# Patient Record
Sex: Female | Born: 1975 | Race: White | Hispanic: No | Marital: Married | Smoking: Former smoker
Health system: Southern US, Community
[De-identification: ages and names within clinical notes are randomized; demographics above are authoritative.]

## PROBLEM LIST (undated history)

## (undated) HISTORY — PX: TUBAL LIGATION: SHX77

## (undated) HISTORY — PX: CARPAL TUNNEL RELEASE: SHX101

## (undated) SURGERY — Surgical Case
Anesthesia: *Unknown

---

## 2019-02-04 ENCOUNTER — Emergency Department
Admission: EM | Admit: 2019-02-04 | Discharge: 2019-02-04 | Disposition: A | Discharge status: Home / Self Care | Payer: Self-pay | Attending: Emergency Medicine | Admitting: Emergency Medicine

## 2019-02-04 ENCOUNTER — Emergency Department: Payer: Self-pay

## 2019-02-04 ENCOUNTER — Other Ambulatory Visit: Payer: Self-pay

## 2019-02-04 DIAGNOSIS — R945 Abnormal results of liver function studies: Secondary | ICD-10-CM | POA: Insufficient documentation

## 2019-02-04 DIAGNOSIS — F17210 Nicotine dependence, cigarettes, uncomplicated: Secondary | ICD-10-CM | POA: Insufficient documentation

## 2019-02-04 DIAGNOSIS — K5900 Constipation, unspecified: Secondary | ICD-10-CM

## 2019-02-04 DIAGNOSIS — R7989 Other specified abnormal findings of blood chemistry: Secondary | ICD-10-CM

## 2019-02-04 LAB — URINE DRUG SCREEN, QUALITATIVE (ARMC ONLY)
Amphetamines, Ur Screen: NOT DETECTED
Barbiturates, Ur Screen: NOT DETECTED
Benzodiazepine, Ur Scrn: NOT DETECTED
Cannabinoid 50 Ng, Ur ~~LOC~~: NOT DETECTED
Cocaine Metabolite,Ur ~~LOC~~: NOT DETECTED
MDMA (Ecstasy)Ur Screen: NOT DETECTED
Methadone Scn, Ur: NOT DETECTED
Opiate, Ur Screen: POSITIVE — AB
Phencyclidine (PCP) Ur S: NOT DETECTED
Tricyclic, Ur Screen: NOT DETECTED

## 2019-02-04 LAB — URINALYSIS, COMPLETE (UACMP) WITH MICROSCOPIC
Bilirubin Urine: NEGATIVE
Glucose, UA: NEGATIVE mg/dL
Hgb urine dipstick: NEGATIVE
Ketones, ur: 5 mg/dL — AB
Nitrite: POSITIVE — AB
Protein, ur: 100 mg/dL — AB
Specific Gravity, Urine: 1.021 (ref 1.005–1.030)
pH: 5 (ref 5.0–8.0)

## 2019-02-04 LAB — CBC WITH DIFFERENTIAL/PLATELET
Abs Immature Granulocytes: 0.01 10*3/uL (ref 0.00–0.07)
Basophils Absolute: 0 10*3/uL (ref 0.0–0.1)
Basophils Relative: 1 %
Eosinophils Absolute: 0 10*3/uL (ref 0.0–0.5)
Eosinophils Relative: 1 %
HCT: 38.2 % (ref 36.0–46.0)
Hemoglobin: 13.6 g/dL (ref 12.0–15.0)
Immature Granulocytes: 0 %
Lymphocytes Relative: 30 %
Lymphs Abs: 1.4 10*3/uL (ref 0.7–4.0)
MCH: 37.9 pg — ABNORMAL HIGH (ref 26.0–34.0)
MCHC: 35.6 g/dL (ref 30.0–36.0)
MCV: 106.4 fL — ABNORMAL HIGH (ref 80.0–100.0)
Monocytes Absolute: 0.2 10*3/uL (ref 0.1–1.0)
Monocytes Relative: 4 %
Neutro Abs: 3.1 10*3/uL (ref 1.7–7.7)
Neutrophils Relative %: 64 %
Platelets: 118 10*3/uL — ABNORMAL LOW (ref 150–400)
RBC: 3.59 MIL/uL — ABNORMAL LOW (ref 3.87–5.11)
RDW: 16.3 % — ABNORMAL HIGH (ref 11.5–15.5)
WBC: 4.8 10*3/uL (ref 4.0–10.5)
nRBC: 0 % (ref 0.0–0.2)

## 2019-02-04 LAB — COMPREHENSIVE METABOLIC PANEL
ALT: 120 U/L — ABNORMAL HIGH (ref 0–44)
AST: 379 U/L — ABNORMAL HIGH (ref 15–41)
Albumin: 4.1 g/dL (ref 3.5–5.0)
Alkaline Phosphatase: 116 U/L (ref 38–126)
Anion gap: 17 — ABNORMAL HIGH (ref 5–15)
BUN: <5 mg/dL — ABNORMAL LOW (ref 6–20)
CO2: 22 mmol/L (ref 22–32)
Calcium: 9.1 mg/dL (ref 8.9–10.3)
Chloride: 96 mmol/L — ABNORMAL LOW (ref 98–111)
Creatinine, Ser: 0.54 mg/dL (ref 0.44–1.00)
GFR calc Af Amer: >60 mL/min (ref >60)
GFR calc non Af Amer: >60 mL/min (ref >60)
Glucose, Bld: 96 mg/dL (ref 70–99)
Potassium: 3.3 mmol/L — ABNORMAL LOW (ref 3.5–5.1)
Sodium: 135 mmol/L (ref 135–145)
Total Bilirubin: 1.5 mg/dL — ABNORMAL HIGH (ref 0.3–1.2)
Total Protein: 7.1 g/dL (ref 6.5–8.1)

## 2019-02-04 LAB — PREGNANCY, URINE: Preg Test, Ur: NEGATIVE

## 2019-02-04 LAB — ETHANOL: Alcohol, Ethyl (B): <10 mg/dL (ref <10)

## 2019-02-04 LAB — LIPASE, BLOOD: Lipase: 29 U/L (ref 11–51)

## 2019-02-04 MED ORDER — POLYETHYLENE GLYCOL 3350 17 G PO PACK: 17.0000 g | PACK | Freq: Every day | ORAL | 0 refills | Status: DC

## 2019-02-04 MED ORDER — SODIUM CHLORIDE 0.9 % IV BOLUS
1000.0000 mL | Freq: Once | INTRAVENOUS | Status: AC
  Administered 2019-02-04: 1000 mL via INTRAVENOUS

## 2019-02-04 MED ORDER — PROMETHAZINE HCL 25 MG/ML IJ SOLN
6.2500 mg | Freq: Once | INTRAMUSCULAR | Status: AC
  Administered 2019-02-04: 6.25 mg via INTRAVENOUS
  Filled 2019-02-04: qty 1

## 2019-02-04 MED ORDER — DOCUSATE SODIUM 100 MG PO CAPS: 100.0000 mg | ORAL_CAPSULE | Freq: Every day | ORAL | 2 refills | Status: DC | PRN

## 2019-02-04 MED ORDER — IOHEXOL 300 MG/ML  SOLN
100.0000 mL | Freq: Once | INTRAMUSCULAR | Status: AC | PRN
  Administered 2019-02-04: 100 mL via INTRAVENOUS

## 2019-02-04 MED ORDER — IOHEXOL 240 MG/ML SOLN
50.0000 mL | Freq: Once | INTRAMUSCULAR | Status: AC
  Administered 2019-02-04: 50 mL via ORAL

## 2019-02-04 MED ORDER — ONDANSETRON HCL 4 MG/2ML IJ SOLN
4.0000 mg | Freq: Once | INTRAMUSCULAR | Status: AC
  Administered 2019-02-04: 4 mg via INTRAVENOUS
  Filled 2019-02-04: qty 2

## 2019-02-04 NOTE — ED Provider Notes (Addendum)
[facility] Emergency Department Provider Note  ____________________________________________   I have reviewed the triage vital signs and the nursing notes. Where available I have reviewed prior notes and, if possible and indicated, outside hospital notes.    HISTORY  Chief Complaint Constipation    HPI Jill Reyes is a 43 y.o. female with a past medical history of C-section x1 and BTL, no other abdominal surgeries, does smoke tobacco, drinks a few times a week, presents today complaining of abdominal pain since Monday.  She states she is been constipated for some time, her last bowel movement on Monday was "hard" however, she has not had a bowel movement since then and she began to have abdominal pain 3 days ago and vomiting last night.  Nonbloody nonbilious vomiting.  She feels this is likely secondary to constipation.  She has not had a fever or chills.  She denies any dysuria or urinary symptoms.  She states she does not have a history of constipation allergies.  She does not take narcotics does not endorse any history of recreational drug use.  No alleviating or aggravating factors.  The abdominal pain is periumbilical infraumbilical, it is a cramping discomfort which is fairly constant for the last couple days.  Nothing makes it better, nothing makes it worse comes and goes on its own, no other alleviating or aggravating factors or prior intervention no history of SBO or colonic obstruction.  No relevant family history patient reports.   History reviewed. No pertinent past medical history.  There are no active problems to display for this patient.   Past Surgical History:  Procedure Laterality Date  . CARPAL TUNNEL RELEASE    . CESAREAN SECTION    . TUBAL LIGATION      Prior to Admission medications   Not on File    Allergies Patient has no known allergies.  No family history on file.  Social History Social History   Tobacco Use  . Smoking  status: Current Every Day Smoker    Types: Cigarettes  . Smokeless tobacco: Never Used  Substance Use Topics  . Alcohol use: Yes  . Drug use: Not Currently    Review of Systems Constitutional: No fever/chills Eyes: No visual changes. ENT: No sore throat. No stiff neck no neck pain Cardiovascular: Denies chest pain. Respiratory: Denies shortness of breath. Gastrointestinal: See HPI genitourinary: Negative for dysuria. Musculoskeletal: Negative lower extremity swelling Skin: Negative for rash. Neurological: Negative for severe headaches, focal weakness or numbness.   ____________________________________________   PHYSICAL EXAM:  VITAL SIGNS: ED Triage Vitals [02/04/19 0709]  Enc Vitals Group     BP (!) 171/116     Pulse Rate (!) 129     Resp 18     Temp 98.4 F (36.9 C)     Temp Source Oral     SpO2 98 %     Weight 153 lb (69.4 kg)     Height 5\' 8"  (1.727 m)     Head Circumference      Peak Flow      Pain Score 7     Pain Loc      Pain Edu?      Excl. in GC?     Constitutional: Alert and oriented. Well appearing and in no acute distress.  Patient resting comfortably in the bed does not appear to be in any distress Eyes: Conjunctivae are normal Head: Atraumatic HEENT: No congestion/rhinnorhea. Mucous membranes are moist.  Oropharynx non-erythematous Neck:  Nontender with no meningismus, no masses, no stridor Cardiovascular: Normal rate, regular rhythm. Grossly normal heart sounds.  Good peripheral circulation. Respiratory: Normal respiratory effort.  No retractions. Lungs CTAB. Abdominal: Soft and umbilical tenderness, mild distention possibly noted not tympanic, soft. No distention. No guarding no rebound Back:  There is no focal tenderness or step off.  there is no midline tenderness there are no lesions noted. there is no CVA tenderness { Musculoskeletal: No lower extremity tenderness, no upper extremity tenderness. No joint effusions, no DVT signs strong  distal pulses no edema Neurologic:  Normal speech and language. No gross focal neurologic deficits are appreciated.  Skin:  Skin is warm, dry and intact. No rash noted. Psychiatric: Mood and affect are normal. Speech and behavior are normal.  ____________________________________________   LABS (all labs ordered are listed, but only abnormal results are displayed)  Labs Reviewed  CBC WITH DIFFERENTIAL/PLATELET  COMPREHENSIVE METABOLIC PANEL  ETHANOL  URINALYSIS, COMPLETE (UACMP) WITH MICROSCOPIC  URINE DRUG SCREEN, QUALITATIVE ([facility] ONLY)  LIPASE, BLOOD  POC URINE PREG, ED    Pertinent labs  results that were available during my care of the patient were reviewed by me and considered in my medical decision making (see chart for details). ____________________________________________  EKG  I personally interpreted any EKGs ordered by me or triage  ____________________________________________  RADIOLOGY  Pertinent labs & imaging results that were available during my care of the patient were reviewed by me and considered in my medical decision making (see chart for details). If possible, patient and/or family made aware of any abnormal findings.  No results found. ____________________________________________    PROCEDURES  Procedure(s) performed: None  Procedures  Critical Care performed: None  ____________________________________________   INITIAL IMPRESSION / ASSESSMENT AND PLAN / ED COURSE  Pertinent labs & imaging results that were available during my care of the patient were reviewed by me and considered in my medical decision making (see chart for details).  Patient here with of abdominal pain, inability to have a bowel movement for 5 days, and now vomiting.  She does have some abdominal tenderness.  Vomiting is certainly not something we generally see in constipation but it certainly can happen.  She is having decreased p.o. as a result of this process.  I am  concerned about the possibility of an SBO, we will obtain imaging and blood work.  ----------------------------------------- 10:25 AM on 02/04/2019 -----------------------------------------  CT scan personally looked at and reviewed, also read radiology review, they do see that she has some questionable inflammation, there is no evidence however of abscess or appendicitis.  She has significant constipation.  We will give her an enema to help her with that.  This is her chief complaint and is the only acute finding really.  She does have low platelets and elevated liver function tests, which I think is more consistent with her chronic alcohol abuse which she states is involves hard liquor and beer on a very regular basis.  I have advised her about seeking help for this.  Her right upper quadrant completely nontender and her real complaint is constipation I do not think she requires further imaging at this time.  We will however refer her to GI for what I suspect is early cirrhotic pathology in her blood work.  Her urine was positive for opiates, which certainly can contribute to constipation.  Initially she told me she did not take any opiates however after being told of her results, she did state that she  had one antiemetic, "Phenergan with codeine" it is not clear the degree to which she uses opiates but it certainly could cause constipation of this Friday and I have advised her to be careful about opiate ingestion.  We will see if she feels better after enema.  There was some inflammation noted on CT scan but I do think antibiotics are indicated at this time.  ----------------------------------------- 11:06 AM on 02/04/2019 -----------------------------------------  Patient had a large and satisfying bowel movement with the assistance of an enema she states she feels "great," and her abdomen is benign.  We will discharge her with close outpatient follow-up and return precautions given and understood.    ____________________________________________   FINAL CLINICAL IMPRESSION(S) / ED DIAGNOSES  Final diagnoses:  None      This chart was dictated using voice recognition software.  Despite best efforts to proofread,  errors can occur which can change meaning.      Schuyler Amor, MD 02/04/19 8242    Schuyler Amor, MD 02/04/19 1027    Schuyler Amor, MD 02/04/19 1027    Schuyler Amor, MD 02/04/19 [phone]

## 2019-02-04 NOTE — ED Notes (Signed)
Pt able to have a medium sized BM and reports feeling much better.

## 2019-02-04 NOTE — ED Triage Notes (Signed)
Pt c/o constipation with feeling full with rectal pain/pressure with nausea and vomiting,. Last BM was Monday. States she has taken a stool softener with no relief.

## 2019-02-04 NOTE — Discharge Instructions (Signed)
We do notice your liver function tests are somewhat elevated here, and your platelets are somewhat low.  We are worried this might be because of your drinking.  We do not suggest to quit drinking cold [place] but we do advise you gradually bring down your alcohol consumption and that you seek help as an outpatient for this as we discussed.  I am referring you to [place] which should help with that.  We are also referring you to GI medicine.  At this time it, is also important to have a primary care doctor please follow-up closely with them as well.  If you have increased pain, fever, vomiting or any other new or worrisome symptoms return to the emergency department.

## 2019-02-07 LAB — URINE CULTURE: Culture: >=100000 — AB

## 2019-12-22 ENCOUNTER — Other Ambulatory Visit: Payer: Self-pay

## 2019-12-22 ENCOUNTER — Inpatient Hospital Stay
Admission: EM | Admit: 2019-12-22 | Discharge: 2019-12-25 | DRG: 854 | Disposition: A | Discharge status: Home / Self Care | Payer: Self-pay | Attending: Internal Medicine | Admitting: Internal Medicine

## 2019-12-22 ENCOUNTER — Emergency Department: Payer: Self-pay

## 2019-12-22 DIAGNOSIS — R000 Tachycardia, unspecified: Secondary | ICD-10-CM | POA: Diagnosis present

## 2019-12-22 DIAGNOSIS — L03317 Cellulitis of buttock: Secondary | ICD-10-CM

## 2019-12-22 DIAGNOSIS — F1010 Alcohol abuse, uncomplicated: Secondary | ICD-10-CM | POA: Diagnosis present

## 2019-12-22 DIAGNOSIS — R630 Anorexia: Secondary | ICD-10-CM | POA: Diagnosis present

## 2019-12-22 DIAGNOSIS — E871 Hypo-osmolality and hyponatremia: Secondary | ICD-10-CM | POA: Diagnosis present

## 2019-12-22 DIAGNOSIS — R6510 Systemic inflammatory response syndrome (SIRS) of non-infectious origin without acute organ dysfunction: Secondary | ICD-10-CM

## 2019-12-22 DIAGNOSIS — D7589 Other specified diseases of blood and blood-forming organs: Secondary | ICD-10-CM | POA: Diagnosis present

## 2019-12-22 DIAGNOSIS — K610 Anal abscess: Secondary | ICD-10-CM | POA: Diagnosis present

## 2019-12-22 DIAGNOSIS — K639 Disease of intestine, unspecified: Secondary | ICD-10-CM

## 2019-12-22 DIAGNOSIS — M479 Spondylosis, unspecified: Secondary | ICD-10-CM | POA: Diagnosis present

## 2019-12-22 DIAGNOSIS — R8271 Bacteriuria: Secondary | ICD-10-CM | POA: Diagnosis present

## 2019-12-22 DIAGNOSIS — A419 Sepsis, unspecified organism: Principal | ICD-10-CM | POA: Diagnosis present

## 2019-12-22 DIAGNOSIS — R7401 Elevation of levels of liver transaminase levels: Secondary | ICD-10-CM | POA: Diagnosis present

## 2019-12-22 DIAGNOSIS — F17210 Nicotine dependence, cigarettes, uncomplicated: Secondary | ICD-10-CM | POA: Diagnosis present

## 2019-12-22 DIAGNOSIS — M4317 Spondylolisthesis, lumbosacral region: Secondary | ICD-10-CM | POA: Diagnosis present

## 2019-12-22 DIAGNOSIS — E538 Deficiency of other specified B group vitamins: Secondary | ICD-10-CM | POA: Diagnosis present

## 2019-12-22 DIAGNOSIS — K643 Fourth degree hemorrhoids: Secondary | ICD-10-CM | POA: Diagnosis present

## 2019-12-22 DIAGNOSIS — Z9851 Tubal ligation status: Secondary | ICD-10-CM

## 2019-12-22 DIAGNOSIS — Z20822 Contact with and (suspected) exposure to covid-19: Secondary | ICD-10-CM | POA: Diagnosis present

## 2019-12-22 DIAGNOSIS — R112 Nausea with vomiting, unspecified: Secondary | ICD-10-CM | POA: Diagnosis present

## 2019-12-22 DIAGNOSIS — E876 Hypokalemia: Secondary | ICD-10-CM | POA: Diagnosis present

## 2019-12-22 DIAGNOSIS — L02215 Cutaneous abscess of perineum: Secondary | ICD-10-CM | POA: Diagnosis present

## 2019-12-22 LAB — CBC WITH DIFFERENTIAL/PLATELET
Abs Immature Granulocytes: 0.03 10*3/uL (ref 0.00–0.07)
Basophils Absolute: 0.1 10*3/uL (ref 0.0–0.1)
Basophils Relative: 0 %
Eosinophils Absolute: 0 10*3/uL (ref 0.0–0.5)
Eosinophils Relative: 0 %
HCT: 37.9 % (ref 36.0–46.0)
Hemoglobin: 13.8 g/dL (ref 12.0–15.0)
Immature Granulocytes: 0 %
Lymphocytes Relative: 23 %
Lymphs Abs: 2.7 10*3/uL (ref 0.7–4.0)
MCH: 36.7 pg — ABNORMAL HIGH (ref 26.0–34.0)
MCHC: 36.4 g/dL — ABNORMAL HIGH (ref 30.0–36.0)
MCV: 100.8 fL — ABNORMAL HIGH (ref 80.0–100.0)
Monocytes Absolute: 0.4 10*3/uL (ref 0.1–1.0)
Monocytes Relative: 4 %
Neutro Abs: 8.5 10*3/uL — ABNORMAL HIGH (ref 1.7–7.7)
Neutrophils Relative %: 73 %
Platelets: 194 10*3/uL (ref 150–400)
RBC: 3.76 MIL/uL — ABNORMAL LOW (ref 3.87–5.11)
RDW: 14.9 % (ref 11.5–15.5)
WBC: 11.7 10*3/uL — ABNORMAL HIGH (ref 4.0–10.5)
nRBC: 0 % (ref 0.0–0.2)

## 2019-12-22 LAB — COMPREHENSIVE METABOLIC PANEL
ALT: 41 U/L (ref 0–44)
AST: 82 U/L — ABNORMAL HIGH (ref 15–41)
Albumin: 3.5 g/dL (ref 3.5–5.0)
Alkaline Phosphatase: 125 U/L (ref 38–126)
Anion gap: 17 — ABNORMAL HIGH (ref 5–15)
BUN: 8 mg/dL (ref 6–20)
CO2: 31 mmol/L (ref 22–32)
Calcium: 9 mg/dL (ref 8.9–10.3)
Chloride: 80 mmol/L — ABNORMAL LOW (ref 98–111)
Creatinine, Ser: 0.44 mg/dL (ref 0.44–1.00)
GFR calc Af Amer: >60 mL/min (ref >60)
GFR calc non Af Amer: >60 mL/min (ref >60)
Glucose, Bld: 100 mg/dL — ABNORMAL HIGH (ref 70–99)
Potassium: 2.4 mmol/L — CL (ref 3.5–5.1)
Sodium: 128 mmol/L — ABNORMAL LOW (ref 135–145)
Total Bilirubin: 1.9 mg/dL — ABNORMAL HIGH (ref 0.3–1.2)
Total Protein: 6.8 g/dL (ref 6.5–8.1)

## 2019-12-22 LAB — LACTIC ACID, PLASMA: Lactic Acid, Venous: 1.7 mmol/L (ref 0.5–1.9)

## 2019-12-22 LAB — MAGNESIUM: Magnesium: 2.1 mg/dL (ref 1.7–2.4)

## 2019-12-22 LAB — URINALYSIS, COMPLETE (UACMP) WITH MICROSCOPIC
Bilirubin Urine: NEGATIVE
Glucose, UA: NEGATIVE mg/dL
Ketones, ur: NEGATIVE mg/dL
Leukocytes,Ua: NEGATIVE
Nitrite: POSITIVE — AB
Protein, ur: NEGATIVE mg/dL
Specific Gravity, Urine: >1.046 — ABNORMAL HIGH (ref 1.005–1.030)
pH: 6 (ref 5.0–8.0)

## 2019-12-22 LAB — HIV ANTIBODY (ROUTINE TESTING W REFLEX): HIV Screen 4th Generation wRfx: NONREACTIVE

## 2019-12-22 MED ORDER — AMOXICILLIN-POT CLAVULANATE 875-125 MG PO TABS
1.0000 | ORAL_TABLET | Freq: Two times a day (BID) | ORAL | Status: DC
  Filled 2019-12-22: qty 1

## 2019-12-22 MED ORDER — VANCOMYCIN HCL IN DEXTROSE 1-5 GM/200ML-% IV SOLN
1000.0000 mg | Freq: Once | INTRAVENOUS | Status: AC
  Administered 2019-12-22: 1000 mg via INTRAVENOUS
  Filled 2019-12-22: qty 200

## 2019-12-22 MED ORDER — POTASSIUM CHLORIDE IN NACL 40-0.9 MEQ/L-% IV SOLN
INTRAVENOUS | Status: DC
  Administered 2019-12-22 – 2019-12-23 (×4): 125 mL/h via INTRAVENOUS
  Filled 2019-12-22 (×7): qty 1000

## 2019-12-22 MED ORDER — LACTATED RINGERS IV BOLUS
1000.0000 mL | Freq: Once | INTRAVENOUS | Status: AC
  Administered 2019-12-22: 1000 mL via INTRAVENOUS

## 2019-12-22 MED ORDER — PIPERACILLIN-TAZOBACTAM 3.375 G IVPB: 3.3750 g | Freq: Three times a day (TID) | INTRAVENOUS | Status: DC

## 2019-12-22 MED ORDER — POTASSIUM CHLORIDE 10 MEQ/100ML IV SOLN
10.0000 meq | INTRAVENOUS | Status: DC
  Administered 2019-12-22 (×2): 10 meq via INTRAVENOUS
  Filled 2019-12-22: qty 100

## 2019-12-22 MED ORDER — ONDANSETRON HCL 4 MG/2ML IJ SOLN: 4.0000 mg | Freq: Four times a day (QID) | INTRAMUSCULAR | Status: DC | PRN

## 2019-12-22 MED ORDER — ONDANSETRON HCL 4 MG/2ML IJ SOLN
4.0000 mg | Freq: Once | INTRAMUSCULAR | Status: AC
  Administered 2019-12-22: 4 mg via INTRAVENOUS
  Filled 2019-12-22 (×2): qty 2

## 2019-12-22 MED ORDER — PIPERACILLIN-TAZOBACTAM 3.375 G IVPB 30 MIN
3.3750 g | Freq: Once | INTRAVENOUS | Status: AC
  Administered 2019-12-22: 3.375 g via INTRAVENOUS
  Filled 2019-12-22: qty 50

## 2019-12-22 MED ORDER — MORPHINE SULFATE (PF) 4 MG/ML IV SOLN
4.0000 mg | INTRAVENOUS | Status: DC | PRN
  Administered 2019-12-22 – 2019-12-23 (×5): 4 mg via INTRAVENOUS
  Filled 2019-12-22 (×5): qty 1

## 2019-12-22 MED ORDER — SODIUM CHLORIDE 0.9 % IV BOLUS
1000.0000 mL | Freq: Once | INTRAVENOUS | Status: AC
  Administered 2019-12-22: 1000 mL via INTRAVENOUS

## 2019-12-22 MED ORDER — SODIUM CHLORIDE 0.9 % IV SOLN: 2.0000 g | Freq: Once | INTRAVENOUS | Status: DC

## 2019-12-22 MED ORDER — OXYCODONE-ACETAMINOPHEN 5-325 MG PO TABS
1.0000 | ORAL_TABLET | ORAL | Status: DC | PRN
  Administered 2019-12-22: 1 via ORAL
  Filled 2019-12-22: qty 1

## 2019-12-22 MED ORDER — IOHEXOL 300 MG/ML  SOLN
100.0000 mL | Freq: Once | INTRAMUSCULAR | Status: AC | PRN
  Administered 2019-12-22: 100 mL via INTRAVENOUS
  Filled 2019-12-22: qty 100

## 2019-12-22 MED ORDER — AMOXICILLIN-POT CLAVULANATE 875-125 MG PO TABS: 1.0000 | ORAL_TABLET | Freq: Two times a day (BID) | ORAL | Status: DC

## 2019-12-22 MED ORDER — ENOXAPARIN SODIUM 40 MG/0.4ML ~~LOC~~ SOLN
40.0000 mg | SUBCUTANEOUS | Status: DC
  Administered 2019-12-22 – 2019-12-23 (×2): 40 mg via SUBCUTANEOUS
  Filled 2019-12-22 (×2): qty 0.4

## 2019-12-22 MED ORDER — NICOTINE 14 MG/24HR TD PT24
14.0000 mg | MEDICATED_PATCH | Freq: Every day | TRANSDERMAL | Status: DC
  Administered 2019-12-22 – 2019-12-25 (×4): 14 mg via TRANSDERMAL
  Filled 2019-12-22 (×4): qty 1

## 2019-12-22 MED ORDER — DIBUCAINE (PERIANAL) 1 % EX OINT
TOPICAL_OINTMENT | Freq: Once | CUTANEOUS | Status: AC
  Administered 2019-12-22: 1 via RECTAL
  Filled 2019-12-22: qty 28

## 2019-12-22 MED ORDER — MAGNESIUM SULFATE 2 GM/50ML IV SOLN
2.0000 g | Freq: Once | INTRAVENOUS | Status: AC
  Administered 2019-12-22: 2 g via INTRAVENOUS
  Filled 2019-12-22: qty 50

## 2019-12-22 NOTE — ED Notes (Signed)
Date and time results received: 12/22/19 5:09 PM  Test: potassium Critical Value: 2.4  Name of Provider Notified: Dr. Roxan Hockey  Orders Received? Or Actions Taken?: no new orders at this time

## 2019-12-22 NOTE — ED Notes (Signed)
Pt was given a meal tray before leaving ED

## 2019-12-22 NOTE — Consult Note (Signed)
PHARMACY -  BRIEF ANTIBIOTIC NOTE   Pharmacy has received consult(s) for vancomycin from an ED provider. The patient's profile has been reviewed for ht/wt/allergies/indication/available labs. NKDA  Patient is a 44 y/o F presenting with concerns for rectal abscess with drainage  One time order(s) placed for  -Vancomycin 1 g (already ordered)  Further antibiotics/pharmacy consults should be ordered by admitting physician if indicated.                       Thank you, Tressie Ellis  Pharmacy Resident 12/22/2019  6:21 PM

## 2019-12-22 NOTE — ED Notes (Signed)
Wallace Cullens top on ice and 1 set of blood cultures sent with labs per EDP.

## 2019-12-22 NOTE — ED Triage Notes (Addendum)
Pt was sent from PCP today with concerns for a rectal abscess , states she has been irritated there for the past 2 months, worse in the past 5 days. States it is draining. Pt has printed lab results from [facility], last name is different from [facility] so the records did not carry over in epic.

## 2019-12-22 NOTE — Consult Note (Deleted)
Pharmacy Antibiotic Note  Jill Reyes is a 44 y.o. female admitted on 12/22/2019 with perianal abscess with drainage.  Pharmacy has been consulted for pip/tazo dosing. Patient also received vancomycin 1 g x 1 in the ED.  WBC 11.7. Afebrile.   Plan: Zosyn 3.375g IV q8h (4 hour infusion).  Height: 5\' 8"  (172.7 cm) Weight: 53.5 kg (118 lb) IBW/kg (Calculated) : 63.9  Temp (24hrs), Avg:98.3 F (36.8 C), Min:98.3 F (36.8 C), Max:98.3 F (36.8 C)  Recent Labs  Lab 12/22/19 1629  WBC 11.7*  CREATININE 0.44  LATICACIDVEN 1.7    Estimated Creatinine Clearance: 77.4 mL/min (by C-G formula based on SCr of 0.44 mg/dL).    No Known Allergies  Antimicrobials this admission: 4/23 Vancomycin 1 g IV x 1 Pip/tazo 4/23 >>   Dose adjustments this admission: n/a  Microbiology results: 4/23 BCx: pending 4/23 UCx: pending   Thank you for allowing pharmacy to be a part of this patient's care.  5/23  Pharmacy Resident 12/22/2019 8:01 PM

## 2019-12-22 NOTE — ED Notes (Signed)
Lab called to add on lactic acid

## 2019-12-22 NOTE — H&P (Signed)
History and Physical    Jill Reyes HDQ:[id number] DOB: Apr 12, 1976 DOA: 12/22/2019  PCP: Patient, No Pcp Per  Patient coming from: home   Chief Complaint: perirectal pain  HPI: Jill Reyes is a 44 y.o. female with medical history significant for alcohol abuse and tobacco abuse presents with above.  First time with this problem. Symptoms began 2 months ago. Inititally intermittent perirectal pain with defecation that came and went. For the past 2 weeks has had constant pain right side of rectum, worse with defecation. Pain has worsened. Over the last several days severe pain with accompanying nausea/vomiting (nbnb). No diarrhea. Has had no appetite and has been eating/drinking little. 2-3 days ago the swollen area near rectum began draining dark bloody/brown fluid. Denies fever. No abd pain. No chest pain or SOB. Gets no regular medical care. Never had a colonoscopy. Smokes 3/4 ppd, drank at least 4 drinks nightly until a week ago.  ED Course: fluids, mg, vanc/zosyn, pain medication, zofran, k 10 meq, CT pelvis.  Review of Systems: As per HPI otherwise 10 point review of systems negative.    History reviewed. No pertinent past medical history.  Past Surgical History:  Procedure Laterality Date  . CARPAL TUNNEL RELEASE    . CESAREAN SECTION    . TUBAL LIGATION       reports that she has been smoking cigarettes. She has never used smokeless tobacco. She reports current alcohol use. She reports previous drug use.  No Known Allergies  No family history on file.  Prior to Admission medications   Not on File    Physical Exam: Vitals:   12/22/19 1440 12/22/19 1441 12/22/19 1506  BP: (!) 148/108  (!) 153/103  Pulse: (!) 140  (!) 116  Resp: 18  14  Temp: 98.3 F (36.8 C)    TempSrc: Oral    SpO2: 99%  100%  Weight:  53.5 kg   Height:  5\' 8"  (1.727 m)     Constitutional: No acute distress Head: Atraumatic Eyes: Conjunctiva clear ENM: Moist mucous membranes. Normal  dentition.  Neck: Supple Respiratory: Clear to auscultation bilaterally, no wheezing/rales/rhonchi. Normal respiratory effort. No accessory muscle use. . Cardiovascular: Regular rate and rhythm. No murmurs/rubs/gallops. Abdomen: Non-tender, non-distended. No masses. No rebound or guarding. Positive bowel sounds. Musculoskeletal: No joint deformity upper and lower extremities. Normal ROM, no contractures. Normal muscle tone.  Skin: No rashes, lesions, or ulcers.  Extremities: No peripheral edema. Palpable peripheral pulses. Neurologic: Alert, moving all 4 extremities. Psychiatric: Normal insight and judgement. Anal: tenderness and induration right ischial area a couple of centimeters from anus, draining. No appreciable fluctuance.   Labs on Admission: I have personally reviewed following labs and imaging studies  CBC: Recent Labs  Lab 12/22/19 1629  WBC 11.7*  NEUTROABS 8.5*  HGB 13.8  HCT 37.9  MCV 100.8*  PLT 194   Basic Metabolic Panel: Recent Labs  Lab 12/22/19 1629  NA 128*  K 2.4*  CL 80*  CO2 31  GLUCOSE 100*  BUN 8  CREATININE 0.44  CALCIUM 9.0  MG 2.1   GFR: Estimated Creatinine Clearance: 77.4 mL/min (by C-G formula based on SCr of 0.44 mg/dL). Liver Function Tests: Recent Labs  Lab 12/22/19 1629  AST 82*  ALT 41  ALKPHOS 125  BILITOT 1.9*  PROT 6.8  ALBUMIN 3.5   No results for input(s): LIPASE, AMYLASE in the last 168 hours. No results for input(s): AMMONIA in the last 168 hours. Coagulation Profile: No  results for input(s): INR, PROTIME in the last 168 hours. Cardiac Enzymes: No results for input(s): CKTOTAL, CKMB, CKMBINDEX, TROPONINI in the last 168 hours. BNP (last 3 results) No results for input(s): PROBNP in the last 8760 hours. HbA1C: No results for input(s): HGBA1C in the last 72 hours. CBG: No results for input(s): GLUCAP in the last 168 hours. Lipid Profile: No results for input(s): CHOL, HDL, LDLCALC, TRIG, CHOLHDL, LDLDIRECT in  the last 72 hours. Thyroid Function Tests: No results for input(s): TSH, T4TOTAL, FREET4, T3FREE, THYROIDAB in the last 72 hours. Anemia Panel: No results for input(s): VITAMINB12, FOLATE, FERRITIN, TIBC, IRON, RETICCTPCT in the last 72 hours. Urine analysis:    Component Value Date/Time   COLORURINE AMBER (A) 12/22/2019 1806   APPEARANCEUR HAZY (A) 12/22/2019 1806   LABSPEC >1.046 (H) 12/22/2019 1806   PHURINE 6.0 12/22/2019 1806   GLUCOSEU NEGATIVE 12/22/2019 1806   HGBUR LARGE (A) 12/22/2019 1806   BILIRUBINUR NEGATIVE 12/22/2019 1806   KETONESUR NEGATIVE 12/22/2019 1806   PROTEINUR NEGATIVE 12/22/2019 1806   NITRITE POSITIVE (A) 12/22/2019 1806   LEUKOCYTESUR NEGATIVE 12/22/2019 1806    Radiological Exams on Admission: CT PELVIS W CONTRAST  Result Date: 12/22/2019 CLINICAL DATA:  Perianal and perirectal pain, concern for rectal abscess EXAM: CT PELVIS WITH CONTRAST TECHNIQUE: Multidetector CT imaging of the pelvis was performed using the standard protocol following the bolus administration of intravenous contrast. CONTRAST:  168mL OMNIPAQUE IOHEXOL 300 MG/ML  SOLN COMPARISON:  02/04/2019 FINDINGS: Urinary Tract: Punctate foci of gas within the bladder lumen may reflect recent catheterization. Visualized portions of the kidneys and ureters are unremarkable. Bowel: No bowel obstruction or ileus. There is chronic nonspecific wall thickening of the ascending colon. This could reflect chronic colitis or scarring from previous bouts of inflammation. Colonoscopy recommended if not recently performed. There is a small focus of enhancement in the right anterolateral perianal soft tissues measuring approximately 1.7 x 0.9 cm, reference image 47. This is just distal to the anal verge. This is compatible with localized phlegmon, without fluid collection or drainable abscess. Vascular/Lymphatic: Minimal atherosclerosis. No pathologic adenopathy. Reproductive:  No mass or other significant abnormality  Other:  No free fluid or free gas. Musculoskeletal: There are no acute or destructive bony lesions. 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 IMPRESSION: 1. Small localized area of enhancement in the right perianal soft tissues consistent with localized phlegmon. No fluid collection or drainable abscess. 2. Chronic wall thickening of the ascending colon and cecum. I suspect chronic inflammation or scarring. Colonoscopy recommended if not previously performed. 3. Punctate foci of gas within the bladder lumen, suspect recent catheterization. Electronically Signed   By: Randa Ngo M.D.   On: 12/22/2019 17:57    EKG: pending  Assessment/Plan Active Problems:   Perianal abscess   Hypokalemia   Hyponatremia   Macrocytosis   Elevated AST (SGOT)   Colon wall thickening   # Perianal abscess - 2 months worsening symptoms, now spontaneously draining. With surrounding cellulitis. Meets sepsis criteria w/ tachycardia, leukocytosis. But hemodynamically stable. CT shows nothing drainable. Receive vanc/zosyn in ED. CT also showing ascending colon wall thickening. Possible Crohn's? - start augmentin - monitor, if no improvement consider gen surg referral for I/D - outpt GI referral for colonoscopy - morphine, zofran - s/p 1 L, IV fluids ordered, tolerating PO  # Bacteriuria - denies cystitis symptoms, suspect this a contaminant from draining perianal abscess -  f/u culture, monitor symptoms  # Hypokalemia - suspect 2/2 vomiting and decreased PO. Asymptomatic - ekg pending, will also place on telemetry - f/u mg level, ed gave 2 mg - 40 meq with fluids @ 125  # Hyponatremia - subacute, 128, normal a year ago. Suspect 2/2 dehydration - IV fluids as above  # macrocytosis  # AST elevation - suspect 2/2 chronic etoh abuse - f/u b12/folate - f/u hbv/hiv/hcv  # Tobacco abuse - patch  DVT prophylaxis: lovenox Code Status:  full  Family Communication: husband John  Disposition Plan: tbd  Consults called: none  Admission status: med/surg    Silvano Bilis MD [facility] Pager [phone]  If 7PM-7AM, please contact night-coverage [email or web address] Password [facility]  12/22/2019, 7:40 PM

## 2019-12-22 NOTE — ED Provider Notes (Signed)
[facility] Emergency Department Provider Note    First MD Initiated Contact with Patient 12/22/19 1622     (approximate)  I have reviewed the triage vital signs and the nursing notes.   HISTORY  Chief Complaint Rectal Pain and Abscess    HPI Jill Reyes is a 44 y.o. female below listed past medical history presents to the ER for evaluation what was initially some irritation around her rectum and noting some intermittent blood location presenting now with worsening severe pain and discomfort.  Not having any fevers.  Denies any dysuria.  Was seen [facility] was sent over to the ER due to concern for perirectal perianal abscess.  No history of abscesses.  No history of diabetes.  Is not been on any antibiotics.    History reviewed. No pertinent past medical history. No family history on file. Past Surgical History:  Procedure Laterality Date  . CARPAL TUNNEL RELEASE    . CESAREAN SECTION    . TUBAL LIGATION     There are no problems to display for this patient.     Prior to Admission medications   Medication Sig Start Date End Date Taking? Authorizing Provider  docusate sodium (COLACE) 100 MG capsule Take 1 capsule (100 mg total) by mouth daily as needed. 02/04/19 02/04/20  Jeanmarie Plant, MD  polyethylene glycol (MIRALAX) 17 g packet Take 17 g by mouth daily. 02/04/19   Jeanmarie Plant, MD    Allergies Patient has no known allergies.    Social History Social History   Tobacco Use  . Smoking status: Current Every Day Smoker    Types: Cigarettes  . Smokeless tobacco: Never Used  Substance Use Topics  . Alcohol use: Yes  . Drug use: Not Currently    Review of Systems Patient denies headaches, rhinorrhea, blurry vision, numbness, shortness of breath, chest pain, edema, cough, abdominal pain, nausea, vomiting, diarrhea, dysuria, fevers, rashes or hallucinations unless otherwise stated above in  HPI. ____________________________________________   PHYSICAL EXAM:  VITAL SIGNS: Vitals:   12/22/19 1440 12/22/19 1506  BP: (!) 148/108 (!) 153/103  Pulse: (!) 140 (!) 116  Resp: 18 14  Temp: 98.3 F (36.8 C)   SpO2: 99% 100%    Constitutional: Alert and oriented.  Eyes: Conjunctivae are normal.  Head: Atraumatic. Nose: No congestion/rhinnorhea. Mouth/Throat: Mucous membranes are moist.   Neck: No stridor. Painless ROM.  Cardiovascular: Normal rate, regular rhythm. Grossly normal heart sounds.  Good peripheral circulation. Respiratory: Normal respiratory effort.  No retractions. Lungs CTAB. Gastrointestinal: Soft and nontender. No distention. No abdominal bruits. No CVA tenderness. Genitourinary: There is.  Rectal tenderness but no appreciate any significant fluctuance.  There is an area of surrounding cellulitis around 1/2 cm area that is draining purulent fluid of the left perianal area.  No surrounding crepitus. no vaginal discharge.  There are nonthrombosed but tender hemorrhoids noted. Musculoskeletal: No lower extremity tenderness nor edema.  No joint effusions. Neurologic:  Normal speech and language. No gross focal neurologic deficits are appreciated. No facial droop Skin:  Skin is warm, dry and intact. No rash noted. Psychiatric: Mood and affect are normal. Speech and behavior are normal.  ____________________________________________   LABS (all labs ordered are listed, but only abnormal results are displayed)  Results for orders placed or performed during the hospital encounter of 12/22/19 (from the past 24 hour(s))  CBC with Differential/Platelet     Status: Abnormal   Collection Time: 12/22/19  4:29 PM  Result Value Ref Range   WBC 11.7 (H) 4.0 - 10.5 K/uL   RBC 3.76 (L) 3.87 - 5.11 MIL/uL   Hemoglobin 13.8 12.0 - 15.0 g/dL   HCT 33.2 95.1 - 88.4 %   MCV 100.8 (H) 80.0 - 100.0 fL   MCH 36.7 (H) 26.0 - 34.0 pg   MCHC 36.4 (H) 30.0 - 36.0 g/dL   RDW 16.6 06.3  - 01.6 %   Platelets 194 150 - 400 K/uL   nRBC 0.0 0.0 - 0.2 %   Neutrophils Relative % 73 %   Neutro Abs 8.5 (H) 1.7 - 7.7 K/uL   Lymphocytes Relative 23 %   Lymphs Abs 2.7 0.7 - 4.0 K/uL   Monocytes Relative 4 %   Monocytes Absolute 0.4 0.1 - 1.0 K/uL   Eosinophils Relative 0 %   Eosinophils Absolute 0.0 0.0 - 0.5 K/uL   Basophils Relative 0 %   Basophils Absolute 0.1 0.0 - 0.1 K/uL   Immature Granulocytes 0 %   Abs Immature Granulocytes 0.03 0.00 - 0.07 K/uL  Comprehensive metabolic panel     Status: Abnormal   Collection Time: 12/22/19  4:29 PM  Result Value Ref Range   Sodium 128 (L) 135 - 145 mmol/L   Potassium 2.4 (LL) 3.5 - 5.1 mmol/L   Chloride 80 (L) 98 - 111 mmol/L   CO2 31 22 - 32 mmol/L   Glucose, Bld 100 (H) 70 - 99 mg/dL   BUN 8 6 - 20 mg/dL   Creatinine, Ser 0.10 0.44 - 1.00 mg/dL   Calcium 9.0 8.9 - 93.2 mg/dL   Total Protein 6.8 6.5 - 8.1 g/dL   Albumin 3.5 3.5 - 5.0 g/dL   AST 82 (H) 15 - 41 U/L   ALT 41 0 - 44 U/L   Alkaline Phosphatase 125 38 - 126 U/L   Total Bilirubin 1.9 (H) 0.3 - 1.2 mg/dL   GFR calc non Af Amer >60 >60 mL/min   GFR calc Af Amer >60 >60 mL/min   Anion gap 17 (H) 5 - 15  Lactic acid, plasma     Status: None   Collection Time: 12/22/19  4:29 PM  Result Value Ref Range   Lactic Acid, Venous 1.7 0.5 - 1.9 mmol/L  Urinalysis, Complete w Microscopic     Status: Abnormal   Collection Time: 12/22/19  6:06 PM  Result Value Ref Range   Color, Urine AMBER (A) YELLOW   APPearance HAZY (A) CLEAR   Specific Gravity, Urine >1.046 (H) 1.005 - 1.030   pH 6.0 5.0 - 8.0   Glucose, UA NEGATIVE NEGATIVE mg/dL   Hgb urine dipstick LARGE (A) NEGATIVE   Bilirubin Urine NEGATIVE NEGATIVE   Ketones, ur NEGATIVE NEGATIVE mg/dL   Protein, ur NEGATIVE NEGATIVE mg/dL   Nitrite POSITIVE (A) NEGATIVE   Leukocytes,Ua NEGATIVE NEGATIVE   RBC / HPF 0-5 0 - 5 RBC/hpf   WBC, UA 0-5 0 - 5 WBC/hpf   Bacteria, UA MANY (A) NONE SEEN   Squamous Epithelial / LPF  0-5 0 - 5   Mucus PRESENT    ____________________________________________ ____________________________________________  RADIOLOGY  I personally reviewed all radiographic images ordered to evaluate for the above acute complaints and reviewed radiology reports and findings.  These findings were personally discussed with the patient.  Please see medical record for radiology report.  ____________________________________________   PROCEDURES  Procedure(s) performed:  Procedures    Critical Care performed: no ____________________________________________   INITIAL IMPRESSION /  ASSESSMENT AND PLAN / ED COURSE  Pertinent labs & imaging results that were available during my care of the patient were reviewed by me and considered in my medical decision making (see chart for details).   DDX:.  Perirectal abscess  Anal abscess, fistula, cellulitis, neck fashion, Fournier's gangrene, sepsis   DALA BREAULT is a 44 y.o. who presents to the ED with symptoms as described above.  Patient tachycardic not febrile but very uncomfortable appearing.  Exam as above.  Somewhat concerning for abscess cellulitis or deep space infection.  Will order CT imaging.  Will order IV fluids as well as IV pain medication.  Clinical Course as of Dec 22 1854  Fri Dec 22, 2019  1844 On examination she does have a crepitus in the area.  It is actively draining.  Per CT there is no evidence of deep space infection or abscess.  There does appear to be cellulitis and phlegmon.  Patient has been started night IV antibiotics.  She does have a low-grade temperature white count and tachycardia therefore she did receive IV antibiotics.  Cultures have been ordered and sent.  Fortunately her lactate is normal.  Does appear to be significantly dehydrated with spec gravity at > 1.046.  Given her hypokalemia hyponatremia tachycardia and presentation I do think that she requires hospitalization for IV medications and reassessment.   Will discuss with hospitalist   [PR]    Clinical Course User Index [PR] Merlyn Lot, MD    The patient was evaluated in Emergency Department today for the symptoms described in the history of present illness. He/she was evaluated in the context of the global COVID-19 pandemic, which necessitated consideration that the patient might be at risk for infection with the SARS-CoV-2 virus that causes COVID-19. Institutional protocols and algorithms that pertain to the evaluation of patients at risk for COVID-19 are in a state of rapid change based on information released by regulatory bodies including the CDC and federal and state organizations. These policies and algorithms were followed during the patient's care in the ED.  As part of my medical decision making, I reviewed the following data within the [facility] notes reviewed and incorporated, Labs reviewed, notes from prior ED visits and [place] Controlled Substance Database   ____________________________________________   FINAL CLINICAL IMPRESSION(S) / ED DIAGNOSES  Final diagnoses:  Perianal abscess  Cellulitis of buttock  Hyponatremia  Hypokalemia  SIRS (systemic inflammatory response syndrome) ([place])      NEW MEDICATIONS STARTED DURING THIS VISIT:  New Prescriptions   No medications on file     Note:  This document was prepared using Dragon voice recognition software and may include unintentional dictation errors.    Merlyn Lot, MD 12/22/19 [phone]

## 2019-12-23 DIAGNOSIS — E871 Hypo-osmolality and hyponatremia: Secondary | ICD-10-CM

## 2019-12-23 DIAGNOSIS — E876 Hypokalemia: Secondary | ICD-10-CM

## 2019-12-23 DIAGNOSIS — K610 Anal abscess: Secondary | ICD-10-CM

## 2019-12-23 LAB — COMPREHENSIVE METABOLIC PANEL
ALT: 32 U/L (ref 0–44)
AST: 71 U/L — ABNORMAL HIGH (ref 15–41)
Albumin: 2.6 g/dL — ABNORMAL LOW (ref 3.5–5.0)
Alkaline Phosphatase: 104 U/L (ref 38–126)
Anion gap: 10 (ref 5–15)
BUN: <5 mg/dL — ABNORMAL LOW (ref 6–20)
CO2: 29 mmol/L (ref 22–32)
Calcium: 7.7 mg/dL — ABNORMAL LOW (ref 8.9–10.3)
Chloride: 90 mmol/L — ABNORMAL LOW (ref 98–111)
Creatinine, Ser: 0.42 mg/dL — ABNORMAL LOW (ref 0.44–1.00)
GFR calc Af Amer: >60 mL/min (ref >60)
GFR calc non Af Amer: >60 mL/min (ref >60)
Glucose, Bld: 98 mg/dL (ref 70–99)
Potassium: 2.9 mmol/L — ABNORMAL LOW (ref 3.5–5.1)
Sodium: 129 mmol/L — ABNORMAL LOW (ref 135–145)
Total Bilirubin: 1.5 mg/dL — ABNORMAL HIGH (ref 0.3–1.2)
Total Protein: 5.3 g/dL — ABNORMAL LOW (ref 6.5–8.1)

## 2019-12-23 LAB — CBC
HCT: 31.8 % — ABNORMAL LOW (ref 36.0–46.0)
Hemoglobin: 11.2 g/dL — ABNORMAL LOW (ref 12.0–15.0)
MCH: 37.7 pg — ABNORMAL HIGH (ref 26.0–34.0)
MCHC: 35.2 g/dL (ref 30.0–36.0)
MCV: 107.1 fL — ABNORMAL HIGH (ref 80.0–100.0)
Platelets: 146 10*3/uL — ABNORMAL LOW (ref 150–400)
RBC: 2.97 MIL/uL — ABNORMAL LOW (ref 3.87–5.11)
RDW: 15.2 % (ref 11.5–15.5)
WBC: 8.6 10*3/uL (ref 4.0–10.5)
nRBC: 0 % (ref 0.0–0.2)

## 2019-12-23 LAB — SARS CORONAVIRUS 2 (TAT 6-24 HRS): SARS Coronavirus 2: NEGATIVE

## 2019-12-23 LAB — TSH: TSH: 1.864 u[IU]/mL (ref 0.350–4.500)

## 2019-12-23 LAB — SODIUM: Sodium: 133 mmol/L — ABNORMAL LOW (ref 135–145)

## 2019-12-23 LAB — POTASSIUM: Potassium: 4.1 mmol/L (ref 3.5–5.1)

## 2019-12-23 LAB — OSMOLALITY, URINE: Osmolality, Ur: 313 mOsm/kg (ref 300–900)

## 2019-12-23 LAB — CORTISOL: Cortisol, Plasma: 13.4 ug/dL

## 2019-12-23 LAB — OSMOLALITY: Osmolality: 269 mOsm/kg — ABNORMAL LOW (ref 275–295)

## 2019-12-23 LAB — MAGNESIUM: Magnesium: 2.8 mg/dL — ABNORMAL HIGH (ref 1.7–2.4)

## 2019-12-23 LAB — VITAMIN B12: Vitamin B-12: 839 pg/mL (ref 180–914)

## 2019-12-23 LAB — HEPATITIS B SURFACE ANTIGEN: Hepatitis B Surface Ag: NONREACTIVE

## 2019-12-23 LAB — HEMOGLOBIN A1C
Hgb A1c MFr Bld: 4.6 % — ABNORMAL LOW (ref 4.8–5.6)
Mean Plasma Glucose: 85.32 mg/dL

## 2019-12-23 LAB — SODIUM, URINE, RANDOM: Sodium, Ur: 42 mmol/L

## 2019-12-23 LAB — FOLATE: Folate: 1.8 ng/mL — ABNORMAL LOW (ref >5.9)

## 2019-12-23 MED ORDER — POTASSIUM CHLORIDE CRYS ER 20 MEQ PO TBCR: 40.0000 meq | EXTENDED_RELEASE_TABLET | ORAL | Status: DC

## 2019-12-23 MED ORDER — BISACODYL 5 MG PO TBEC: 5.0000 mg | DELAYED_RELEASE_TABLET | Freq: Every day | ORAL | Status: DC | PRN

## 2019-12-23 MED ORDER — OXYCODONE HCL 5 MG PO TABS: 5.0000 mg | ORAL_TABLET | ORAL | Status: DC | PRN

## 2019-12-23 MED ORDER — POTASSIUM CHLORIDE CRYS ER 20 MEQ PO TBCR
40.0000 meq | EXTENDED_RELEASE_TABLET | Freq: Once | ORAL | Status: AC
  Administered 2019-12-23: 40 meq via ORAL
  Filled 2019-12-23: qty 2

## 2019-12-23 MED ORDER — SENNA 8.6 MG PO TABS
1.0000 | ORAL_TABLET | Freq: Every day | ORAL | Status: DC | PRN
  Administered 2019-12-23 – 2019-12-24 (×2): 8.6 mg via ORAL
  Filled 2019-12-23 (×2): qty 1

## 2019-12-23 MED ORDER — POTASSIUM CHLORIDE CRYS ER 20 MEQ PO TBCR
40.0000 meq | EXTENDED_RELEASE_TABLET | ORAL | Status: AC
  Administered 2019-12-23 (×2): 40 meq via ORAL
  Filled 2019-12-23 (×2): qty 2

## 2019-12-23 MED ORDER — FOLIC ACID 1 MG PO TABS
1.0000 mg | ORAL_TABLET | Freq: Every day | ORAL | Status: DC
  Administered 2019-12-23 – 2019-12-25 (×3): 1 mg via ORAL
  Filled 2019-12-23 (×3): qty 1

## 2019-12-23 MED ORDER — SODIUM CHLORIDE 0.9 % IV SOLN
1.0000 g | INTRAVENOUS | Status: DC
  Administered 2019-12-23 – 2019-12-24 (×2): 1 g via INTRAVENOUS
  Filled 2019-12-23: qty 10
  Filled 2019-12-23: qty 1

## 2019-12-23 MED ORDER — OXYCODONE HCL 5 MG PO TABS
10.0000 mg | ORAL_TABLET | ORAL | Status: DC | PRN
  Administered 2019-12-23 – 2019-12-25 (×12): 10 mg via ORAL
  Filled 2019-12-23 (×12): qty 2

## 2019-12-23 MED ORDER — MAGNESIUM CITRATE PO SOLN
1.0000 | Freq: Once | ORAL | Status: DC | PRN
  Filled 2019-12-23 (×3): qty 296

## 2019-12-23 MED ORDER — MORPHINE SULFATE (PF) 4 MG/ML IV SOLN
4.0000 mg | INTRAVENOUS | Status: DC | PRN
  Administered 2019-12-24 – 2019-12-25 (×4): 4 mg via INTRAVENOUS
  Filled 2019-12-23 (×5): qty 1

## 2019-12-23 MED ORDER — ALUM & MAG HYDROXIDE-SIMETH 200-200-20 MG/5ML PO SUSP
30.0000 mL | Freq: Four times a day (QID) | ORAL | Status: DC | PRN
  Administered 2019-12-23 – 2019-12-24 (×2): 30 mL via ORAL
  Filled 2019-12-23 (×2): qty 30

## 2019-12-23 NOTE — Progress Notes (Addendum)
PROGRESS NOTE    Jill Reyes   VOH:[id number]  DOB: 08/27/1976  PCP: Patient, No Pcp Per    DOA: 12/22/2019 LOS: 1   Brief Narrative   Jill Reyes is a 44 y.o. female with medical history significant for alcohol abuse and tobacco abuse presented to the ED on 12/22/19 with worsening perianal pain that started two months prior and progressively worsened over two weeks until quite severe.  Associated nausea/vomiting due to pain, poor appetite, localized swelling and drainage of dark bloody/brown fluid.  No fever/chills.  Does not follow with healthcare providers.  In the ED, hypertensive 148/108 and tachycardic HR 140 initially (improved to 116), other vitals stable.  Labs notable for hyponatremia 128, hypokalemia 2.4, AST 82, Tbili 1.9, WBC's 11.7k, macrocytosis.  UA negative for infection.  CT pelvis showed a small localized area of enhancement in the right perianal soft tissues consistent with localized phlegmon. No fluid collection or drainable abscess, chronic wall thickening of the ascending colon and cecum (suspected to be chronic inflammation or scarring, colonoscopy recommended if not previously performed).  She was treated with broad spectrum antiobiotcs, K replaced.  Admitted to hospitalist service for perianal cellulitis with possible abscess.     Assessment & Plan   Principal Problem:   Perianal abscess Active Problems:   Hypokalemia   Hyponatremia   Colon wall thickening   Macrocytosis   Elevated AST (SGOT)  Sepsis secondary Perianal cellulitis with ?abscess - present on admission, sepsis as evidenced by leukocytosis and tachycardia in setting of cellulitis/abscess.  Normal lactate.  Fluids and broad spectrum antibiotics started in ED.  Vitals now stable.  Leukocytosis resolved. --continue IV Rocephin --possibly can transition to PO tomorrow depending on improvement --monitor area  --will consider general surgery consult if not improving, to consider I&D attempt (no  fluid collection seen on CT). --pain control per orders, stool softeners --cleanse area with warm soap and water at least twice daily --follow up on cultures  Hypokalemia - present on admission K, 2.4.  Replaced & resolved. --monitor BMP  Hypotonic Hyponatremia - present on admission Na 128 (normal a year ago).  Normal TSH rules out hypothyroidism.  Cortisol pending to check adrenals.  Hypotonic serum, hypertonic urine and high urine sodium.  Differential includes SIADH, stress, drugs.  Less likely beer potomania as urine sodium high. --repeat Na pending for afternoon, likely will stop fluids and restrict fluids vs give Lasix --BMP in AM  Asymptomatic Bacteruria - no UTI symptoms, will defer antibiotics.  Follow urine culture. Monitor for symptoms.  Colon wall thickening - seen on CT pelvis on admission. Outpatient follow-up with GI for colonoscopy recommended if not done previously.   Macrocytosis - without anemia.  Likely due to EtOH.  B12 level normal.  Folate pending.  Monitor CBC.  Elevated AST - 82 on admission, suspect due to EtOH.  Monitor.  Tobacco abuse - nicotine patch.  Counseled patient on importance of smoking cessation.  Patient BMI: Body mass index is 18.54 kg/m.   DVT prophylaxis: Lovenox  Diet:  Diet Orders (From admission, onward)    Start     Ordered   12/22/19 2038  Diet regular Room service appropriate? Yes; Fluid consistency: Thin  Diet effective now    Question Answer Comment  Room service appropriate? Yes   Fluid consistency: Thin      12/22/19 2038            Code Status: Full Code    Subjective 12/23/19  Patient seen with significant other at bedside this AM.  She reports feeling better since admission.  Still has a lot of pain, but has been able to get more comfortable and pain medicine helping.  Denies fever/chills.   Disposition Plan & Communication   Dispo & Barriers: d/c home pending further clinical improvement and transition to PO  antibiotics.  Possible surgery evaluation for ?I&D if not improving. Coming from: home Exp d/c date: 4/25 Medically stable for d/c? no  Family Communication: signficant other at bedside during encounter    Consults, Procedures, Significant Events   Consultants:   none  Procedures:   none  Antimicrobials:   Vanc/Zosyn given in the ED 4/23  Rocephin 4/24 >>   Objective   Vitals:   12/23/19 0019 12/23/19 0433 12/23/19 0927 12/23/19 1226  BP: 127/87 (!) 129/99 (!) 119/91 (!) 139/100  Pulse: 91 85 92 84  Resp: 16 18 18 18   Temp: 97.7 F (36.5 C) 97.9 F (36.6 C) 98.1 F (36.7 C) 97.9 F (36.6 C)  TempSrc: Oral Oral Oral Oral  SpO2: 100% 100% 100% 100%  Weight:      Height:        Intake/Output Summary (Last 24 hours) at 12/23/2019 1353 Last data filed at 12/23/2019 0947 Gross per 24 hour  Intake 672.1 ml  Output 2100 ml  Net -1427.9 ml   Filed Weights   12/22/19 1441 12/22/19 2038  Weight: 53.5 kg 55.3 kg    Physical Exam:  General exam: awake, alert, no acute distress HEENT: moist mucus membranes, hearing grossly normal  Respiratory system: CTAB, no wheezes, rales or rhonchi, normal respiratory effort. Cardiovascular system: normal S1/S2, RRR, no JVD, murmurs, rubs, gallops, no pedal edema.   Gastrointestinal system: soft, NT, ND, rectal exam deferred per patient request. Central nervous system: A&O x4. no gross focal neurologic deficits, normal speech Extremities: moves all, no edema, normal tone Skin: dry, intact, normal temperature, normal color Psychiatry: normal mood, congruent affect, judgement and insight appear normal  Labs   Data Reviewed: I have personally reviewed following labs and imaging studies  CBC: Recent Labs  Lab 12/22/19 1629 12/22/19 2045 12/23/19 0422  WBC 11.7* 12.7* 8.6  NEUTROABS 8.5*  --   --   HGB 13.8 12.4 11.2*  HCT 37.9 35.5* 31.8*  MCV 100.8* 105.0* 107.1*  PLT 194 160 146*   Basic Metabolic Panel: Recent Labs   Lab 12/22/19 1629 12/22/19 2045 12/23/19 0422 12/23/19 1320  NA 128*  --  129*  --   K 2.4*  --  2.9* 4.1  CL 80*  --  90*  --   CO2 31  --  29  --   GLUCOSE 100*  --  98  --   BUN 8  --  <5*  --   CREATININE 0.44 0.44 0.42*  --   CALCIUM 9.0  --  7.7*  --   MG 2.1  --  2.8*  --    GFR: Estimated Creatinine Clearance: 80 mL/min (A) (by C-G formula based on SCr of 0.42 mg/dL (L)). Liver Function Tests: Recent Labs  Lab 12/22/19 1629 12/23/19 0422  AST 82* 71*  ALT 41 32  ALKPHOS 125 104  BILITOT 1.9* 1.5*  PROT 6.8 5.3*  ALBUMIN 3.5 2.6*   No results for input(s): LIPASE, AMYLASE in the last 168 hours. No results for input(s): AMMONIA in the last 168 hours. Coagulation Profile: No results for input(s): INR, PROTIME in the last 168 hours.  Cardiac Enzymes: No results for input(s): CKTOTAL, CKMB, CKMBINDEX, TROPONINI in the last 168 hours. BNP (last 3 results) No results for input(s): PROBNP in the last 8760 hours. HbA1C: Recent Labs    12/23/19 0422  HGBA1C 4.6*   CBG: No results for input(s): GLUCAP in the last 168 hours. Lipid Profile: No results for input(s): CHOL, HDL, LDLCALC, TRIG, CHOLHDL, LDLDIRECT in the last 72 hours. Thyroid Function Tests: Recent Labs    12/23/19 1043  TSH 1.864   Anemia Panel: Recent Labs    12/23/19 0422  VITAMINB12 839  FOLATE 1.8*   Sepsis Labs: Recent Labs  Lab 12/22/19 1629  LATICACIDVEN 1.7    Recent Results (from the past 240 hour(s))  Blood culture (routine x 2)     Status: None (Preliminary result)   Collection Time: 12/22/19  4:29 PM   Specimen: BLOOD  Result Value Ref Range Status   Specimen Description BLOOD LEFT FA  Final   Special Requests   Final    BOTTLES DRAWN AEROBIC AND ANAEROBIC Blood Culture results may not be optimal due to an excessive volume of blood received in culture bottles   Culture   Final    NO GROWTH < 12 HOURS Performed at [facility], [address]  [place], [place] [address]    Report Status PENDING  Incomplete  Blood culture (routine x 2)     Status: None (Preliminary result)   Collection Time: 12/22/19  7:37 PM   Specimen: BLOOD  Result Value Ref Range Status   Specimen Description BLOOD RIGHT ASSIST CONTROL  Final   Special Requests   Final    BOTTLES DRAWN AEROBIC AND ANAEROBIC Blood Culture results may not be optimal due to an inadequate volume of blood received in culture bottles   Culture   Final    NO GROWTH < 12 HOURS Performed at [facility], [address] [place], [place] [address]    Report Status PENDING  Incomplete  SARS CORONAVIRUS 2 (TAT 6-24 HRS) Nasopharyngeal Nasopharyngeal Swab     Status: None   Collection Time: 12/22/19  7:37 PM   Specimen: Nasopharyngeal Swab  Result Value Ref Range Status   SARS Coronavirus 2 NEGATIVE NEGATIVE Final    Comment: (NOTE) SARS-CoV-2 target nucleic acids are NOT DETECTED. The SARS-CoV-2 RNA is generally detectable in upper and lower respiratory specimens during the acute phase of infection. Negative results do not preclude SARS-CoV-2 infection, do not rule out co-infections with other pathogens, and should not be used as the sole basis for treatment or other patient management decisions. Negative results must be combined with clinical observations, patient history, and epidemiological information. The expected result is Negative. Fact Sheet for Patients: HairSlick.no Fact Sheet for Healthcare Providers: quierodirigir.com This test is not yet approved or cleared by the [place] FDA and  has been authorized for detection and/or diagnosis of SARS-CoV-2 by FDA under an Emergency Use Authorization (EUA). This EUA will remain  in effect (meaning this test can be used) for the duration of the COVID-19 declaration under Section 56 4(b)(1) of the Act, 21 U.S.C. section 360bbb-3(b)(1), unless the authorization is  terminated or revoked sooner. Performed at [facility], [address], [place], [place] [address]       Imaging Studies   CT PELVIS W CONTRAST  Result Date: 12/22/2019 CLINICAL DATA:  Perianal and perirectal pain, concern for rectal abscess EXAM: CT PELVIS WITH CONTRAST TECHNIQUE: Multidetector CT imaging of the pelvis  was performed using the standard protocol following the bolus administration of intravenous contrast. CONTRAST:  OMNIPAQUE IOHEXOL 300 MG/ML  SOLN COMPARISON:  02/04/2019 FINDINGS: Urinary Tract: Punctate foci of gas within the bladder lumen may reflect recent catheterization. Visualized portions of the kidneys and ureters are unremarkable. Bowel: No bowel obstruction or ileus. There is chronic nonspecific wall thickening of the ascending colon. This could reflect chronic colitis or scarring from previous bouts of inflammation. Colonoscopy recommended if not recently performed. There is a small focus of enhancement in the right anterolateral perianal soft tissues measuring approximately 1.7 x 0.9 cm, reference image 47. This is just distal to the anal verge. This is compatible with localized phlegmon, without fluid collection or drainable abscess. Vascular/Lymphatic: Minimal atherosclerosis. No pathologic adenopathy. Reproductive:  No mass or other significant abnormality Other:  No free fluid or free gas. Musculoskeletal: There are no acute or destructive bony lesions. 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 IMPRESSION: 1. Small localized area of enhancement in the right perianal soft tissues consistent with localized phlegmon. No fluid collection or drainable abscess. 2. Chronic wall thickening of the ascending colon and cecum. I suspect chronic inflammation or scarring. Colonoscopy recommended if not previously performed. 3. Punctate foci of gas within the bladder lumen, suspect recent  catheterization. Electronically Signed   By: Sharlet Salina M.D.   On: 12/22/2019 17:57     Medications   Scheduled Meds: . enoxaparin (LOVENOX) injection  40 mg Subcutaneous Q24H  . folic acid  1 mg Oral Daily  . nicotine  14 mg Transdermal Daily   Continuous Infusions: . 0.9 % NaCl with KCl 40 mEq / L 125 mL/hr (12/23/19 0738)  . cefTRIAXone (ROCEPHIN)  IV 1 g (12/23/19 1018)       LOS: 1 day    Time spent: 30 minutes    Pennie Banter, DO [facility]   If 7PM-7AM, please contact night-coverage [email or web address] 12/23/2019, 1:53 PM

## 2019-12-23 NOTE — Hospital Course (Signed)
Jill Reyes is a 44 y.o. female with medical history significant for alcohol abuse and tobacco abuse presented to the ED on 12/22/19 with worsening perianal pain that started two months prior and progressively worsened over two weeks until quite severe.  Associated nausea/vomiting due to pain, poor appetite, localized swelling and drainage of dark bloody/brown fluid.  No fever/chills.  Does not follow with healthcare providers.  In the ED, hypertensive 148/108 and tachycardic HR 140 initially (improved to 116), other vitals stable.  Labs notable for hyponatremia 128, hypokalemia 2.4, AST 82, Tbili 1.9, WBC's 11.7k, macrocytosis.  UA negative for infection.  CT pelvis showed a small localized area of enhancement in the right perianal soft tissues consistent with localized phlegmon. No fluid collection or drainable abscess, chronic wall thickening of the ascending colon and cecum (suspected to be chronic inflammation or scarring, colonoscopy recommended if not previously performed).  She was treated with broad spectrum antiobiotcs, K replaced.  Admitted to hospitalist service for perianal cellulitis with possible abscess.

## 2019-12-24 DIAGNOSIS — E538 Deficiency of other specified B group vitamins: Secondary | ICD-10-CM | POA: Diagnosis present

## 2019-12-24 LAB — CBC WITH DIFFERENTIAL/PLATELET
Abs Immature Granulocytes: 0.05 10*3/uL (ref 0.00–0.07)
Basophils Absolute: 0 10*3/uL (ref 0.0–0.1)
Basophils Relative: 0 %
Eosinophils Absolute: 0.1 10*3/uL (ref 0.0–0.5)
Eosinophils Relative: 1 %
HCT: 35.6 % — ABNORMAL LOW (ref 36.0–46.0)
Hemoglobin: 11.9 g/dL — ABNORMAL LOW (ref 12.0–15.0)
Immature Granulocytes: 1 %
Lymphocytes Relative: 23 %
Lymphs Abs: 2 10*3/uL (ref 0.7–4.0)
MCH: 37.1 pg — ABNORMAL HIGH (ref 26.0–34.0)
MCHC: 33.4 g/dL (ref 30.0–36.0)
MCV: 110.9 fL — ABNORMAL HIGH (ref 80.0–100.0)
Monocytes Absolute: 0.3 10*3/uL (ref 0.1–1.0)
Monocytes Relative: 4 %
Neutro Abs: 6.4 10*3/uL (ref 1.7–7.7)
Neutrophils Relative %: 71 %
Platelets: 153 10*3/uL (ref 150–400)
RBC: 3.21 MIL/uL — ABNORMAL LOW (ref 3.87–5.11)
RDW: 15.8 % — ABNORMAL HIGH (ref 11.5–15.5)
Smear Review: NORMAL
WBC: 8.9 10*3/uL (ref 4.0–10.5)
nRBC: 0 % (ref 0.0–0.2)

## 2019-12-24 LAB — COMPREHENSIVE METABOLIC PANEL
ALT: 36 U/L (ref 0–44)
AST: 93 U/L — ABNORMAL HIGH (ref 15–41)
Albumin: 2.9 g/dL — ABNORMAL LOW (ref 3.5–5.0)
Alkaline Phosphatase: 110 U/L (ref 38–126)
Anion gap: 7 (ref 5–15)
BUN: <5 mg/dL — ABNORMAL LOW (ref 6–20)
CO2: 23 mmol/L (ref 22–32)
Calcium: 7.9 mg/dL — ABNORMAL LOW (ref 8.9–10.3)
Chloride: 101 mmol/L (ref 98–111)
Creatinine, Ser: <0.3 mg/dL — ABNORMAL LOW (ref 0.44–1.00)
Glucose, Bld: 90 mg/dL (ref 70–99)
Potassium: 5.4 mmol/L — ABNORMAL HIGH (ref 3.5–5.1)
Sodium: 131 mmol/L — ABNORMAL LOW (ref 135–145)
Total Bilirubin: 1.1 mg/dL (ref 0.3–1.2)
Total Protein: 5.6 g/dL — ABNORMAL LOW (ref 6.5–8.1)

## 2019-12-24 MED ORDER — SODIUM CHLORIDE 0.9 % IV SOLN: INTRAVENOUS | Status: DC

## 2019-12-24 MED ORDER — LORATADINE 10 MG PO TABS
10.0000 mg | ORAL_TABLET | Freq: Every day | ORAL | Status: DC
  Administered 2019-12-24 – 2019-12-25 (×2): 10 mg via ORAL
  Filled 2019-12-24 (×2): qty 1

## 2019-12-24 MED ORDER — TRAZODONE HCL 50 MG PO TABS
50.0000 mg | ORAL_TABLET | Freq: Every day | ORAL | Status: DC
  Administered 2019-12-24: 50 mg via ORAL
  Filled 2019-12-24: qty 1

## 2019-12-24 MED ORDER — HYDROCORTISONE (PERIANAL) 2.5 % EX CREA
TOPICAL_CREAM | Freq: Four times a day (QID) | CUTANEOUS | Status: DC
  Filled 2019-12-24: qty 28.35

## 2019-12-24 MED ORDER — AMOXICILLIN-POT CLAVULANATE 875-125 MG PO TABS
1.0000 | ORAL_TABLET | Freq: Two times a day (BID) | ORAL | Status: DC
  Administered 2019-12-25: 1 via ORAL
  Filled 2019-12-24: qty 1

## 2019-12-24 NOTE — H&P (View-Only) (Signed)
Subjective:   CC: perineal abscess and hemorrhoid  HPI:  Jill Reyes is a 44 y.o. female who was consulted by Denton Lank for evaluation of above.  First noted a few days ago.  Symptoms include: Pain is sharp, TTP.  Exacerbated by touch.  Alleviated by nothing specific.  Associated with some drainage from area.  Similar pain reported with hemorrhoids.  She had chronic issues, with occasional bleeding, worse with hard stools.  She feels hemorrhoid discomfort also worse over past several days.     Past Medical History: none reported  Past Surgical History:  has a past surgical history that includes Cesarean section; Tubal ligation; and Carpal tunnel release.  Family History: reviewed and not relevant to CC  Social History:  reports that she has been smoking cigarettes. She has never used smokeless tobacco. She reports current alcohol use. She reports previous drug use.  Current Medications:  No medications prior to admission.    Allergies:  No Known Allergies  ROS:  General: Denies weight loss, weight gain, fatigue, fevers, chills, and night sweats. Eyes: Denies blurry vision, double vision, eye pain, itchy eyes, and tearing. Ears: Denies hearing loss, earache, and ringing in ears. Nose: Denies sinus pain, congestion, infections, runny nose, and nosebleeds. Mouth/throat: Denies hoarseness, sore throat, bleeding gums, and difficulty swallowing. Heart: Denies chest pain, palpitations, racing heart, irregular heartbeat, leg pain or swelling, and decreased activity tolerance. Respiratory: Denies breathing difficulty, shortness of breath, wheezing, cough, and sputum. GI: Denies change in appetite, heartburn, nausea, vomiting, constipation, diarrhea, and blood in stool. GU: Denies difficulty urinating, pain with urinating, urgency, frequency, blood in urine. Musculoskeletal: Denies joint stiffness, pain, swelling, muscle weakness. Skin: Denies rash, itching, mass, tumors, sores, and  boils Neurologic: Denies headache, fainting, dizziness, seizures, numbness, and tingling. Psychiatric: Denies depression, anxiety, difficulty sleeping, and memory loss. Endocrine: Denies heat or cold intolerance, and increased thirst or urination. Blood/lymph: Denies easy bruising, easy bruising, and swollen glands     Objective:     BP (!) 143/104 (BP Location: Left Arm)   Pulse 97   Temp 98.3 F (36.8 C) (Oral)   Resp 18   Ht 5\' 8"  (1.727 m)   Wt 55.3 kg   LMP 12/09/2019 (Approximate) Comment: tubal ligation  SpO2 100%   BMI 18.54 kg/m   Constitutional :  alert, cooperative and appears stated age  Lymphatics/Throat:  no asymmetry, masses, or scars  Respiratory:  clear to auscultation bilaterally  Cardiovascular:  regular rate and rhythm  Gastrointestinal: soft, non-tender; bowel sounds normal; no masses,  no organomegaly.  Musculoskeletal: Steady gait and movement  Skin: Cool and moist  Psychiatric: Normal affect, non-agitated, not confused  Genita/rectal: Chaperone present for exam.  Right perineal area lateral to labia with slight dark discoloration and pinpoint opening with no active drainage, erythema, or induration but extreme TTP.  Swollen, possibly thrombosed TTP hemorrhoid noted in RP aspect.    LABS:  CMP Latest Ref Rng & Units 12/24/2019 12/23/2019 12/23/2019  Glucose 70 - 99 mg/dL 90 - 98  BUN 6 - 20 mg/dL 12/25/2019) - <6(S)  Creatinine 0.44 - 1.00 mg/dL <3(M) - <1.96(Q)  Sodium 135 - 145 mmol/L 131(L) 133(L) 129(L)  Potassium 3.5 - 5.1 mmol/L 5.4(H) 4.1 2.9(L)  Chloride 98 - 111 mmol/L 101 - 90(L)  CO2 22 - 32 mmol/L 23 - 29  Calcium 8.9 - 10.3 mg/dL 7.9(L) - 7.7(L)  Total Protein 6.5 - 8.1 g/dL 2.29(N) - 5.3(L)  Total Bilirubin 0.3 - 1.2  mg/dL 1.1 - 1.5(H)  Alkaline Phos 38 - 126 U/L 110 - 104  AST 15 - 41 U/L 93(H) - 71(H)  ALT 0 - 44 U/L 36 - 32   CBC Latest Ref Rng & Units 12/24/2019 12/23/2019 12/22/2019  WBC 4.0 - 10.5 K/uL 8.9 8.6 12.7(H)  Hemoglobin 12.0  - 15.0 g/dL 11.9(L) 11.2(L) 12.4  Hematocrit 36.0 - 46.0 % 35.6(L) 31.8(L) 35.5(L)  Platelets 150 - 400 K/uL 153 146(L) 160    RADS: CLINICAL DATA:  Perianal and perirectal pain, concern for rectal abscess  EXAM: CT PELVIS WITH CONTRAST  TECHNIQUE: Multidetector CT imaging of the pelvis was performed using the standard protocol following the bolus administration of intravenous contrast.  CONTRAST:  149mL OMNIPAQUE IOHEXOL 300 MG/ML  SOLN  COMPARISON:  02/04/2019  FINDINGS: Urinary Tract: Punctate foci of gas within the bladder lumen may reflect recent catheterization. Visualized portions of the kidneys and ureters are unremarkable.  Bowel: No bowel obstruction or ileus. There is chronic nonspecific wall thickening of the ascending colon. This could reflect chronic colitis or scarring from previous bouts of inflammation. Colonoscopy recommended if not recently performed.  There is a small focus of enhancement in the right anterolateral perianal soft tissues measuring approximately 1.7 x 0.9 cm, reference image 47. This is just distal to the anal verge. This is compatible with localized phlegmon, without fluid collection or drainable abscess.  Vascular/Lymphatic: Minimal atherosclerosis. No pathologic adenopathy.  Reproductive:  No mass or other significant abnormality  Other:  No free fluid or free gas.  Musculoskeletal: There are no acute or destructive bony lesions. 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  IMPRESSION: 1. Small localized area of enhancement in the right perianal soft tissues consistent with localized phlegmon. No fluid collection or drainable abscess. 2. Chronic wall thickening of the ascending colon and cecum. I suspect chronic inflammation or scarring. Colonoscopy recommended if not previously performed. 3. Punctate foci of gas within the bladder lumen,  suspect recent catheterization.   Electronically Signed   By: Randa Ngo M.D.   On: 12/22/2019 17:57  Assessment:     perineal pain, thrombosed internal stage IV hemorhoid   Plan:     1.not sure if there is a drainable fluid collection, but due to extensive pain, worth undergoing formal EUA, possible hemorrhoidectomy, possible I&D or perineal area in the OR.  Alternatives include continued observation with abx.  Benefits include possible symptom relief, pathologic evaluation,  Discussed the risk of surgery including recurrence, chronic pain, post-op infxn, poor cosmesis, poor/delayed wound healing, and possible re-operation to address said risks. The risks of general anesthetic, if used, includes MI, CVA, sudden death or even reaction to anesthetic medications also discussed.  Typical post-op recovery time of 3-5 days with possible activity restrictions were also discussed.  The patient verbalized understanding and all questions were answered to the patient's satisfaction.  She wises to proceed with surgical management.

## 2019-12-24 NOTE — Progress Notes (Addendum)
PROGRESS NOTE    Jill Reyes   TJQ:[id number]  DOB: Jan 14, 1976  PCP: Patient, No Pcp Per    DOA: 12/22/2019 LOS: 2   Brief Narrative   Jill Reyes is a 44 y.o. female with medical history significant for alcohol abuse and tobacco abuse presented to the ED on 12/22/19 with worsening perianal pain that started two months prior and progressively worsened over two weeks until quite severe.  Associated nausea/vomiting due to pain, poor appetite, localized swelling and drainage of dark bloody/brown fluid.  No fever/chills.  Does not follow with healthcare providers.  In the ED, hypertensive 148/108 and tachycardic HR 140 initially (improved to 116), other vitals stable.  Labs notable for hyponatremia 128, hypokalemia 2.4, AST 82, Tbili 1.9, WBC's 11.7k, macrocytosis.  UA negative for infection.  CT pelvis showed a small localized area of enhancement in the right perianal soft tissues consistent with localized phlegmon. No fluid collection or drainable abscess, chronic wall thickening of the ascending colon and cecum (suspected to be chronic inflammation or scarring, colonoscopy recommended if not previously performed).  She was treated with broad spectrum antiobiotcs, K replaced.  Admitted to hospitalist service for perianal cellulitis with possible abscess.    Assessment & Plan   Principal Problem:   Perianal abscess Active Problems:   Hypokalemia   Hyponatremia   Colon wall thickening   Macrocytosis   Elevated AST (SGOT)   Folate deficiency  Sepsis secondary Perianal cellulitis with ?abscess - present on admission, sepsis as evidenced by leukocytosis and tachycardia in setting of cellulitis/abscess.  Normal lactate.  Fluids and broad spectrum antibiotics started in ED.  Vitals now stable.  Leukocytosis resolved. --transition to Augmentin tomorrow, d/c Rocephin --monitor area for fluctuance, worsening erythema etc --general surgery consulted  --pain control per orders, stool  softeners --cleanse area with warm soap and water at least twice daily --follow up on cultures  Hemorrhoids - with bleeding and significant pain.   --General surgery consulted, appreciate assitance.  --stool softeners and pain control --Anusol cream   Hypokalemia - present on admission K, 2.4.  Replaced & resolved.  Now 5.4, overcorrected. --stop LR with K --recheck BMP in AM  Hypotonic Hyponatremia - present on admission Na 128 (normal a year ago).  Improved with LR.  Normal TSH rules out hypothyroidism.  Cortisol also normal.  Hypotonic serum, hypertonic urine and high urine sodium.  Differential includes SIADH, stress, drugs.  Less likely beer potomania as urine sodium high. --BMP in AM  Asymptomatic Bacteruria - no UTI symptoms so no need to treat, but already on antibiotics for above.  Follow urine culture.  Monitor for symptoms.  Colon wall thickening - seen on CT pelvis on admission. Outpatient follow-up with GI for colonoscopy recommended if not done previously.   Folate Deficiency with Macrocytosis - not anemic.  Likely due to EtOH and poor nutrition.   --folic acid 1 mg PO daily --monitor CBC --counsel regarding nutrition  Elevated AST - 82 on admission, suspect due to EtOH.  Monitor.  Tobacco abuse - nicotine patch.  Counseled patient on importance of smoking cessation.  Patient BMI: Body mass index is 18.54 kg/m.   DVT prophylaxis: Lovenox  Diet:  Diet Orders (From admission, onward)    Start     Ordered   12/22/19 2038  Diet regular Room service appropriate? Yes; Fluid consistency: Thin  Diet effective now    Question Answer Comment  Room service appropriate? Yes   Fluid consistency: Thin  12/22/19 2038            Code Status: Full Code    Subjective 12/24/19    Patient seen at bedside this AM.  She reports only 3 hours sleep in past several days due to pain.  Had exacerbated pain overnight due to delay in medication and trying to have BM.  Had  bleeding with this as well.  Unable to get comfortable, states has never had pain this bad.     Disposition Plan & Communication   Dispo & Barriers: d/c home pending further clinical improvement, evaluation by surgery and transition to PO antibiotics.  Patient has uncontrolled pain and pending evaluation by general surgery, continues to require hospital care at this time. Coming from: home Exp d/c date: 4/26 Medically stable for d/c? no  Family Communication: signficant other at bedside during encounter    Consults, Procedures, Significant Events   Consultants:   General surgery  Procedures:   none  Antimicrobials:   Vanc/Zosyn given in the ED 4/23  Rocephin 4/24 >>   Objective   Vitals:   12/23/19 0927 12/23/19 1226 12/23/19 1937 12/24/19 0429  BP: (!) 119/91 (!) 139/100 132/90 (!) 140/99  Pulse: 92 84 96 88  Resp: 18 18 16 16   Temp: 98.1 F (36.7 C) 97.9 F (36.6 C) 98.2 F (36.8 C) 98.1 F (36.7 C)  TempSrc: Oral Oral Oral Oral  SpO2: 100% 100% 100% 100%  Weight:      Height:        Intake/Output Summary (Last 24 hours) at 12/24/2019 1133 Last data filed at 12/24/2019 0930 Gross per 24 hour  Intake 2055.43 ml  Output 0 ml  Net 2055.43 ml   Filed Weights   12/22/19 1441 12/22/19 2038  Weight: 53.5 kg 55.3 kg    Physical Exam:  General exam: awake, alert, appears in uncomfortable and in pain Respiratory system: CTAB, normal respiratory effort. Cardiovascular system: normal S1/S2, RRR, no pedal edema.   Gastrointestinal system: soft, NT, ND, protruding hemorrhoids very tender to touch but without surround swelling or erythema, not currently bleeding. Central nervous system: A&O x4. no gross focal neurologic deficits, normal speech Extremities: moves all, no edema, normal tone Psychiatry: normal mood, congruent affect  Labs   Data Reviewed: I have personally reviewed following labs and imaging studies  CBC: Recent Labs  Lab 12/22/19 1629  12/22/19 2045 12/23/19 0422 12/24/19 0624  WBC 11.7* 12.7* 8.6 8.9  NEUTROABS 8.5*  --   --  6.4  HGB 13.8 12.4 11.2* 11.9*  HCT 37.9 35.5* 31.8* 35.6*  MCV 100.8* 105.0* 107.1* 110.9*  PLT 194 160 146* 836   Basic Metabolic Panel: Recent Labs  Lab 12/22/19 1629 12/22/19 2045 12/23/19 0422 12/23/19 1320 12/24/19 0624  NA 128*  --  129* 133* 131*  K 2.4*  --  2.9* 4.1 5.4*  CL 80*  --  90*  --  101  CO2 31  --  29  --  23  GLUCOSE 100*  --  98  --  90  BUN 8  --  <5*  --  <5*  CREATININE 0.44 0.44 0.42*  --  <0.30*  CALCIUM 9.0  --  7.7*  --  7.9*  MG 2.1  --  2.8*  --   --    GFR: CrCl cannot be calculated (This lab value cannot be used to calculate CrCl because it is not a number: <0.30). Liver Function Tests: Recent Labs  Lab 12/22/19 1629  12/23/19 0422 12/24/19 0624  AST 82* 71* 93*  ALT 41 32 36  ALKPHOS 125 104 110  BILITOT 1.9* 1.5* 1.1  PROT 6.8 5.3* 5.6*  ALBUMIN 3.5 2.6* 2.9*   No results for input(s): LIPASE, AMYLASE in the last 168 hours. No results for input(s): AMMONIA in the last 168 hours. Coagulation Profile: No results for input(s): INR, PROTIME in the last 168 hours. Cardiac Enzymes: No results for input(s): CKTOTAL, CKMB, CKMBINDEX, TROPONINI in the last 168 hours. BNP (last 3 results) No results for input(s): PROBNP in the last 8760 hours. HbA1C: Recent Labs    12/23/19 0422  HGBA1C 4.6*   CBG: No results for input(s): GLUCAP in the last 168 hours. Lipid Profile: No results for input(s): CHOL, HDL, LDLCALC, TRIG, CHOLHDL, LDLDIRECT in the last 72 hours. Thyroid Function Tests: Recent Labs    12/23/19 1043  TSH 1.864   Anemia Panel: Recent Labs    12/23/19 0422  VITAMINB12 839  FOLATE 1.8*   Sepsis Labs: Recent Labs  Lab 12/22/19 1629  LATICACIDVEN 1.7    Recent Results (from the past 240 hour(s))  Blood culture (routine x 2)     Status: None (Preliminary result)   Collection Time: 12/22/19  4:29 PM   Specimen:  BLOOD  Result Value Ref Range Status   Specimen Description BLOOD LEFT FA  Final   Special Requests   Final    BOTTLES DRAWN AEROBIC AND ANAEROBIC Blood Culture results may not be optimal due to an excessive volume of blood received in culture bottles   Culture   Final    NO GROWTH 2 DAYS Performed at [facility], [address] [place], [place] [address]    Report Status PENDING  Incomplete  Urine Culture     Status: Abnormal (Preliminary result)   Collection Time: 12/22/19  6:06 PM   Specimen: Urine, Random  Result Value Ref Range Status   Specimen Description   Final    URINE, RANDOM Performed at [facility], [address] [place], [place] [address]    Special Requests   Final    NONE Performed at [facility], [address], [place], [place] [address]    Culture (A)  Final    >=100,000 COLONIES/mL ESCHERICHIA COLI SUSCEPTIBILITIES TO FOLLOW Performed at [facility], [address] [place], [place] [address]    Report Status PENDING  Incomplete  Blood culture (routine x 2)     Status: None (Preliminary result)   Collection Time: 12/22/19  7:37 PM   Specimen: BLOOD  Result Value Ref Range Status   Specimen Description BLOOD RIGHT ASSIST CONTROL  Final   Special Requests   Final    BOTTLES DRAWN AEROBIC AND ANAEROBIC Blood Culture results may not be optimal due to an inadequate volume of blood received in culture bottles   Culture   Final    NO GROWTH 2 DAYS Performed at [facility], [address], [place], [place] [address]    Report Status PENDING  Incomplete  SARS CORONAVIRUS 2 (TAT 6-24 HRS) Nasopharyngeal Nasopharyngeal Swab     Status: None   Collection Time: 12/22/19  7:37 PM   Specimen: Nasopharyngeal Swab  Result Value Ref Range Status   SARS Coronavirus 2 NEGATIVE NEGATIVE Final    Comment: (NOTE) SARS-CoV-2 target nucleic acids are NOT DETECTED. The SARS-CoV-2 RNA is generally detectable in upper and  lower respiratory specimens during the acute phase of infection. Negative  results do not preclude SARS-CoV-2 infection, do not rule out co-infections with other pathogens, and should not be used as the sole basis for treatment or other patient management decisions. Negative results must be combined with clinical observations, patient history, and epidemiological information. The expected result is Negative. Fact Sheet for Patients: HairSlick.no Fact Sheet for Healthcare Providers: quierodirigir.com This test is not yet approved or cleared by the [place] FDA and  has been authorized for detection and/or diagnosis of SARS-CoV-2 by FDA under an Emergency Use Authorization (EUA). This EUA will remain  in effect (meaning this test can be used) for the duration of the COVID-19 declaration under Section 56 4(b)(1) of the Act, 21 U.S.C. section 360bbb-3(b)(1), unless the authorization is terminated or revoked sooner. Performed at [facility], [address], [place], [place] [address]       Imaging Studies   CT PELVIS W CONTRAST  Result Date: 12/22/2019 CLINICAL DATA:  Perianal and perirectal pain, concern for rectal abscess EXAM: CT PELVIS WITH CONTRAST TECHNIQUE: Multidetector CT imaging of the pelvis was performed using the standard protocol following the bolus administration of intravenous contrast. CONTRAST:  OMNIPAQUE IOHEXOL 300 MG/ML  SOLN COMPARISON:  02/04/2019 FINDINGS: Urinary Tract: Punctate foci of gas within the bladder lumen may reflect recent catheterization. Visualized portions of the kidneys and ureters are unremarkable. Bowel: No bowel obstruction or ileus. There is chronic nonspecific wall thickening of the ascending colon. This could reflect chronic colitis or scarring from previous bouts of inflammation. Colonoscopy recommended if not recently performed. There is a small focus of enhancement in the  right anterolateral perianal soft tissues measuring approximately 1.7 x 0.9 cm, reference image 47. This is just distal to the anal verge. This is compatible with localized phlegmon, without fluid collection or drainable abscess. Vascular/Lymphatic: Minimal atherosclerosis. No pathologic adenopathy. Reproductive:  No mass or other significant abnormality Other:  No free fluid or free gas. Musculoskeletal: There are no acute or destructive bony lesions. 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 IMPRESSION: 1. Small localized area of enhancement in the right perianal soft tissues consistent with localized phlegmon. No fluid collection or drainable abscess. 2. Chronic wall thickening of the ascending colon and cecum. I suspect chronic inflammation or scarring. Colonoscopy recommended if not previously performed. 3. Punctate foci of gas within the bladder lumen, suspect recent catheterization. Electronically Signed   By: Sharlet Salina M.D.   On: 12/22/2019 17:57     Medications   Scheduled Meds: . [START ON 12/25/2019] amoxicillin-clavulanate  1 tablet Oral Q12H  . enoxaparin (LOVENOX) injection  40 mg Subcutaneous Q24H  . folic acid  1 mg Oral Daily  . hydrocortisone   Rectal QID  . loratadine  10 mg Oral Daily  . nicotine  14 mg Transdermal Daily  . traZODone  50 mg Oral QHS   Continuous Infusions:      LOS: 2 days    Time spent: 25 minutes    Pennie Banter, DO [facility]   If 7PM-7AM, please contact night-coverage [email or web address] 12/24/2019, 11:33 AM

## 2019-12-24 NOTE — Consult Note (Signed)
Subjective:   CC: perineal abscess and hemorrhoid  HPI:  Jill Reyes is a 44 y.o. female who was consulted by Denton Lank for evaluation of above.  First noted a few days ago.  Symptoms include: Pain is sharp, TTP.  Exacerbated by touch.  Alleviated by nothing specific.  Associated with some drainage from area.  Similar pain reported with hemorrhoids.  She had chronic issues, with occasional bleeding, worse with hard stools.  She feels hemorrhoid discomfort also worse over past several days.     Past Medical History: none reported  Past Surgical History:  has a past surgical history that includes Cesarean section; Tubal ligation; and Carpal tunnel release.  Family History: reviewed and not relevant to CC  Social History:  reports that she has been smoking cigarettes. She has never used smokeless tobacco. She reports current alcohol use. She reports previous drug use.  Current Medications:  No medications prior to admission.    Allergies:  No Known Allergies  ROS:  General: Denies weight loss, weight gain, fatigue, fevers, chills, and night sweats. Eyes: Denies blurry vision, double vision, eye pain, itchy eyes, and tearing. Ears: Denies hearing loss, earache, and ringing in ears. Nose: Denies sinus pain, congestion, infections, runny nose, and nosebleeds. Mouth/throat: Denies hoarseness, sore throat, bleeding gums, and difficulty swallowing. Heart: Denies chest pain, palpitations, racing heart, irregular heartbeat, leg pain or swelling, and decreased activity tolerance. Respiratory: Denies breathing difficulty, shortness of breath, wheezing, cough, and sputum. GI: Denies change in appetite, heartburn, nausea, vomiting, constipation, diarrhea, and blood in stool. GU: Denies difficulty urinating, pain with urinating, urgency, frequency, blood in urine. Musculoskeletal: Denies joint stiffness, pain, swelling, muscle weakness. Skin: Denies rash, itching, mass, tumors, sores, and  boils Neurologic: Denies headache, fainting, dizziness, seizures, numbness, and tingling. Psychiatric: Denies depression, anxiety, difficulty sleeping, and memory loss. Endocrine: Denies heat or cold intolerance, and increased thirst or urination. Blood/lymph: Denies easy bruising, easy bruising, and swollen glands     Objective:     BP (!) 143/104 (BP Location: Left Arm)   Pulse 97   Temp 98.3 F (36.8 C) (Oral)   Resp 18   Ht 5\' 8"  (1.727 m)   Wt 55.3 kg   LMP 12/09/2019 (Approximate) Comment: tubal ligation  SpO2 100%   BMI 18.54 kg/m   Constitutional :  alert, cooperative and appears stated age  Lymphatics/Throat:  no asymmetry, masses, or scars  Respiratory:  clear to auscultation bilaterally  Cardiovascular:  regular rate and rhythm  Gastrointestinal: soft, non-tender; bowel sounds normal; no masses,  no organomegaly.  Musculoskeletal: Steady gait and movement  Skin: Cool and moist  Psychiatric: Normal affect, non-agitated, not confused  Genita/rectal: Chaperone present for exam.  Right perineal area lateral to labia with slight dark discoloration and pinpoint opening with no active drainage, erythema, or induration but extreme TTP.  Swollen, possibly thrombosed TTP hemorrhoid noted in RP aspect.    LABS:  CMP Latest Ref Rng & Units 12/24/2019 12/23/2019 12/23/2019  Glucose 70 - 99 mg/dL 90 - 98  BUN 6 - 20 mg/dL 12/25/2019) - <6(S)  Creatinine 0.44 - 1.00 mg/dL <3(M) - <1.96(Q)  Sodium 135 - 145 mmol/L 131(L) 133(L) 129(L)  Potassium 3.5 - 5.1 mmol/L 5.4(H) 4.1 2.9(L)  Chloride 98 - 111 mmol/L 101 - 90(L)  CO2 22 - 32 mmol/L 23 - 29  Calcium 8.9 - 10.3 mg/dL 7.9(L) - 7.7(L)  Total Protein 6.5 - 8.1 g/dL 2.29(N) - 5.3(L)  Total Bilirubin 0.3 - 1.2  mg/dL 1.1 - 1.5(H)  Alkaline Phos 38 - 126 U/L 110 - 104  AST 15 - 41 U/L 93(H) - 71(H)  ALT 0 - 44 U/L 36 - 32   CBC Latest Ref Rng & Units 12/24/2019 12/23/2019 12/22/2019  WBC 4.0 - 10.5 K/uL 8.9 8.6 12.7(H)  Hemoglobin 12.0  - 15.0 g/dL 11.9(L) 11.2(L) 12.4  Hematocrit 36.0 - 46.0 % 35.6(L) 31.8(L) 35.5(L)  Platelets 150 - 400 K/uL 153 146(L) 160    RADS: CLINICAL DATA:  Perianal and perirectal pain, concern for rectal abscess  EXAM: CT PELVIS WITH CONTRAST  TECHNIQUE: Multidetector CT imaging of the pelvis was performed using the standard protocol following the bolus administration of intravenous contrast.  CONTRAST:  100mL OMNIPAQUE IOHEXOL 300 MG/ML  SOLN  COMPARISON:  02/04/2019  FINDINGS: Urinary Tract: Punctate foci of gas within the bladder lumen may reflect recent catheterization. Visualized portions of the kidneys and ureters are unremarkable.  Bowel: No bowel obstruction or ileus. There is chronic nonspecific wall thickening of the ascending colon. This could reflect chronic colitis or scarring from previous bouts of inflammation. Colonoscopy recommended if not recently performed.  There is a small focus of enhancement in the right anterolateral perianal soft tissues measuring approximately 1.7 x 0.9 cm, reference image 47. This is just distal to the anal verge. This is compatible with localized phlegmon, without fluid collection or drainable abscess.  Vascular/Lymphatic: Minimal atherosclerosis. No pathologic adenopathy.  Reproductive:  No mass or other significant abnormality  Other:  No free fluid or free gas.  Musculoskeletal: There are no acute or destructive bony lesions. 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  IMPRESSION: 1. Small localized area of enhancement in the right perianal soft tissues consistent with localized phlegmon. No fluid collection or drainable abscess. 2. Chronic wall thickening of the ascending colon and cecum. I suspect chronic inflammation or scarring. Colonoscopy recommended if not previously performed. 3. Punctate foci of gas within the bladder lumen,  suspect recent catheterization.   Electronically Signed   By: Michael  Brown M.D.   On: 12/22/2019 17:57  Assessment:     perineal pain, thrombosed internal stage IV hemorhoid   Plan:     1.not sure if there is a drainable fluid collection, but due to extensive pain, worth undergoing formal EUA, possible hemorrhoidectomy, possible I&D or perineal area in the OR.  Alternatives include continued observation with abx.  Benefits include possible symptom relief, pathologic evaluation,  Discussed the risk of surgery including recurrence, chronic pain, post-op infxn, poor cosmesis, poor/delayed wound healing, and possible re-operation to address said risks. The risks of general anesthetic, if used, includes MI, CVA, sudden death or even reaction to anesthetic medications also discussed.  Typical post-op recovery time of 3-5 days with possible activity restrictions were also discussed.  The patient verbalized understanding and all questions were answered to the patient's satisfaction.  She wises to proceed with surgical management.      

## 2019-12-25 ENCOUNTER — Inpatient Hospital Stay: Payer: Self-pay | Admitting: Anesthesiology

## 2019-12-25 ENCOUNTER — Encounter
Admission: EM | Disposition: A | Discharge status: Home / Self Care | Payer: Self-pay | Source: Home / Self Care | Attending: Internal Medicine

## 2019-12-25 HISTORY — PX: HEMORRHOID SURGERY: SHX153

## 2019-12-25 HISTORY — PX: INCISION AND DRAINAGE PERIRECTAL ABSCESS: SHX1804

## 2019-12-25 LAB — CBC
HCT: 36.3 % (ref 36.0–46.0)
Hemoglobin: 12.1 g/dL (ref 12.0–15.0)
MCH: 37.2 pg — ABNORMAL HIGH (ref 26.0–34.0)
MCHC: 33.3 g/dL (ref 30.0–36.0)
MCV: 111.7 fL — ABNORMAL HIGH (ref 80.0–100.0)
Platelets: 163 10*3/uL (ref 150–400)
RBC: 3.25 MIL/uL — ABNORMAL LOW (ref 3.87–5.11)
RDW: 15.9 % — ABNORMAL HIGH (ref 11.5–15.5)
WBC: 8.3 10*3/uL (ref 4.0–10.5)
nRBC: 0 % (ref 0.0–0.2)

## 2019-12-25 LAB — COMPREHENSIVE METABOLIC PANEL
ALT: 45 U/L — ABNORMAL HIGH (ref 0–44)
AST: 111 U/L — ABNORMAL HIGH (ref 15–41)
Albumin: 2.9 g/dL — ABNORMAL LOW (ref 3.5–5.0)
Alkaline Phosphatase: 114 U/L (ref 38–126)
Anion gap: 7 (ref 5–15)
BUN: <5 mg/dL — ABNORMAL LOW (ref 6–20)
CO2: 22 mmol/L (ref 22–32)
Calcium: 8.4 mg/dL — ABNORMAL LOW (ref 8.9–10.3)
Chloride: 101 mmol/L (ref 98–111)
Creatinine, Ser: 0.32 mg/dL — ABNORMAL LOW (ref 0.44–1.00)
GFR calc Af Amer: >60 mL/min (ref >60)
GFR calc non Af Amer: >60 mL/min (ref >60)
Glucose, Bld: 95 mg/dL (ref 70–99)
Potassium: 4.8 mmol/L (ref 3.5–5.1)
Sodium: 130 mmol/L — ABNORMAL LOW (ref 135–145)
Total Bilirubin: 1.1 mg/dL (ref 0.3–1.2)
Total Protein: 6 g/dL — ABNORMAL LOW (ref 6.5–8.1)

## 2019-12-25 LAB — URINE CULTURE: Culture: >=100000 — AB

## 2019-12-25 LAB — HCV INTERPRETATION

## 2019-12-25 LAB — PREGNANCY, URINE: Preg Test, Ur: NEGATIVE

## 2019-12-25 LAB — HCV AB W REFLEX TO QUANT PCR: HCV Ab: <0.1 s/co ratio (ref 0.0–0.9)

## 2019-12-25 LAB — MAGNESIUM: Magnesium: 2.2 mg/dL (ref 1.7–2.4)

## 2019-12-25 SURGERY — INCISION AND DRAINAGE, ABSCESS, PERIRECTAL
Anesthesia: General

## 2019-12-25 MED ORDER — BUPIVACAINE-EPINEPHRINE (PF) 0.5% -1:200000 IJ SOLN
INTRAMUSCULAR | Status: DC | PRN
  Administered 2019-12-25: 9 mL via PERINEURAL

## 2019-12-25 MED ORDER — DEXAMETHASONE SODIUM PHOSPHATE 10 MG/ML IJ SOLN
INTRAMUSCULAR | Status: DC | PRN
  Administered 2019-12-25: 5 mg via INTRAVENOUS

## 2019-12-25 MED ORDER — ADULT MULTIVITAMIN W/MINERALS CH: 1.0000 | ORAL_TABLET | Freq: Every day | ORAL | Status: DC

## 2019-12-25 MED ORDER — FOLIC ACID 1 MG PO TABS: 1.0000 mg | ORAL_TABLET | Freq: Every day | ORAL | 1 refills | Status: DC

## 2019-12-25 MED ORDER — OXYCODONE-ACETAMINOPHEN 10-325 MG PO TABS: 1.0000 | ORAL_TABLET | Freq: Four times a day (QID) | ORAL | 0 refills | Status: AC | PRN

## 2019-12-25 MED ORDER — MIDAZOLAM HCL 2 MG/2ML IJ SOLN
INTRAMUSCULAR | Status: DC | PRN
  Administered 2019-12-25: 2 mg via INTRAVENOUS

## 2019-12-25 MED ORDER — ONDANSETRON HCL 4 MG/2ML IJ SOLN: 4.0000 mg | Freq: Once | INTRAMUSCULAR | Status: DC | PRN

## 2019-12-25 MED ORDER — FENTANYL CITRATE (PF) 100 MCG/2ML IJ SOLN
25.0000 ug | INTRAMUSCULAR | Status: DC | PRN
  Administered 2019-12-25 (×2): 50 ug via INTRAVENOUS

## 2019-12-25 MED ORDER — LIDOCAINE HCL (CARDIAC) PF 100 MG/5ML IV SOSY
PREFILLED_SYRINGE | INTRAVENOUS | Status: DC | PRN
  Administered 2019-12-25: 80 mg via INTRAVENOUS

## 2019-12-25 MED ORDER — LORATADINE 10 MG PO TABS: 10.0000 mg | ORAL_TABLET | Freq: Every day | ORAL | 1 refills | Status: AC

## 2019-12-25 MED ORDER — OXYCODONE HCL 5 MG/5ML PO SOLN: 5.0000 mg | Freq: Once | ORAL | Status: DC | PRN

## 2019-12-25 MED ORDER — SULFAMETHOXAZOLE-TRIMETHOPRIM 800-160 MG PO TABS
1.0000 | ORAL_TABLET | Freq: Two times a day (BID) | ORAL | 0 refills | Status: AC
Start: 2019-12-25 — End: 2019-12-29

## 2019-12-25 MED ORDER — FENTANYL CITRATE (PF) 100 MCG/2ML IJ SOLN
INTRAMUSCULAR | Status: DC | PRN
  Administered 2019-12-25 (×3): 25 ug via INTRAVENOUS

## 2019-12-25 MED ORDER — OXYCODONE HCL 5 MG PO TABS: 5.0000 mg | ORAL_TABLET | Freq: Once | ORAL | Status: DC | PRN

## 2019-12-25 MED ORDER — ENSURE ENLIVE PO LIQD: 237.0000 mL | Freq: Two times a day (BID) | ORAL | Status: DC

## 2019-12-25 MED ORDER — ONDANSETRON HCL 4 MG/2ML IJ SOLN
INTRAMUSCULAR | Status: DC | PRN
  Administered 2019-12-25: 4 mg via INTRAVENOUS

## 2019-12-25 MED ORDER — PROPOFOL 10 MG/ML IV BOLUS
INTRAVENOUS | Status: DC | PRN
  Administered 2019-12-25: 150 mg via INTRAVENOUS

## 2019-12-25 MED ORDER — FENTANYL CITRATE (PF) 100 MCG/2ML IJ SOLN
INTRAMUSCULAR | Status: AC
  Filled 2019-12-25: qty 2

## 2019-12-25 MED ORDER — ONDANSETRON HCL 4 MG/2ML IJ SOLN
INTRAMUSCULAR | Status: AC
  Filled 2019-12-25: qty 2

## 2019-12-25 MED ORDER — MIDAZOLAM HCL 2 MG/2ML IJ SOLN
INTRAMUSCULAR | Status: AC
  Filled 2019-12-25: qty 2

## 2019-12-25 MED ORDER — PHENYLEPHRINE HCL (PRESSORS) 10 MG/ML IV SOLN
INTRAVENOUS | Status: AC
  Filled 2019-12-25: qty 1

## 2019-12-25 MED ORDER — CLONIDINE HCL 0.1 MG PO TABS: 0.1000 mg | ORAL_TABLET | Freq: Two times a day (BID) | ORAL | Status: DC | PRN

## 2019-12-25 MED ORDER — DEXMEDETOMIDINE HCL IN NACL 400 MCG/100ML IV SOLN
INTRAVENOUS | Status: DC | PRN
  Administered 2019-12-25: 8 ug via INTRAVENOUS

## 2019-12-25 MED ORDER — BUPIVACAINE LIPOSOME 1.3 % IJ SUSP
INTRAMUSCULAR | Status: DC | PRN
  Administered 2019-12-25: 20 mL

## 2019-12-25 MED ORDER — ACETAMINOPHEN 10 MG/ML IV SOLN: 1000.0000 mg | Freq: Once | INTRAVENOUS | Status: DC | PRN

## 2019-12-25 MED ORDER — GELATIN ABSORBABLE 12-7 MM EX MISC
CUTANEOUS | Status: DC | PRN
  Administered 2019-12-25: 1

## 2019-12-25 MED ORDER — ENOXAPARIN SODIUM 40 MG/0.4ML ~~LOC~~ SOLN: 40.0000 mg | SUBCUTANEOUS | Status: DC

## 2019-12-25 MED ORDER — ENSURE ENLIVE PO LIQD: 237.0000 mL | Freq: Two times a day (BID) | ORAL | 1 refills | Status: DC

## 2019-12-25 MED ORDER — DEXAMETHASONE SODIUM PHOSPHATE 10 MG/ML IJ SOLN
INTRAMUSCULAR | Status: AC
  Filled 2019-12-25: qty 1

## 2019-12-25 MED ORDER — PHENYLEPHRINE HCL (PRESSORS) 10 MG/ML IV SOLN
INTRAVENOUS | Status: DC | PRN
  Administered 2019-12-25 (×2): 100 ug via INTRAVENOUS

## 2019-12-25 MED ORDER — DEXMEDETOMIDINE HCL IN NACL 80 MCG/20ML IV SOLN
INTRAVENOUS | Status: AC
  Filled 2019-12-25: qty 20

## 2019-12-25 MED ORDER — AMOXICILLIN-POT CLAVULANATE 875-125 MG PO TABS: 1.0000 | ORAL_TABLET | Freq: Two times a day (BID) | ORAL | 0 refills | Status: AC

## 2019-12-25 SURGICAL SUPPLY — 47 items
BLADE SURG 15 STRL LF DISP TIS (BLADE) ×1 IMPLANT
BLADE SURG 15 STRL SS (BLADE) ×2
BNDG GAUZE 4.5X4.1 6PLY STRL (MISCELLANEOUS) IMPLANT
BRIEF STRETCH MATERNITY 2XLG (MISCELLANEOUS) ×3 IMPLANT
CANISTER SUCT 1200ML W/VALVE (MISCELLANEOUS) ×3 IMPLANT
COVER WAND RF STERILE (DRAPES) ×3 IMPLANT
DRAIN PENROSE 1/4X12 LTX STRL (WOUND CARE) IMPLANT
DRAIN PENROSE 5/8X18 LTX STRL (WOUND CARE) IMPLANT
DRAPE LAPAROTOMY 77X122 PED (DRAPES) ×3 IMPLANT
DRAPE PERI LITHO V/GYN (MISCELLANEOUS) ×3 IMPLANT
DRAPE UNDER BUTTOCK W/FLU (DRAPES) ×3 IMPLANT
DRSG GAUZE FLUFF 36X18 (GAUZE/BANDAGES/DRESSINGS) ×1 IMPLANT
ELECT REM PT RETURN 9FT ADLT (ELECTROSURGICAL) ×3
ELECTRODE REM PT RTRN 9FT ADLT (ELECTROSURGICAL) ×1 IMPLANT
GAUZE PACKING IODOFORM 1/2 (PACKING) ×2 IMPLANT
GAUZE PACKING IODOFORM 1X5 (MISCELLANEOUS) ×1 IMPLANT
GAUZE SPONGE 4X4 12PLY STRL (GAUZE/BANDAGES/DRESSINGS) ×3 IMPLANT
GLOVE BIOGEL PI IND STRL 7.0 (GLOVE) ×1 IMPLANT
GLOVE BIOGEL PI INDICATOR 7.0 (GLOVE) ×2
GLOVE INDICATOR 7.0 STRL GRN (GLOVE) ×3 IMPLANT
GLOVE SURG SYN 6.5 ES PF (GLOVE) ×3 IMPLANT
GLOVE SURG SYN 6.5 PF PI (GLOVE) ×1 IMPLANT
GOWN STRL REUS W/ TWL LRG LVL3 (GOWN DISPOSABLE) ×2 IMPLANT
GOWN STRL REUS W/TWL LRG LVL3 (GOWN DISPOSABLE) ×4
KIT TURNOVER CYSTO (KITS) ×3 IMPLANT
LABEL OR SOLS (LABEL) ×3 IMPLANT
NDL HYPO 25X1 1.5 SAFETY (NEEDLE) ×1 IMPLANT
NDL SAFETY ECLIPSE 18X1.5 (NEEDLE) ×1 IMPLANT
NEEDLE HYPO 18GX1.5 SHARP (NEEDLE) ×2
NEEDLE HYPO 22GX1.5 SAFETY (NEEDLE) ×3 IMPLANT
NEEDLE HYPO 25X1 1.5 SAFETY (NEEDLE) ×3 IMPLANT
NS IRRIG 500ML POUR BTL (IV SOLUTION) ×3 IMPLANT
PACK BASIN MINOR (MISCELLANEOUS) ×3 IMPLANT
PAD ABD DERMACEA PRESS 5X9 (GAUZE/BANDAGES/DRESSINGS) ×4 IMPLANT
PAD PREP 24X41 OB/GYN DISP (PERSONAL CARE ITEMS) ×3 IMPLANT
SHEARS HARMONIC 9CM CVD (BLADE) ×2 IMPLANT
SOL PREP PVP 2OZ (MISCELLANEOUS) ×3
SOLUTION PREP PVP 2OZ (MISCELLANEOUS) ×1 IMPLANT
SPONGE LAP 18X18 RF (DISPOSABLE) IMPLANT
SURGILUBE 2OZ TUBE FLIPTOP (MISCELLANEOUS) ×3 IMPLANT
SUT CHROMIC 3 0 SH 27 (SUTURE) IMPLANT
SUT VIC AB 3-0 SH 27 (SUTURE) ×2
SUT VIC AB 3-0 SH 27X BRD (SUTURE) ×1 IMPLANT
SYR 10ML LL (SYRINGE) ×6 IMPLANT
SYR 20ML LL LF (SYRINGE) ×3 IMPLANT
SYR BULB IRRIG 60ML STRL (SYRINGE) ×3 IMPLANT
TOWEL OR 17X26 4PK STRL BLUE (TOWEL DISPOSABLE) ×3 IMPLANT

## 2019-12-25 NOTE — Interval H&P Note (Signed)
History and Physical Interval Note:  12/25/2019 7:50 AM  Jill Reyes  has presented today for surgery, with the diagnosis of perirectal.  The various methods of treatment have been discussed with the patient and family. After consideration of risks, benefits and other options for treatment, the patient has consented to  Procedure(s): IRRIGATION AND DEBRIDEMENT PERIRECTAL ABSCESS (N/A) HEMORRHOIDECTOMY (N/A) as a surgical intervention.  The patient's history has been reviewed, patient examined, no change in status, stable for surgery.  I have reviewed the patient's chart and labs.  Questions were answered to the patient's satisfaction.     Dezi Brauner Tonna Boehringer

## 2019-12-25 NOTE — Anesthesia Procedure Notes (Signed)
Procedure Name: LMA Insertion Date/Time: 12/25/2019 8:10 AM Performed by: Elmarie Mainland, CRNA Pre-anesthesia Checklist: Patient identified, Emergency Drugs available, Suction available and Patient being monitored Patient Re-evaluated:Patient Re-evaluated prior to induction Oxygen Delivery Method: Circle system utilized Preoxygenation: Pre-oxygenation with 100% oxygen Induction Type: IV induction LMA: LMA inserted LMA Size: 3.5 Number of attempts: 1 Placement Confirmation: positive ETCO2 Tube secured with: Tape Dental Injury: Teeth and Oropharynx as per pre-operative assessment

## 2019-12-25 NOTE — Plan of Care (Signed)
The patient has been discharged. IV has been removed. No falls. Education performed.  Problem: Education: Goal: Knowledge of General Education information will improve Description: Including pain rating scale, medication(s)/side effects and non-pharmacologic comfort measures Outcome: Completed/Met   Problem: Health Behavior/Discharge Planning: Goal: Ability to manage health-related needs will improve Outcome: Completed/Met   Problem: Clinical Measurements: Goal: Ability to maintain clinical measurements within normal limits will improve Outcome: Completed/Met Goal: Will remain free from infection Outcome: Completed/Met Goal: Diagnostic test results will improve Outcome: Completed/Met Goal: Respiratory complications will improve Outcome: Completed/Met Goal: Cardiovascular complication will be avoided Outcome: Completed/Met   Problem: Activity: Goal: Risk for activity intolerance will decrease Outcome: Completed/Met   Problem: Nutrition: Goal: Adequate nutrition will be maintained Outcome: Completed/Met   Problem: Coping: Goal: Level of anxiety will decrease Outcome: Completed/Met   Problem: Elimination: Goal: Will not experience complications related to bowel motility Outcome: Completed/Met Goal: Will not experience complications related to urinary retention Outcome: Completed/Met   Problem: Pain Managment: Goal: General experience of comfort will improve Outcome: Completed/Met   Problem: Safety: Goal: Ability to remain free from injury will improve Outcome: Completed/Met   Problem: Skin Integrity: Goal: Risk for impaired skin integrity will decrease Outcome: Completed/Met

## 2019-12-25 NOTE — Anesthesia Preprocedure Evaluation (Addendum)
Anesthesia Evaluation  Patient identified by MRN, date of birth, ID band Patient awake    Reviewed: Allergy & Precautions, NPO status , Patient's Chart, lab work & pertinent test results  History of Anesthesia Complications Negative for: history of anesthetic complications  Airway Mallampati: I  TM Distance: >3 FB Neck ROM: Full    Dental no notable dental hx. (+) Teeth Intact, Dental Advisory Given   Pulmonary neg pulmonary ROS, neg sleep apnea, neg COPD, Current Smoker and Patient abstained from smoking.,    Pulmonary exam normal breath sounds clear to auscultation       Cardiovascular Exercise Tolerance: Good METS(-) hypertension(-) CAD and (-) Past MI negative cardio ROS  (-) dysrhythmias  Rhythm:Regular Rate:Tachycardia - Systolic murmurs    Neuro/Psych negative neurological ROS  negative psych ROS   GI/Hepatic neg GERD  ,(+)     substance abuse  alcohol use,   Endo/Other  neg diabetes  Renal/GU negative Renal ROS     Musculoskeletal   Abdominal   Peds  Hematology   Anesthesia Other Findings History reviewed. No pertinent past medical history. - does not have routine medical care  Reproductive/Obstetrics                            Anesthesia Physical Anesthesia Plan  ASA: II  Anesthesia Plan: General   Post-op Pain Management:    Induction: Intravenous  PONV Risk Score and Plan: 3 and Ondansetron, Dexamethasone and Midazolam  Airway Management Planned: Oral ETT and LMA  Additional Equipment: None  Intra-op Plan:   Post-operative Plan: Extubation in OR  Informed Consent: I have reviewed the patients History and Physical, chart, labs and discussed the procedure including the risks, benefits and alternatives for the proposed anesthesia with the patient or authorized representative who has indicated his/her understanding and acceptance.     Dental advisory  given  Plan Discussed with: CRNA and Surgeon  Anesthesia Plan Comments: (Patient with sinus tachycardia per notes, to 140s sometimes; medicine team believe associated with sepsis from abscess. S/p fluid resuscitation, improved HR. Patient denies known cardiac hx.  Discussed risks of anesthesia with patient, including PONV, sore throat, lip/dental damage. Rare risks discussed as well, such as cardiorespiratory and neurological sequelae. Patient understands.)       Anesthesia Quick Evaluation

## 2019-12-25 NOTE — Progress Notes (Signed)
NP Jon Billings made aware of patient's HR running from 130's to 150's while patient active. HR returned to normal after patient settled in bed. No new orders received.

## 2019-12-25 NOTE — Anesthesia Postprocedure Evaluation (Signed)
Anesthesia Post Note  Patient: Jill Reyes  Procedure(s) Performed: IRRIGATION AND DEBRIDEMENT PERIRECTAL ABSCESS (N/A ) HEMORRHOIDECTOMY (N/A )  Patient location during evaluation: PACU Anesthesia Type: General Level of consciousness: awake and alert and oriented Pain management: pain level controlled Vital Signs Assessment: post-procedure vital signs reviewed and stable Respiratory status: spontaneous breathing Cardiovascular status: blood pressure returned to baseline Anesthetic complications: no     Last Vitals:  Vitals:   12/25/19 1053 12/25/19 1201  BP: 118/86 (!) 139/95  Pulse: 94 98  Resp: 15 18  Temp: 36.7 C 37.1 C  SpO2: 100% 99%    Last Pain:  Vitals:   12/25/19 1201  TempSrc: Oral  PainSc:                  Arisa Congleton

## 2019-12-25 NOTE — Transfer of Care (Signed)
Immediate Anesthesia Transfer of Care Note  Patient: Jill Reyes  Procedure(s) Performed: IRRIGATION AND DEBRIDEMENT PERIRECTAL ABSCESS (N/A ) HEMORRHOIDECTOMY (N/A )  Patient Location: PACU  Anesthesia Type:General  Level of Consciousness: drowsy  Airway & Oxygen Therapy: Patient Spontanous Breathing and Patient connected to face mask oxygen  Post-op Assessment: Report given to RN and Post -op Vital signs reviewed and stable  Post vital signs: Reviewed and stable  Last Vitals:  Vitals Value Taken Time  BP 134/99 12/25/19 0859  Temp 36 C 12/25/19 0856  Pulse 74 12/25/19 0901  Resp 9 12/25/19 0901  SpO2 100 % 12/25/19 0901  Vitals shown include unvalidated device data.  Last Pain:  Vitals:   12/25/19 0856  TempSrc:   PainSc: 0-No pain         Complications: No apparent anesthesia complications

## 2019-12-25 NOTE — Discharge Summary (Signed)
Physician Discharge Summary  Jill Reyes UEA:[id number] DOB: 1976/05/23 DOA: 12/22/2019  PCP: Patient, No Pcp Per  Admit date: 12/22/2019 Discharge date: 12/25/2019  Admitted From: home Disposition:  home  Recommendations for Outpatient Follow-up:  1. Follow up with PCP in 1-2 weeks 2. Please obtain BMP/CBC in one week   Home Health: no  Equipment/Devices: none   Discharge Condition: stable  CODE STATUS: full  Diet recommendation: Regular   Discharge Diagnoses: Principal Problem:   Perianal abscess Active Problems:   Hypokalemia   Hyponatremia   Colon wall thickening   Macrocytosis   Elevated AST (SGOT)   Folate deficiency    Summary of HPI and Hospital Course:  Jill Reyes is a 44 y.o. female with medical history significant for alcohol abuse and tobacco abuse presented to the ED on 12/22/19 with worsening perianal pain that started two months prior and progressively worsened over two weeks until quite severe.  Associated nausea/vomiting due to pain, poor appetite, localized swelling and drainage of dark bloody/brown fluid.  No fever/chills.  Does not follow with healthcare providers.  In the ED, hypertensive 148/108 and tachycardic HR 140 initially (improved to 116), other vitals stable.  Labs notable for hyponatremia 128, hypokalemia 2.4, AST 82, Tbili 1.9, WBC's 11.7k, macrocytosis.  UA negative for infection.  CT pelvis showed a small localized area of enhancement in the right perianal soft tissues consistent with localized phlegmon. No fluid collection or drainable abscess, chronic wall thickening of the ascending colon and cecum (suspected to be chronic inflammation or scarring, colonoscopy recommended if not previously performed).  She was treated with broad spectrum antiobiotcs, K replaced.  Admitted to hospitalist service for perianal cellulitis with possible abscess.    Sepsis secondary Perianal cellulitis and abscess- present on admission, sepsis as evidenced by  leukocytosis and tachycardia in setting of cellulitis/abscess.  Normal lactate.  Fluids and broad spectrum antibiotics started in ED.  Vitals now stable.  Leukocytosis resolved.  Treated with Rocephin, discharged with Augmentin to complete course. --general surgery consulted  --pain medication as needed --cleanse area with warm soap and water at least twice daily  Hemorrhoids - with bleeding and significant pain.  Underwent hemorrhoidectomy on 4/26 with general surgery.  Continue stool softeners and pain control.  Anusol cream.  Hypokalemia - present on admission K, 2.4.  Replaced & resolved.    Hypotonic Hyponatremia - present on admission Na 128 (normal a year ago).  Improved with LR.  Normal TSH rules out hypothyroidism.  Cortisol also normal.  Hypotonic serum, hypertonic urine and high urine sodium.  Differential includes SIADH, stress, drugs.  Less likely beer potomania as urine sodium high.  Recheck BMP in 1 week.  Asymptomatic Bacteruria - no UTI symptoms so no need to treat, but already on antibiotics for above.  Follow urine culture.  Monitor for symptoms.  Colon wall thickening - seen on CT pelvis on admission. Outpatient follow-up with GI for colonoscopy recommended if not done previously.   Folate Deficiency with Macrocytosis - not anemic.  Likely due to EtOH and poor nutrition.   --folic acid 1 mg PO daily --monitor CBC --counsel regarding nutrition  Elevated AST - 82 on admission, suspect due to EtOH.  Monitor.  Tobacco abuse - nicotine patch.  Counseled patient on importance of smoking cessation.   Discharge Instructions   Discharge Instructions    Call MD for:  severe uncontrolled pain   Complete by: As directed    Call MD for:  temperature >100.4  Complete by: As directed    Diet - low sodium heart healthy   Complete by: As directed    Discharge instructions   Complete by: As directed    Remove packing from surgery site in 2 days. Use pain medications  only AS NEEDED for moderate to severe pain.   Increase activity slowly   Complete by: As directed      Allergies as of 12/25/2019   No Known Allergies     Medication List    TAKE these medications   amoxicillin-clavulanate 875-125 MG tablet Commonly known as: AUGMENTIN Take 1 tablet by mouth every 12 (twelve) hours for 4 days.   feeding supplement (ENSURE ENLIVE) Liqd Take 237 mLs by mouth 2 (two) times daily between meals.   folic acid 1 MG tablet Commonly known as: FOLVITE Take 1 tablet (1 mg total) by mouth daily. Start taking on: December 26, 2019   loratadine 10 MG tablet Commonly known as: CLARITIN Take 1 tablet (10 mg total) by mouth daily. Start taking on: December 26, 2019   multivitamin with minerals Tabs tablet Take 1 tablet by mouth daily. Start taking on: December 26, 2019   oxyCODONE-acetaminophen 10-325 MG tablet Commonly known as: Percocet Take 1 tablet by mouth every 6 (six) hours as needed for up to 5 days for pain.   sulfamethoxazole-trimethoprim 800-160 MG tablet Commonly known as: BACTRIM DS Take 1 tablet by mouth 2 (two) times daily for 4 days.       No Known Allergies  Consultations:  General surgery    Procedures/Studies: CT PELVIS W CONTRAST  Result Date: 12/22/2019 CLINICAL DATA:  Perianal and perirectal pain, concern for rectal abscess EXAM: CT PELVIS WITH CONTRAST TECHNIQUE: Multidetector CT imaging of the pelvis was performed using the standard protocol following the bolus administration of intravenous contrast. CONTRAST:  142mL OMNIPAQUE IOHEXOL 300 MG/ML  SOLN COMPARISON:  02/04/2019 FINDINGS: Urinary Tract: Punctate foci of gas within the bladder lumen may reflect recent catheterization. Visualized portions of the kidneys and ureters are unremarkable. Bowel: No bowel obstruction or ileus. There is chronic nonspecific wall thickening of the ascending colon. This could reflect chronic colitis or scarring from previous bouts of inflammation.  Colonoscopy recommended if not recently performed. There is a small focus of enhancement in the right anterolateral perianal soft tissues measuring approximately 1.7 x 0.9 cm, reference image 47. This is just distal to the anal verge. This is compatible with localized phlegmon, without fluid collection or drainable abscess. Vascular/Lymphatic: Minimal atherosclerosis. No pathologic adenopathy. Reproductive:  No mass or other significant abnormality Other:  No free fluid or free gas. Musculoskeletal: There are no acute or destructive bony lesions. 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 IMPRESSION: 1. Small localized area of enhancement in the right perianal soft tissues consistent with localized phlegmon. No fluid collection or drainable abscess. 2. Chronic wall thickening of the ascending colon and cecum. I suspect chronic inflammation or scarring. Colonoscopy recommended if not previously performed. 3. Punctate foci of gas within the bladder lumen, suspect recent catheterization. Electronically Signed   By: Randa Ngo M.D.   On: 12/22/2019 17:57       Subjective: Patient seen at bedside after surgery, states feeling much better, much less pain.  No acute events reported.  She asks about going home.  Denies fevers or chills.   Discharge Exam: Vitals:   12/25/19 1053 12/25/19 1201  BP: 118/86 Marland Kitchen)  139/95  Pulse: 94 98  Resp: 15 18  Temp: 98 F (36.7 C) 98.8 F (37.1 C)  SpO2: 100% 99%   Vitals:   12/25/19 0956 12/25/19 1016 12/25/19 1053 12/25/19 1201  BP: (!) 127/94 (!) 121/93 118/86 (!) 139/95  Pulse: 89 93 94 98  Resp: (!) 7 15 15 18   Temp: 97.6 F (36.4 C) 97.7 F (36.5 C) 98 F (36.7 C) 98.8 F (37.1 C)  TempSrc:  Oral Oral Oral  SpO2: 100% 100% 100% 99%  Weight:      Height:        General: Pt is alert, awake, not in acute distress Cardiovascular: RRR, S1/S2 +, no rubs, no  gallops Respiratory: CTA bilaterally, no wheezing, no rhonchi Abdominal: Soft, NT, ND, bowel sounds + Extremities: no edema, no cyanosis    The results of significant diagnostics from this hospitalization (including imaging, microbiology, ancillary and laboratory) are listed below for reference.     Microbiology: Recent Results (from the past 240 hour(s))  Blood culture (routine x 2)     Status: None (Preliminary result)   Collection Time: 12/22/19  4:29 PM   Specimen: BLOOD  Result Value Ref Range Status   Specimen Description BLOOD LEFT FA  Final   Special Requests   Final    BOTTLES DRAWN AEROBIC AND ANAEROBIC Blood Culture results may not be optimal due to an excessive volume of blood received in culture bottles   Culture   Final    NO GROWTH 3 DAYS Performed at [facility], [address] [place], [place] [address]    Report Status PENDING  Incomplete  Urine Culture     Status: Abnormal   Collection Time: 12/22/19  6:06 PM   Specimen: Urine, Random  Result Value Ref Range Status   Specimen Description   Final    URINE, RANDOM Performed at [facility], [address] [place], [place] [address]    Special Requests   Final    NONE Performed at [facility], [address] [place], [place] [address]    Culture (A)  Final    >=100,000 COLONIES/mL ESCHERICHIA COLI Confirmed Extended Spectrum Beta-Lactamase Producer (ESBL).  In bloodstream infections from ESBL organisms, carbapenems are preferred over piperacillin/tazobactam. They are shown to have a lower risk of mortality.    Report Status 12/25/2019 FINAL  Final   Organism ID, Bacteria ESCHERICHIA COLI (A)  Final      Susceptibility   Escherichia coli - MIC*    AMPICILLIN >=32 RESISTANT Resistant     CEFAZOLIN >=64 RESISTANT Resistant     CEFTRIAXONE RESISTANT Resistant     CIPROFLOXACIN <=0.25 SENSITIVE Sensitive     GENTAMICIN <=1 SENSITIVE Sensitive     IMIPENEM <=0.25 SENSITIVE  Sensitive     NITROFURANTOIN <=16 SENSITIVE Sensitive     TRIMETH/SULFA <=20 SENSITIVE Sensitive     AMPICILLIN/SULBACTAM 16 INTERMEDIATE Intermediate     PIP/TAZO 8 SENSITIVE Sensitive     * >=100,000 COLONIES/mL ESCHERICHIA COLI  Blood culture (routine x 2)     Status: None (Preliminary result)   Collection Time: 12/22/19  7:37 PM   Specimen: BLOOD  Result Value Ref Range Status   Specimen Description BLOOD RIGHT ASSIST CONTROL  Final   Special Requests   Final    BOTTLES DRAWN AEROBIC AND ANAEROBIC Blood Culture results may not be optimal due to an inadequate volume of blood received in culture bottles   Culture   Final  NO GROWTH 3 DAYS Performed at [facility], [address] [place], [place] [address]    Report Status PENDING  Incomplete  SARS CORONAVIRUS 2 (TAT 6-24 HRS) Nasopharyngeal Nasopharyngeal Swab     Status: None   Collection Time: 12/22/19  7:37 PM   Specimen: Nasopharyngeal Swab  Result Value Ref Range Status   SARS Coronavirus 2 NEGATIVE NEGATIVE Final    Comment: (NOTE) SARS-CoV-2 target nucleic acids are NOT DETECTED. The SARS-CoV-2 RNA is generally detectable in upper and lower respiratory specimens during the acute phase of infection. Negative results do not preclude SARS-CoV-2 infection, do not rule out co-infections with other pathogens, and should not be used as the sole basis for treatment or other patient management decisions. Negative results must be combined with clinical observations, patient history, and epidemiological information. The expected result is Negative. Fact Sheet for Patients: HairSlick.no Fact Sheet for Healthcare Providers: quierodirigir.com This test is not yet approved or cleared by the [place] FDA and  has been authorized for detection and/or diagnosis of SARS-CoV-2 by FDA under an Emergency Use Authorization (EUA). This EUA will remain  in effect (meaning  this test can be used) for the duration of the COVID-19 declaration under Section 56 4(b)(1) of the Act, 21 U.S.C. section 360bbb-3(b)(1), unless the authorization is terminated or revoked sooner. Performed at [facility], [address], [place], [place] [address]      Labs: BNP (last 3 results) No results for input(s): BNP in the last 8760 hours. Basic Metabolic Panel: Recent Labs  Lab 12/22/19 1629 12/23/19 0422 12/23/19 1320 12/24/19 0624 12/25/19 0642  NA 128* 129* 133* 131* 130*  K 2.4* 2.9* 4.1 5.4* 4.8  CL 80* 90*  --  101 101  CO2 31 29  --  23 22  GLUCOSE 100* 98  --  90 95  BUN 8 <5*  --  <5* <5*  CREATININE 0.44 0.42*  --  <0.30* 0.32*  CALCIUM 9.0 7.7*  --  7.9* 8.4*  MG 2.1 2.8*  --   --  2.2   Liver Function Tests: Recent Labs  Lab 12/22/19 1629 12/23/19 0422 12/24/19 0624 12/25/19 0642  AST 82* 71* 93* 111*  ALT 41 32 36 45*  ALKPHOS 125 104 110 114  BILITOT 1.9* 1.5* 1.1 1.1  PROT 6.8 5.3* 5.6* 6.0*  ALBUMIN 3.5 2.6* 2.9* 2.9*   No results for input(s): LIPASE, AMYLASE in the last 168 hours. No results for input(s): AMMONIA in the last 168 hours. CBC: Recent Labs  Lab 12/22/19 1629 12/23/19 0422 12/24/19 0624 12/25/19 0642  WBC 11.7* 8.6 8.9 8.3  NEUTROABS 8.5*  --  6.4  --   HGB 13.8 11.2* 11.9* 12.1  HCT 37.9 31.8* 35.6* 36.3  MCV 100.8* 107.1* 110.9* 111.7*  PLT 194 146* 153 163   Cardiac Enzymes: No results for input(s): CKTOTAL, CKMB, CKMBINDEX, TROPONINI in the last 168 hours. BNP: Invalid input(s): POCBNP CBG: No results for input(s): GLUCAP in the last 168 hours. D-Dimer No results for input(s): DDIMER in the last 72 hours. Hgb A1c Recent Labs    12/23/19 0422  HGBA1C 4.6*   Lipid Profile No results for input(s): CHOL, HDL, LDLCALC, TRIG, CHOLHDL, LDLDIRECT in the last 72 hours. Thyroid function studies Recent Labs    12/23/19 1043  TSH 1.864   Anemia work up Recent Labs    12/23/19 0422  VITAMINB12  839  FOLATE 1.8*   Urinalysis    Component  Value Date/Time   COLORURINE AMBER (A) 12/22/2019 1806   APPEARANCEUR HAZY (A) 12/22/2019 1806   LABSPEC >1.046 (H) 12/22/2019 1806   PHURINE 6.0 12/22/2019 1806   GLUCOSEU NEGATIVE 12/22/2019 1806   HGBUR LARGE (A) 12/22/2019 1806   BILIRUBINUR NEGATIVE 12/22/2019 1806   KETONESUR NEGATIVE 12/22/2019 1806   PROTEINUR NEGATIVE 12/22/2019 1806   NITRITE POSITIVE (A) 12/22/2019 1806   LEUKOCYTESUR NEGATIVE 12/22/2019 1806   Sepsis Labs Invalid input(s): PROCALCITONIN,  WBC,  LACTICIDVEN Microbiology Recent Results (from the past 240 hour(s))  Blood culture (routine x 2)     Status: None (Preliminary result)   Collection Time: 12/22/19  4:29 PM   Specimen: BLOOD  Result Value Ref Range Status   Specimen Description BLOOD LEFT FA  Final   Special Requests   Final    BOTTLES DRAWN AEROBIC AND ANAEROBIC Blood Culture results may not be optimal due to an excessive volume of blood received in culture bottles   Culture   Final    NO GROWTH 3 DAYS Performed at [facility], [address] [place], [place] [address]    Report Status PENDING  Incomplete  Urine Culture     Status: Abnormal   Collection Time: 12/22/19  6:06 PM   Specimen: Urine, Random  Result Value Ref Range Status   Specimen Description   Final    URINE, RANDOM Performed at [facility], [address] [place], [place] [address]    Special Requests   Final    NONE Performed at [facility], [address] [place], [place] [address]    Culture (A)  Final    >=100,000 COLONIES/mL ESCHERICHIA COLI Confirmed Extended Spectrum Beta-Lactamase Producer (ESBL).  In bloodstream infections from ESBL organisms, carbapenems are preferred over piperacillin/tazobactam. They are shown to have a lower risk of mortality.    Report Status 12/25/2019 FINAL  Final   Organism ID, Bacteria ESCHERICHIA COLI (A)  Final      Susceptibility   Escherichia coli  - MIC*    AMPICILLIN >=32 RESISTANT Resistant     CEFAZOLIN >=64 RESISTANT Resistant     CEFTRIAXONE RESISTANT Resistant     CIPROFLOXACIN <=0.25 SENSITIVE Sensitive     GENTAMICIN <=1 SENSITIVE Sensitive     IMIPENEM <=0.25 SENSITIVE Sensitive     NITROFURANTOIN <=16 SENSITIVE Sensitive     TRIMETH/SULFA <=20 SENSITIVE Sensitive     AMPICILLIN/SULBACTAM 16 INTERMEDIATE Intermediate     PIP/TAZO 8 SENSITIVE Sensitive     * >=100,000 COLONIES/mL ESCHERICHIA COLI  Blood culture (routine x 2)     Status: None (Preliminary result)   Collection Time: 12/22/19  7:37 PM   Specimen: BLOOD  Result Value Ref Range Status   Specimen Description BLOOD RIGHT ASSIST CONTROL  Final   Special Requests   Final    BOTTLES DRAWN AEROBIC AND ANAEROBIC Blood Culture results may not be optimal due to an inadequate volume of blood received in culture bottles   Culture   Final    NO GROWTH 3 DAYS Performed at [facility], [address] [place], [place] [address]    Report Status PENDING  Incomplete  SARS CORONAVIRUS 2 (TAT 6-24 HRS) Nasopharyngeal Nasopharyngeal Swab     Status: None   Collection Time: 12/22/19  7:37 PM   Specimen: Nasopharyngeal Swab  Result Value Ref Range Status   SARS Coronavirus 2 NEGATIVE NEGATIVE Final    Comment: (NOTE) SARS-CoV-2 target nucleic acids are NOT DETECTED. The  SARS-CoV-2 RNA is generally detectable in upper and lower respiratory specimens during the acute phase of infection. Negative results do not preclude SARS-CoV-2 infection, do not rule out co-infections with other pathogens, and should not be used as the sole basis for treatment or other patient management decisions. Negative results must be combined with clinical observations, patient history, and epidemiological information. The expected result is Negative. Fact Sheet for Patients: HairSlick.no Fact Sheet for Healthcare  Providers: quierodirigir.com This test is not yet approved or cleared by the [place] FDA and  has been authorized for detection and/or diagnosis of SARS-CoV-2 by FDA under an Emergency Use Authorization (EUA). This EUA will remain  in effect (meaning this test can be used) for the duration of the COVID-19 declaration under Section 56 4(b)(1) of the Act, 21 U.S.C. section 360bbb-3(b)(1), unless the authorization is terminated or revoked sooner. Performed at [facility], [address] [place], [place] [address]      Time coordinating discharge: Over 30 minutes  SIGNED:   Pennie Banter, DO [facility] 12/25/2019, 2:45 PM   If 7PM-7AM, please contact night-coverage [email or web address]

## 2019-12-26 LAB — SURGICAL PATHOLOGY

## 2019-12-26 NOTE — Op Note (Signed)
Preoperative diagnosis: Fourth degree internal hemorrhoids.  Perianal abscess  Postoperative diagnosis: Same Procedure: exam under anesthesia, fourth degree internal hemorrhoidectomy.  An incision and drainage of perianal abscess  Surgeon: Tonna Boehringer  Anesthesia: general  Specimen: hemorrhoids x1  Complications: none  EBL: 50mL  Wound classification: Clean Contaminated  Indications: Patient is a 44 y.o. female was found to have symptomatic internal hemorrhoids refractory to medical management.  As well as perianal abscess  Findings: 1.  Fourth degree internal hemorrhoids with adjacent perianal abscess 2. Internal and external anal sphincter palpated and preserved 3. Adequate hemostasis  Description of procedure: The patient was brought to the operating room and general anesthesia was induced. Patient was placed high lithotomy position. A time-out was completed verifying correct patient, procedure, site, positioning, and implant(s) and/or special equipment prior to beginning this procedure. The perineum was prepped and draped in standard sterile fashion. Local anesthetic was injected as a perianal block. An anoscope was introduced and hemorrhoidal pedicle identified.  A harmonic was placed across the base of the right anteriorpedicle and excess hemorrhoidal tissue removed.  Specimen was passed off operative field pending pathology.  3-0 Vicryl then used to close the open wound.  Hemostasis achieved with additional 3-0 vicryl suture in areas of bleeding until all active bleeding controlled.  Last inspection of the anal canal did not note any additional hemorrhoidal tissue and no other pathology. Exparel injected as a perianal block.   Palpable perianal abscess in the 10 o'clock position with scant purulent discharge.  Incision was made to widen the opening and wound irrigated until no purulent drainage noted.  Inspection of the wound noted superficial abscess down to the subcutaneous layer  with no tracking elsewhere.  Wound in the end measured approximately 7 mm x 5 mm x 3 mm deep  A gauze pad was tucked between the gluteal folds, and secured in place with mesh underwear.  The patient tolerated the procedure well and was taken to the postanesthesia care unit in stable condition.  Sponge and instrument count correct at end of procedure.

## 2019-12-27 LAB — CULTURE, BLOOD (ROUTINE X 2)
Culture: NO GROWTH
Culture: NO GROWTH

## 2020-01-24 ENCOUNTER — Inpatient Hospital Stay
Admission: EM | Admit: 2020-01-24 | Discharge: 2020-01-26 | DRG: 309 | Disposition: A | Discharge status: Home / Self Care | Payer: Medicaid Other | Attending: Internal Medicine | Admitting: Internal Medicine

## 2020-01-24 ENCOUNTER — Emergency Department: Payer: Medicaid Other

## 2020-01-24 ENCOUNTER — Encounter: Payer: Self-pay | Admitting: Emergency Medicine

## 2020-01-24 ENCOUNTER — Other Ambulatory Visit: Payer: Self-pay

## 2020-01-24 DIAGNOSIS — M7989 Other specified soft tissue disorders: Secondary | ICD-10-CM

## 2020-01-24 DIAGNOSIS — E440 Moderate protein-calorie malnutrition: Secondary | ICD-10-CM | POA: Diagnosis present

## 2020-01-24 DIAGNOSIS — Z20822 Contact with and (suspected) exposure to covid-19: Secondary | ICD-10-CM | POA: Diagnosis present

## 2020-01-24 DIAGNOSIS — N390 Urinary tract infection, site not specified: Secondary | ICD-10-CM

## 2020-01-24 DIAGNOSIS — F17210 Nicotine dependence, cigarettes, uncomplicated: Secondary | ICD-10-CM | POA: Diagnosis present

## 2020-01-24 DIAGNOSIS — Z681 Body mass index (BMI) 19 or less, adult: Secondary | ICD-10-CM

## 2020-01-24 DIAGNOSIS — I739 Peripheral vascular disease, unspecified: Secondary | ICD-10-CM | POA: Diagnosis present

## 2020-01-24 DIAGNOSIS — E876 Hypokalemia: Secondary | ICD-10-CM | POA: Diagnosis present

## 2020-01-24 DIAGNOSIS — R42 Dizziness and giddiness: Secondary | ICD-10-CM | POA: Diagnosis present

## 2020-01-24 DIAGNOSIS — R911 Solitary pulmonary nodule: Secondary | ICD-10-CM | POA: Diagnosis present

## 2020-01-24 DIAGNOSIS — F1290 Cannabis use, unspecified, uncomplicated: Secondary | ICD-10-CM | POA: Diagnosis present

## 2020-01-24 DIAGNOSIS — R112 Nausea with vomiting, unspecified: Secondary | ICD-10-CM | POA: Diagnosis present

## 2020-01-24 DIAGNOSIS — R000 Tachycardia, unspecified: Secondary | ICD-10-CM | POA: Diagnosis present

## 2020-01-24 DIAGNOSIS — E43 Unspecified severe protein-calorie malnutrition: Secondary | ICD-10-CM | POA: Diagnosis present

## 2020-01-24 DIAGNOSIS — A419 Sepsis, unspecified organism: Secondary | ICD-10-CM

## 2020-01-24 DIAGNOSIS — IMO0001 Reserved for inherently not codable concepts without codable children: Secondary | ICD-10-CM | POA: Diagnosis present

## 2020-01-24 DIAGNOSIS — Z79899 Other long term (current) drug therapy: Secondary | ICD-10-CM | POA: Diagnosis not present

## 2020-01-24 DIAGNOSIS — R6510 Systemic inflammatory response syndrome (SIRS) of non-infectious origin without acute organ dysfunction: Secondary | ICD-10-CM | POA: Diagnosis present

## 2020-01-24 DIAGNOSIS — F17200 Nicotine dependence, unspecified, uncomplicated: Secondary | ICD-10-CM | POA: Diagnosis present

## 2020-01-24 LAB — COMPREHENSIVE METABOLIC PANEL
ALT: 28 U/L (ref 0–44)
AST: 142 U/L — ABNORMAL HIGH (ref 15–41)
Albumin: 3.7 g/dL (ref 3.5–5.0)
Alkaline Phosphatase: 142 U/L — ABNORMAL HIGH (ref 38–126)
Anion gap: 19 — ABNORMAL HIGH (ref 5–15)
BUN: 9 mg/dL (ref 6–20)
CO2: 22 mmol/L (ref 22–32)
Calcium: 9.3 mg/dL (ref 8.9–10.3)
Chloride: 94 mmol/L — ABNORMAL LOW (ref 98–111)
Creatinine, Ser: 0.48 mg/dL (ref 0.44–1.00)
GFR calc Af Amer: >60 mL/min (ref >60)
GFR calc non Af Amer: >60 mL/min (ref >60)
Glucose, Bld: 136 mg/dL — ABNORMAL HIGH (ref 70–99)
Potassium: 3.3 mmol/L — ABNORMAL LOW (ref 3.5–5.1)
Sodium: 135 mmol/L (ref 135–145)
Total Bilirubin: 2 mg/dL — ABNORMAL HIGH (ref 0.3–1.2)
Total Protein: 7.6 g/dL (ref 6.5–8.1)

## 2020-01-24 LAB — CBC WITH DIFFERENTIAL/PLATELET
Abs Immature Granulocytes: 0.05 10*3/uL (ref 0.00–0.07)
Basophils Absolute: 0.1 10*3/uL (ref 0.0–0.1)
Basophils Relative: 1 %
Eosinophils Absolute: 0 10*3/uL (ref 0.0–0.5)
Eosinophils Relative: 0 %
HCT: 45.2 % (ref 36.0–46.0)
Hemoglobin: 15.9 g/dL — ABNORMAL HIGH (ref 12.0–15.0)
Immature Granulocytes: 0 %
Lymphocytes Relative: 12 %
Lymphs Abs: 1.4 10*3/uL (ref 0.7–4.0)
MCH: 35.1 pg — ABNORMAL HIGH (ref 26.0–34.0)
MCHC: 35.2 g/dL (ref 30.0–36.0)
MCV: 99.8 fL (ref 80.0–100.0)
Monocytes Absolute: 0.6 10*3/uL (ref 0.1–1.0)
Monocytes Relative: 5 %
Neutro Abs: 10.1 10*3/uL — ABNORMAL HIGH (ref 1.7–7.7)
Neutrophils Relative %: 82 %
Platelets: 244 10*3/uL (ref 150–400)
RBC: 4.53 MIL/uL (ref 3.87–5.11)
RDW: 15 % (ref 11.5–15.5)
WBC: 12.2 10*3/uL — ABNORMAL HIGH (ref 4.0–10.5)
nRBC: 0 % (ref 0.0–0.2)

## 2020-01-24 LAB — T4, FREE: Free T4: 0.96 ng/dL (ref 0.61–1.12)

## 2020-01-24 LAB — TROPONIN I (HIGH SENSITIVITY)
Troponin I (High Sensitivity): 7 ng/L (ref <18)
Troponin I (High Sensitivity): 10 ng/L (ref <18)

## 2020-01-24 LAB — HCG, QUANTITATIVE, PREGNANCY: hCG, Beta Chain, Quant, S: <1 m[IU]/mL (ref <5)

## 2020-01-24 LAB — MAGNESIUM: Magnesium: 1.5 mg/dL — ABNORMAL LOW (ref 1.7–2.4)

## 2020-01-24 LAB — SARS CORONAVIRUS 2 BY RT PCR (HOSPITAL ORDER, PERFORMED IN ~~LOC~~ HOSPITAL LAB): SARS Coronavirus 2: NEGATIVE

## 2020-01-24 LAB — LACTIC ACID, PLASMA: Lactic Acid, Venous: 0.8 mmol/L (ref 0.5–1.9)

## 2020-01-24 LAB — FIBRIN DERIVATIVES D-DIMER (ARMC ONLY): Fibrin derivatives D-dimer (ARMC): 782.31 ng/mL (FEU) — ABNORMAL HIGH (ref 0.00–499.00)

## 2020-01-24 LAB — TSH: TSH: 1.679 u[IU]/mL (ref 0.350–4.500)

## 2020-01-24 MED ORDER — ADULT MULTIVITAMIN W/MINERALS CH
1.0000 | ORAL_TABLET | Freq: Every day | ORAL | Status: DC
  Administered 2020-01-25 – 2020-01-26 (×2): 1 via ORAL
  Filled 2020-01-24 (×3): qty 1

## 2020-01-24 MED ORDER — ACETAMINOPHEN 325 MG PO TABS: 650.0000 mg | ORAL_TABLET | Freq: Four times a day (QID) | ORAL | Status: DC | PRN

## 2020-01-24 MED ORDER — OXYCODONE HCL 5 MG PO TABS
5.0000 mg | ORAL_TABLET | Freq: Four times a day (QID) | ORAL | Status: DC | PRN
  Administered 2020-01-25 – 2020-01-26 (×5): 5 mg via ORAL
  Filled 2020-01-24 (×6): qty 1

## 2020-01-24 MED ORDER — LORATADINE 10 MG PO TABS
10.0000 mg | ORAL_TABLET | Freq: Every day | ORAL | Status: DC
  Administered 2020-01-25 – 2020-01-26 (×2): 10 mg via ORAL
  Filled 2020-01-24 (×3): qty 1

## 2020-01-24 MED ORDER — ACETAMINOPHEN 650 MG RE SUPP: 650.0000 mg | Freq: Four times a day (QID) | RECTAL | Status: DC | PRN

## 2020-01-24 MED ORDER — SODIUM CHLORIDE 0.9 % IV BOLUS
1000.0000 mL | Freq: Once | INTRAVENOUS | Status: AC
  Administered 2020-01-24: 1000 mL via INTRAVENOUS

## 2020-01-24 MED ORDER — ONDANSETRON HCL 4 MG/2ML IJ SOLN
4.0000 mg | Freq: Once | INTRAMUSCULAR | Status: AC
  Administered 2020-01-24: 4 mg via INTRAVENOUS

## 2020-01-24 MED ORDER — IOHEXOL 350 MG/ML SOLN
75.0000 mL | Freq: Once | INTRAVENOUS | Status: AC | PRN
  Administered 2020-01-24: 75 mL via INTRAVENOUS

## 2020-01-24 MED ORDER — ENOXAPARIN SODIUM 40 MG/0.4ML ~~LOC~~ SOLN
40.0000 mg | SUBCUTANEOUS | Status: DC
  Administered 2020-01-24 – 2020-01-25 (×2): 40 mg via SUBCUTANEOUS
  Filled 2020-01-24 (×2): qty 0.4

## 2020-01-24 MED ORDER — METOPROLOL TARTRATE 25 MG PO TABS
25.0000 mg | ORAL_TABLET | Freq: Two times a day (BID) | ORAL | Status: DC
  Administered 2020-01-24 – 2020-01-26 (×4): 25 mg via ORAL
  Filled 2020-01-24 (×4): qty 1

## 2020-01-24 MED ORDER — POTASSIUM CHLORIDE 10 MEQ/100ML IV SOLN
10.0000 meq | Freq: Once | INTRAVENOUS | Status: AC
  Administered 2020-01-24: 10 meq via INTRAVENOUS
  Filled 2020-01-24: qty 100

## 2020-01-24 MED ORDER — DILTIAZEM HCL 25 MG/5ML IV SOLN
10.0000 mg | Freq: Once | INTRAVENOUS | Status: AC
  Administered 2020-01-24: 10 mg via INTRAVENOUS
  Filled 2020-01-24: qty 5

## 2020-01-24 MED ORDER — ONDANSETRON HCL 4 MG/2ML IJ SOLN
INTRAMUSCULAR | Status: AC
  Filled 2020-01-24: qty 2

## 2020-01-24 MED ORDER — SODIUM CHLORIDE 0.9 % IV SOLN
1.0000 g | Freq: Three times a day (TID) | INTRAVENOUS | Status: DC
  Administered 2020-01-24 – 2020-01-26 (×5): 1 g via INTRAVENOUS
  Filled 2020-01-24 (×7): qty 1

## 2020-01-24 MED ORDER — SODIUM CHLORIDE 0.9 % IV SOLN: INTRAVENOUS | Status: DC

## 2020-01-24 MED ORDER — NICOTINE 21 MG/24HR TD PT24
21.0000 mg | MEDICATED_PATCH | Freq: Every day | TRANSDERMAL | Status: DC
  Administered 2020-01-24 – 2020-01-26 (×3): 21 mg via TRANSDERMAL
  Filled 2020-01-24 (×3): qty 1

## 2020-01-24 MED ORDER — ONDANSETRON HCL 4 MG/2ML IJ SOLN
4.0000 mg | Freq: Four times a day (QID) | INTRAMUSCULAR | Status: DC | PRN
  Administered 2020-01-24: 4 mg via INTRAVENOUS
  Filled 2020-01-24: qty 2

## 2020-01-24 MED ORDER — POTASSIUM CHLORIDE CRYS ER 20 MEQ PO TBCR
40.0000 meq | EXTENDED_RELEASE_TABLET | Freq: Once | ORAL | Status: AC
  Administered 2020-01-24: 40 meq via ORAL
  Filled 2020-01-24: qty 2

## 2020-01-24 MED ORDER — ONDANSETRON HCL 4 MG PO TABS: 4.0000 mg | ORAL_TABLET | Freq: Four times a day (QID) | ORAL | Status: DC | PRN

## 2020-01-24 MED ORDER — SODIUM CHLORIDE 0.9 % IV SOLN
2.0000 g | Freq: Once | INTRAVENOUS | Status: AC
  Administered 2020-01-24: 2 g via INTRAVENOUS
  Filled 2020-01-24: qty 2

## 2020-01-24 MED ORDER — OXYCODONE-ACETAMINOPHEN 5-325 MG PO TABS
1.0000 | ORAL_TABLET | Freq: Four times a day (QID) | ORAL | Status: DC | PRN
  Administered 2020-01-24 – 2020-01-25 (×2): 1 via ORAL
  Filled 2020-01-24 (×2): qty 1

## 2020-01-24 MED ORDER — SODIUM CHLORIDE 0.9 % IV SOLN
1.0000 g | Freq: Three times a day (TID) | INTRAVENOUS | Status: DC
  Filled 2020-01-24 (×2): qty 1

## 2020-01-24 MED ORDER — DIAZEPAM 5 MG PO TABS: 5.0000 mg | ORAL_TABLET | Freq: Two times a day (BID) | ORAL | Status: DC | PRN

## 2020-01-24 MED ORDER — OXYCODONE-ACETAMINOPHEN 10-325 MG PO TABS: 1.0000 | ORAL_TABLET | Freq: Four times a day (QID) | ORAL | Status: DC | PRN

## 2020-01-24 MED ORDER — MAGNESIUM SULFATE 2 GM/50ML IV SOLN
2.0000 g | Freq: Once | INTRAVENOUS | Status: AC
  Administered 2020-01-24: 2 g via INTRAVENOUS
  Filled 2020-01-24: qty 50

## 2020-01-24 MED ORDER — PROMETHAZINE HCL 25 MG/ML IJ SOLN
25.0000 mg | Freq: Once | INTRAMUSCULAR | Status: AC
  Administered 2020-01-24: 25 mg via INTRAVENOUS
  Filled 2020-01-24: qty 1

## 2020-01-24 NOTE — ED Triage Notes (Addendum)
Patient presents to the ED from her PCP with heart rate in the 170s.  Patient is complaining of weakness and dizziness since this am.

## 2020-01-24 NOTE — H&P (Addendum)
History and Physical    Jill Reyes GNF:[id number] DOB: 06/09/1976 DOA: 01/24/2020  PCP: Patient, No Pcp Per   Patient coming from: Home  I have personally briefly reviewed patient's old medical records in [facility]  Chief Complaint: Weakness                                Dizziness  HPI: Jill Reyes is a 44 y.o. female with no significant medical history who presents to the emergency room for evaluation of dizziness.  Patient is status post perineal abscess/hemorrhoid surgery which was done about a month ago.  Per patient she has been immobile due to the surgery.  She presents to the ER for evaluation of dizziness which is constant with no relieving or aggravating factors.  She went for her follow-up appointment with the surgeon and was advised to come to the ER for evaluation of elevated heart rate.  She states that her oral intake has been poor but she denies having any fever chills.  She has had nausea but no vomiting.  She denies having any chest pain or shortness of breath.  She denies having any urinary frequency, dysuria, nocturia or abdominal pain. Labs reveal hypokalemia and hypomagnesemia.  She also has significant pyuria and a white count of 12,000. She had an elevated D-dimer and CT scan of the chest without contrast was negative for PE but showed a 3 mm nodule in the right lower lobe  ED Course: Patient presents to the emergency room for evaluation of dizziness and tachycardia with pulse rate in the 140s.  The ER provider obtained a CT angiogram to rule out PE which was negative.  Patient received a dose of diltiazem without any significant improvement in her heart rate.  She received IV fluid hydration in the emergency room.  She has hypokalemia and hypomagnesemia.  Review of Systems: As per HPI otherwise 10 point review of systems negative.    History reviewed. No pertinent past medical history.  Past Surgical History:  Procedure Laterality Date   CARPAL TUNNEL  RELEASE     CESAREAN SECTION     HEMORRHOID SURGERY N/A 12/25/2019   Procedure: HEMORRHOIDECTOMY;  Surgeon: Sung Amabile, DO;  Location: [facility] ORS;  Service: General;  Laterality: N/A;   INCISION AND DRAINAGE PERIRECTAL ABSCESS N/A 12/25/2019   Procedure: IRRIGATION AND DEBRIDEMENT PERIRECTAL ABSCESS;  Surgeon: Sung Amabile, DO;  Location: [facility] ORS;  Service: General;  Laterality: N/A;   TUBAL LIGATION       reports that she has been smoking cigarettes. She has never used smokeless tobacco. She reports current alcohol use. She reports previous drug use.  No Known Allergies  No family history on file.   Prior to Admission medications   Medication Sig Start Date End Date Taking? Authorizing Provider  diazepam (VALIUM) 5 MG tablet SMARTSIG:1 Tablet(s) By Mouth Every 12 Hours PRN 01/02/20  Yes [provider]  loratadine (CLARITIN) 10 MG tablet Take 1 tablet (10 mg total) by mouth daily. 12/26/19  Yes Pennie Banter, DO  Multiple Vitamin (MULTIVITAMIN WITH MINERALS) TABS tablet Take 1 tablet by mouth daily. 12/26/19  Yes Pennie Banter, DO  oxyCODONE-acetaminophen (PERCOCET) 10-325 MG tablet Take 1 tablet by mouth every 6 (six) hours as needed for pain.     [provider]    Physical Exam: Vitals:   01/24/20 1210 01/24/20 1230 01/24/20 1240 01/24/20 1330  BP: (!) 151/118 Marland Kitchen)  145/100 (!) 146/96 113/73  Pulse: (!) 139 (!) 143 (!) 138 (!) 142  Resp: (!) 24 19 (!) 24 20  SpO2: 97% 98% 97% 96%     Vitals:   01/24/20 1210 01/24/20 1230 01/24/20 1240 01/24/20 1330  BP: (!) 151/118 (!) 145/100 (!) 146/96 113/73  Pulse: (!) 139 (!) 143 (!) 138 (!) 142  Resp: (!) 24 19 (!) 24 20  SpO2: 97% 98% 97% 96%    Constitutional: NAD, alert and oriented x 3 Eyes: PERRL, lids and conjunctivae normal ENMT: Mucous membranes are dry Neck: normal, supple, no masses, no thyromegaly Respiratory: clear to auscultation bilaterally, no wheezing, no crackles. Normal respiratory  effort. No accessory muscle use.  Cardiovascular: Regular rate and rhythm, no murmurs / rubs / gallops. No extremity edema. 2+ pedal pulses. No carotid bruits.  Abdomen: no tenderness, no masses palpated. No hepatosplenomegaly. Bowel sounds positive.  Musculoskeletal: no clubbing / cyanosis. No joint deformity upper and lower extremities.  Skin: no rashes, lesions, ulcers.  Neurologic: No gross focal neurologic deficit. Psychiatric: Normal mood and affect.   Labs on Admission: I have personally reviewed following labs and imaging studies  CBC: Recent Labs  Lab 01/24/20 1106  WBC 12.2*  NEUTROABS 10.1*  HGB 15.9*  HCT 45.2  MCV 99.8  PLT 010   Basic Metabolic Panel: Recent Labs  Lab 01/24/20 1106  NA 135  K 3.3*  CL 94*  CO2 22  GLUCOSE 136*  BUN 9  CREATININE 0.48  CALCIUM 9.3  MG 1.5*   GFR: CrCl cannot be calculated (Unknown ideal weight.). Liver Function Tests: Recent Labs  Lab 01/24/20 1106  AST 142*  ALT 28  ALKPHOS 142*  BILITOT 2.0*  PROT 7.6  ALBUMIN 3.7   No results for input(s): LIPASE, AMYLASE in the last 168 hours. No results for input(s): AMMONIA in the last 168 hours. Coagulation Profile: No results for input(s): INR, PROTIME in the last 168 hours. Cardiac Enzymes: No results for input(s): CKTOTAL, CKMB, CKMBINDEX, TROPONINI in the last 168 hours. BNP (last 3 results) No results for input(s): PROBNP in the last 8760 hours. HbA1C: No results for input(s): HGBA1C in the last 72 hours. CBG: No results for input(s): GLUCAP in the last 168 hours. Lipid Profile: No results for input(s): CHOL, HDL, LDLCALC, TRIG, CHOLHDL, LDLDIRECT in the last 72 hours. Thyroid Function Tests: Recent Labs    01/24/20 1106  TSH 1.679  FREET4 0.96   Anemia Panel: No results for input(s): VITAMINB12, FOLATE, FERRITIN, TIBC, IRON, RETICCTPCT in the last 72 hours. Urine analysis:    Component Value Date/Time   COLORURINE AMBER (A) 12/22/2019 1806    APPEARANCEUR HAZY (A) 12/22/2019 1806   LABSPEC >1.046 (H) 12/22/2019 1806   PHURINE 6.0 12/22/2019 1806   GLUCOSEU NEGATIVE 12/22/2019 1806   HGBUR LARGE (A) 12/22/2019 1806   BILIRUBINUR NEGATIVE 12/22/2019 1806   KETONESUR NEGATIVE 12/22/2019 1806   PROTEINUR NEGATIVE 12/22/2019 1806   NITRITE POSITIVE (A) 12/22/2019 1806   LEUKOCYTESUR NEGATIVE 12/22/2019 1806    Radiological Exams on Admission: CT Angio Chest PE W and/or Wo Contrast  Result Date: 01/24/2020 CLINICAL DATA:  Tachycardia, weakness and dizziness EXAM: CT ANGIOGRAPHY CHEST WITH CONTRAST TECHNIQUE: Multidetector CT imaging of the chest was performed using the standard protocol during bolus administration of intravenous contrast. Multiplanar CT image reconstructions and MIPs were obtained to evaluate the vascular anatomy. CONTRAST:  17mL OMNIPAQUE IOHEXOL 350 MG/ML SOLN COMPARISON:  None. FINDINGS: Cardiovascular: No filling defects within  the pulmonary arteries to suggest acute pulmonary embolism. No acute findings of the aorta or great vessels. No pericardial fluid. Mediastinum/Nodes: No axillary or supraclavicular adenopathy. No mediastinal or hilar adenopathy. No pericardial effusion. Esophagus normal. Lungs/Pleura: No pulmonary infarction. No infiltrate or pleural fluid. No pneumothorax. 3 mm nodule in the RIGHT lower lobe (image 56/series 6). Upper Abdomen: Diffuse hepatic steatosis Musculoskeletal: No acute osseous abnormality. Review of the MIP images confirms the above findings. IMPRESSION: 1. No acute pulmonary embolism. 2. No acute pulmonary parenchymal findings. 3. Small RIGHT lower lobe pulmonary nodule. No follow-up needed if patient is low-risk. Non-contrast chest CT can be considered in 12 months if patient is high-risk. This recommendation follows the consensus statement: Guidelines for Management of Incidental Pulmonary Nodules Detected on CT Images: From the Fleischner Society 2017; Radiology 2017; 284:228-243.  Electronically Signed   By: Genevive Bi M.D.   On: 01/24/2020 13:20   DG Chest Portable 1 View  Result Date: 01/24/2020 CLINICAL DATA:  Smoker with shortness of breath, tachycardia, weakness and dizziness. EXAM: PORTABLE CHEST 1 VIEW COMPARISON:  None. FINDINGS: Normal sized heart. Clear lungs. Mild hyperexpansion of the lungs with mild peribronchial thickening. Mild scoliosis. Right humeral head bone island. IMPRESSION: Mild changes of COPD and chronic bronchitis.  No acute abnormality. Electronically Signed   By: Beckie Salts M.D.   On: 01/24/2020 11:53    EKG: Independently reviewed.  Sinus Tachycardia  Assessment/Plan Principal Problem:   Sepsis secondary to UTI [facility]) Active Problems:   Hypokalemia   Hypomagnesemia   Sepsis (HCC)   Nicotine dependence   Lung nodule < 6cm on CT    Sepsis secondary to UTI (POA) Patient presents to the emergency room for evaluation of weakness and dizziness admitted to be tachycardic with heart rate in the 140s Twelve-lead EKG showed sinus tachycardia Patient was also tachypneic Source of sepsis appears to be the urine since patient has significant pyuria Prior urine culture yielded ESBL E. Coli Aggressive IV fluid resuscitation Place patient on antibiotic therapy with meropenem Follow-up results of urine culture and blood culture please check please check   Hypokalemia Supplement potassium   Hypomagnesemia Supplement magnesium   Nicotine dependence Smoking cessation has been discussed with patient in detail We will place her on a nicotine transdermal patch 21 mg daily   Lung nodule Incidental finding on CT scan of the chest < 45mm Patient will need follow-up as an outpatient for serial CTs  DVT prophylaxis: Lovenox Code Status: Full Family Communication: Greater than 50% of time was spent discussing patient's condition and plan of care with her at the bedside.  All questions and concerns have been addressed.  She verbalizes  understanding and agrees with the plan. Disposition Plan: Back to previous home environment Consults called: None    Estevon Fluke MD [facility]     01/24/2020, 3:51 PM

## 2020-01-24 NOTE — Plan of Care (Signed)
Patient admitted with sepsis. Plan is to give IV fluids, magnesium, and potassium replacements. Urine cultures & blood cultures drawn today. Patient understands current care plan with no questions.

## 2020-01-24 NOTE — Consult Note (Signed)
Pharmacy Antibiotic Note  Jill Reyes is a 44 y.o. female admitted on 01/24/2020 with UTI. Patient has a history of ESBL UTI. Pharmacy has been consulted for Meropenem and Cefepime dosing.  Plan: 1) Meropenem 1g Q8 hours  2) Cefepime 1g Q8 hours  Weight: 57.6 kg (127 lb)  Temp (24hrs), Avg:99.1 F (37.3 C), Min:99.1 F (37.3 C), Max:99.1 F (37.3 C)  Recent Labs  Lab 01/24/20 1106  WBC 12.2*  CREATININE 0.48    Estimated Creatinine Clearance: 83.3 mL/min (by C-G formula based on SCr of 0.48 mg/dL).    No Known Allergies  Antimicrobials this admission: Meropenem 5/26 >>  Cefepime 5/26 >>   Microbiology results: 5/26 BCx: pending  Thank you for allowing pharmacy to be a part of this patient's care.  Bettey Costa 01/24/2020 4:52 PM

## 2020-01-24 NOTE — ED Provider Notes (Signed)
[facility] Emergency Department Provider Note  ____________________________________________   First MD Initiated Contact with Patient 01/24/20 1118     (approximate)  I have reviewed the triage vital signs and the nursing notes.   HISTORY  Chief Complaint Tachycardia    HPI Jill Reyes is a 44 y.o. female who comes in with dizziness.  Patient is status post perineal abscess, hemorrhoid surgery on 4/26.  Patient states that she has been more immobile due to the surgery.  States that she is felt fine yesterday but today she felt dizzy, severe, constant, nothing made it better, nothing made it worse.  She went for her follow-up and was told to come to the ER due to her elevated heart rates.  She states the area has gotten a lot better.  The area was evaluated by surgery today per patient states that it looked well.  Denies any worsening pain.   She denies any fevers.  Patient does report not eating or drinking as well recently.  Has had some nausea and vomiting.  Denies any abdominal pain.          History reviewed. No pertinent past medical history.  Patient Active Problem List   Diagnosis Date Noted  . Folate deficiency 12/24/2019  . Perianal abscess 12/22/2019  . Hypokalemia 12/22/2019  . Hyponatremia 12/22/2019  . Macrocytosis 12/22/2019  . Elevated AST (SGOT) 12/22/2019  . Colon wall thickening 12/22/2019    Past Surgical History:  Procedure Laterality Date  . CARPAL TUNNEL RELEASE    . CESAREAN SECTION    . HEMORRHOID SURGERY N/A 12/25/2019   Procedure: HEMORRHOIDECTOMY;  Surgeon: Benjamine Sprague, DO;  Location: [facility] ORS;  Service: General;  Laterality: N/A;  . INCISION AND DRAINAGE PERIRECTAL ABSCESS N/A 12/25/2019   Procedure: IRRIGATION AND DEBRIDEMENT PERIRECTAL ABSCESS;  Surgeon: Benjamine Sprague, DO;  Location: [facility] ORS;  Service: General;  Laterality: N/A;  . TUBAL LIGATION      Prior to Admission medications   Medication Sig Start Date  End Date Taking? Authorizing Provider  feeding supplement, ENSURE ENLIVE, (ENSURE ENLIVE) LIQD Take 237 mLs by mouth 2 (two) times daily between meals. 12/25/19   Ezekiel Slocumb, DO  folic acid (FOLVITE) 1 MG tablet Take 1 tablet (1 mg total) by mouth daily. 12/26/19   Ezekiel Slocumb, DO  loratadine (CLARITIN) 10 MG tablet Take 1 tablet (10 mg total) by mouth daily. 12/26/19   Ezekiel Slocumb, DO  Multiple Vitamin (MULTIVITAMIN WITH MINERALS) TABS tablet Take 1 tablet by mouth daily. 12/26/19   Ezekiel Slocumb, DO    Allergies Patient has no known allergies.  No family history on file.  Social History Social History   Tobacco Use  . Smoking status: Current Every Day Smoker    Types: Cigarettes  . Smokeless tobacco: Never Used  Substance Use Topics  . Alcohol use: Yes  . Drug use: Not Currently      Review of Systems Constitutional: No fever/chills Eyes: No visual changes. ENT: No sore throat. Cardiovascular: Denies chest pain.  Dizziness Respiratory: Denies shortness of breath. Gastrointestinal: No abdominal pain.  Positive nausea and vomiting Genitourinary: Negative for dysuria. Musculoskeletal: Negative for back pain. Skin: Negative for rash. Neurological: Negative for headaches, focal weakness or numbness. All other ROS negative ____________________________________________   PHYSICAL EXAM:  VITAL SIGNS: ED Triage Vitals  Enc Vitals Group     BP      Pulse      Resp  Temp      Temp src      SpO2      Weight      Height      Head Circumference      Peak Flow      Pain Score      Pain Loc      Pain Edu?      Excl. in GC?     Constitutional: Alert and oriented.  Eyes: Conjunctivae are normal. EOMI. Head: Atraumatic. Nose: No congestion/rhinnorhea. Mouth/Throat: Mucous membranes are moist.   Neck: No stridor. Trachea Midline. FROM Cardiovascular tachycardic, regular rhythm. Grossly normal heart sounds.  Good peripheral  circulation. Respiratory: Normal respiratory effort.  No retractions. Lungs CTAB. Gastrointestinal: Soft and nontender. No distention. No abdominal bruits.  Musculoskeletal: No lower extremity tenderness nor edema.  No joint effusions. Neurologic:  Normal speech and language. No gross focal neurologic deficits are appreciated.  Skin:  Skin is warm, dry and intact. No rash noted. Psychiatric: Mood and affect are normal. Speech and behavior are normal. GU: Area of recent surgery was evaluated.  Appears to be well-healing.  No fluctuation, no redness, no warmth.  ____________________________________________   LABS (all labs ordered are listed, but only abnormal results are displayed)  Labs Reviewed  COMPREHENSIVE METABOLIC PANEL - Abnormal; Notable for the following components:      Result Value   Potassium 3.3 (*)    Chloride 94 (*)    Glucose, Bld 136 (*)    AST 142 (*)    Alkaline Phosphatase 142 (*)    Total Bilirubin 2.0 (*)    Anion gap 19 (*)    All other components within normal limits  MAGNESIUM - Abnormal; Notable for the following components:   Magnesium 1.5 (*)    All other components within normal limits  FIBRIN DERIVATIVES D-DIMER ([facility] ONLY) - Abnormal; Notable for the following components:   Fibrin derivatives D-dimer ([facility]) 782.31 (*)    All other components within normal limits  URINALYSIS, COMPLETE (UACMP) WITH MICROSCOPIC - Abnormal; Notable for the following components:   Color, Urine AMBER (*)    APPearance HAZY (*)    Specific Gravity, Urine 1.036 (*)    Hgb urine dipstick LARGE (*)    Ketones, ur 5 (*)    RBC / HPF >50 (*)    All other components within normal limits  CBC WITH DIFFERENTIAL/PLATELET - Abnormal; Notable for the following components:   WBC 12.2 (*)    Hemoglobin 15.9 (*)    MCH 35.1 (*)    Neutro Abs 10.1 (*)    All other components within normal limits  CORTISOL-AM, BLOOD - Abnormal; Notable for the following components:   Cortisol -  AM 5.2 (*)    All other components within normal limits  CBC - Abnormal; Notable for the following components:   RBC 3.59 (*)    MCV 103.6 (*)    MCH 35.1 (*)    Platelets 143 (*)    All other components within normal limits  BASIC METABOLIC PANEL - Abnormal; Notable for the following components:   Creatinine, Ser 0.32 (*)    Calcium 8.0 (*)    All other components within normal limits  SARS CORONAVIRUS 2 BY RT PCR (HOSPITAL ORDER, PERFORMED IN [facility])  CULTURE, BLOOD (ROUTINE X 2)  CULTURE, BLOOD (ROUTINE X 2)  TSH  T4, FREE  HCG, QUANTITATIVE, PREGNANCY  PROTIME-INR  PROCALCITONIN  LACTIC ACID, PLASMA  LACTIC ACID, PLASMA  POC URINE PREG, ED  TROPONIN I (HIGH SENSITIVITY)  TROPONIN I (HIGH SENSITIVITY)   ____________________________________________   ED ECG REPORT I, Concha SeMary E Shoshanah Dapper, the attending physician, personally viewed and interpreted this ECG.  EKG sinus tachycardia rate of 165, no ST elevation, no T wave inversion, normal intervals  EKG sinus tachycardia rate of 141, no ST elevation, no T wave inversion, normal intervals ____________________________________________  RADIOLOGY Vela ProseI, Oris Staffieri E Tevion Laforge, personally viewed and evaluated these images (plain radiographs) as part of my medical decision making, as well as reviewing the written report by the radiologist.  ED MD interpretation: No pneumonia  Official radiology report(s): CT Angio Chest PE W and/or Wo Contrast  Result Date: 01/24/2020 CLINICAL DATA:  Tachycardia, weakness and dizziness EXAM: CT ANGIOGRAPHY CHEST WITH CONTRAST TECHNIQUE: Multidetector CT imaging of the chest was performed using the standard protocol during bolus administration of intravenous contrast. Multiplanar CT image reconstructions and MIPs were obtained to evaluate the vascular anatomy. CONTRAST:  75mL OMNIPAQUE IOHEXOL 350 MG/ML SOLN COMPARISON:  None. FINDINGS: Cardiovascular: No filling defects within the pulmonary arteries  to suggest acute pulmonary embolism. No acute findings of the aorta or great vessels. No pericardial fluid. Mediastinum/Nodes: No axillary or supraclavicular adenopathy. No mediastinal or hilar adenopathy. No pericardial effusion. Esophagus normal. Lungs/Pleura: No pulmonary infarction. No infiltrate or pleural fluid. No pneumothorax. 3 mm nodule in the RIGHT lower lobe (image 56/series 6). Upper Abdomen: Diffuse hepatic steatosis Musculoskeletal: No acute osseous abnormality. Review of the MIP images confirms the above findings. IMPRESSION: 1. No acute pulmonary embolism. 2. No acute pulmonary parenchymal findings. 3. Small RIGHT lower lobe pulmonary nodule. No follow-up needed if patient is low-risk. Non-contrast chest CT can be considered in 12 months if patient is high-risk. This recommendation follows the consensus statement: Guidelines for Management of Incidental Pulmonary Nodules Detected on CT Images: From the Fleischner Society 2017; Radiology 2017; 284:228-243. Electronically Signed   By: Genevive BiStewart  Edmunds M.D.   On: 01/24/2020 13:20   DG Chest Portable 1 View  Result Date: 01/24/2020 CLINICAL DATA:  Smoker with shortness of breath, tachycardia, weakness and dizziness. EXAM: PORTABLE CHEST 1 VIEW COMPARISON:  None. FINDINGS: Normal sized heart. Clear lungs. Mild hyperexpansion of the lungs with mild peribronchial thickening. Mild scoliosis. Right humeral head bone island. IMPRESSION: Mild changes of COPD and chronic bronchitis.  No acute abnormality. Electronically Signed   By: Beckie SaltsSteven  Reid M.D.   On: 01/24/2020 11:53    ____________________________________________   PROCEDURES  Procedure(s) performed (including Critical Care):  .1-3 Lead EKG Interpretation Performed by: Concha SeFunke, Elizardo Chilson E, MD Authorized by: Concha SeFunke, Morgane Joerger E, MD     Interpretation: abnormal     ECG rate:  130-160s   ECG rate assessment: tachycardic     Rhythm: sinus rhythm     Ectopy: none     Conduction: normal   .Critical  Care Performed by: Concha SeFunke, Sharissa Brierley E, MD Authorized by: Concha SeFunke, Katrena Stehlin E, MD   Critical care provider statement:    Critical care time (minutes):  35   Critical care was necessary to treat or prevent imminent or life-threatening deterioration of the following conditions:  Cardiac failure   Critical care was time spent personally by me on the following activities:  Discussions with consultants, evaluation of patient's response to treatment, examination of patient, ordering and performing treatments and interventions, ordering and review of laboratory studies, ordering and review of radiographic studies, pulse oximetry, re-evaluation of patient's condition, obtaining history from patient or surrogate and review of  old charts     ____________________________________________   INITIAL IMPRESSION / ASSESSMENT AND PLAN / ED COURSE  Aleli Navedo was evaluated in Emergency Department on 01/24/2020 for the symptoms described in the history of present illness. She was evaluated in the context of the global COVID-19 pandemic, which necessitated consideration that the patient might be at risk for infection with the SARS-CoV-2 virus that causes COVID-19. Institutional protocols and algorithms that pertain to the evaluation of patients at risk for COVID-19 are in a state of rapid change based on information released by regulatory bodies including the CDC and federal and state organizations. These policies and algorithms were followed during the patient's care in the ED.    Patient is a 44 year old who comes in with significant tachycardia in the setting of dizziness.  Will get labs to evaluate Electra abnormalities, AKI.  Given recent surgery will get D-dimer to evaluate for the possibility of pulmonary embolism.  The area of her recent surgery looks well appearing.  No evidence of abscess or cellulitis or recurrent infection.  She denies any abdominal tenderness.  At this time I do not think patient appear septic.  I  suspect that most likely she is dehydrated and will trial some fluids.  D-dimer elevated.  Will get CT scan  Patient slightly low potassium and magnesium will replete.  Also elevated anion gap suggestion some dehydration  1:37 PM heart rate coming down from 160s to 145 even after liter of fluid.  CT scan was negative for PE.  Given the continued tachycardia I suspect that this could be atrial flutter 2-1 conduction therefore will give some diltiazem to see if any improvement.  2:22 PM not much improvement with the diltiazem and her fluids seem to make it a heart rate go down more.  I suspect this is more likely sinus tachycardia given whem im the room I start talking to her it speeds up in nature is not as constant as I would expect for atrial flutter.  Given the continued tachycardia Electra abnormalities will discuss for admission   At this time Patient is has no source of sepsis.  Her urine is still pending and the hospitalist can follow-up on that.  Will discuss with the hospital team for admission for continued tachycardia.     ____________________________________________   FINAL CLINICAL IMPRESSION(S) / ED DIAGNOSES   Final diagnoses:  Tachycardia  Hypokalemia  Hypomagnesemia  Non-intractable vomiting with nausea, unspecified vomiting type      MEDICATIONS GIVEN DURING THIS VISIT:  Medications  diazepam (VALIUM) tablet 5 mg (has no administration in time range)  multivitamin with minerals tablet 1 tablet (1 tablet Oral Refused 01/24/20 1608)  loratadine (CLARITIN) tablet 10 mg (10 mg Oral Refused 01/24/20 1608)  enoxaparin (LOVENOX) injection 40 mg (40 mg Subcutaneous Given 01/24/20 1637)  0.9 %  sodium chloride infusion ( Intravenous New Bag/Given 01/24/20 1629)  acetaminophen (TYLENOL) tablet 650 mg (has no administration in time range)    Or  acetaminophen (TYLENOL) suppository 650 mg (has no administration in time range)  ondansetron (ZOFRAN) tablet 4 mg ( Oral See  Alternative 01/24/20 1834)    Or  ondansetron (ZOFRAN) injection 4 mg (4 mg Intravenous Given 01/24/20 1834)  oxyCODONE-acetaminophen (PERCOCET/ROXICET) 5-325 MG per tablet 1 tablet (1 tablet Oral Given 01/25/20 0235)    Or  oxyCODONE (Oxy IR/ROXICODONE) immediate release tablet 5 mg ( Oral See Alternative 01/25/20 0235)  nicotine (NICODERM CQ - dosed in mg/24 hours) patch 21 mg (21 mg  Transdermal Patch Applied 01/24/20 1637)  metoprolol tartrate (LOPRESSOR) tablet 25 mg (25 mg Oral Given 01/24/20 1637)  meropenem (MERREM) 1 g in sodium chloride 0.9 % 100 mL IVPB (1 g Intravenous New Bag/Given 01/25/20 0623)  sodium chloride 0.9 % bolus 1,000 mL (0 mLs Intravenous Stopped 01/24/20 1205)  ondansetron (ZOFRAN) injection 4 mg (4 mg Intravenous Given 01/24/20 1130)  sodium chloride 0.9 % bolus 1,000 mL (0 mLs Intravenous Stopped 01/24/20 1255)  promethazine (PHENERGAN) injection 25 mg (25 mg Intravenous Given 01/24/20 1332)  iohexol (OMNIPAQUE) 350 MG/ML injection 75 mL (75 mLs Intravenous Contrast Given 01/24/20 1253)  magnesium sulfate IVPB 2 g 50 mL (0 g Intravenous Stopped 01/24/20 1449)  potassium chloride 10 mEq in 100 mL IVPB (0 mEq Intravenous Stopped 01/24/20 1449)  diltiazem (CARDIZEM) injection 10 mg (10 mg Intravenous Given 01/24/20 1346)  ceFEPIme (MAXIPIME) 2 g in sodium chloride 0.9 % 100 mL IVPB (0 g Intravenous Stopped 01/24/20 1730)  potassium chloride SA (KLOR-CON) CR tablet 40 mEq (40 mEq Oral Given 01/24/20 1640)     ED Discharge Orders    None       Note:  This document was prepared using Dragon voice recognition software and may include unintentional dictation errors.   Concha Se, MD 01/25/20 [phone]

## 2020-01-24 NOTE — Plan of Care (Signed)
  Problem: Activity: Goal: Risk for activity intolerance will decrease Outcome: Progressing   Problem: Pain Managment: Goal: General experience of comfort will improve Outcome: Progressing   Problem: Safety: Goal: Ability to remain free from injury will improve Outcome: Progressing   Problem: Nutrition: Goal: Adequate nutrition will be maintained Outcome: Not Progressing

## 2020-01-24 NOTE — ED Notes (Signed)
Dr. Fuller Plan to bedside to assess pt at this time.

## 2020-01-24 NOTE — ED Notes (Signed)
Pt with noted dry heaves, Dr. Fuller Plan aware, new orders received, see [facility]

## 2020-01-25 ENCOUNTER — Inpatient Hospital Stay: Payer: Medicaid Other

## 2020-01-25 DIAGNOSIS — E43 Unspecified severe protein-calorie malnutrition: Secondary | ICD-10-CM | POA: Diagnosis present

## 2020-01-25 DIAGNOSIS — R911 Solitary pulmonary nodule: Secondary | ICD-10-CM

## 2020-01-25 DIAGNOSIS — R000 Tachycardia, unspecified: Principal | ICD-10-CM

## 2020-01-25 LAB — URINALYSIS, COMPLETE (UACMP) WITH MICROSCOPIC
Bacteria, UA: NONE SEEN
Bilirubin Urine: NEGATIVE
Glucose, UA: NEGATIVE mg/dL
Ketones, ur: 5 mg/dL — AB
Leukocytes,Ua: NEGATIVE
Nitrite: NEGATIVE
Protein, ur: NEGATIVE mg/dL
RBC / HPF: >50 RBC/hpf — ABNORMAL HIGH (ref 0–5)
Specific Gravity, Urine: 1.036 — ABNORMAL HIGH (ref 1.005–1.030)
pH: 6 (ref 5.0–8.0)

## 2020-01-25 LAB — VITAMIN B12: Vitamin B-12: 511 pg/mL (ref 180–914)

## 2020-01-25 LAB — BASIC METABOLIC PANEL
Anion gap: 8 (ref 5–15)
BUN: 7 mg/dL (ref 6–20)
CO2: 28 mmol/L (ref 22–32)
Calcium: 8 mg/dL — ABNORMAL LOW (ref 8.9–10.3)
Chloride: 100 mmol/L (ref 98–111)
Creatinine, Ser: 0.32 mg/dL — ABNORMAL LOW (ref 0.44–1.00)
GFR calc Af Amer: >60 mL/min (ref >60)
GFR calc non Af Amer: >60 mL/min (ref >60)
Glucose, Bld: 90 mg/dL (ref 70–99)
Potassium: 3.6 mmol/L (ref 3.5–5.1)
Sodium: 136 mmol/L (ref 135–145)

## 2020-01-25 LAB — PROTIME-INR
INR: 1.1 (ref 0.8–1.2)
Prothrombin Time: 14.1 seconds (ref 11.4–15.2)

## 2020-01-25 LAB — LACTIC ACID, PLASMA: Lactic Acid, Venous: 0.7 mmol/L (ref 0.5–1.9)

## 2020-01-25 LAB — CBC
HCT: 37.2 % (ref 36.0–46.0)
Hemoglobin: 12.6 g/dL (ref 12.0–15.0)
MCH: 35.1 pg — ABNORMAL HIGH (ref 26.0–34.0)
MCHC: 33.9 g/dL (ref 30.0–36.0)
MCV: 103.6 fL — ABNORMAL HIGH (ref 80.0–100.0)
Platelets: 143 10*3/uL — ABNORMAL LOW (ref 150–400)
RBC: 3.59 MIL/uL — ABNORMAL LOW (ref 3.87–5.11)
RDW: 15.4 % (ref 11.5–15.5)
WBC: 6.5 10*3/uL (ref 4.0–10.5)
nRBC: 0 % (ref 0.0–0.2)

## 2020-01-25 LAB — IRON AND TIBC
Iron: 50 ug/dL (ref 28–170)
Saturation Ratios: 21 % (ref 10.4–31.8)
TIBC: 238 ug/dL — ABNORMAL LOW (ref 250–450)
UIBC: 188 ug/dL

## 2020-01-25 LAB — CK: Total CK: 35 U/L — ABNORMAL LOW (ref 38–234)

## 2020-01-25 LAB — MAGNESIUM: Magnesium: 2.3 mg/dL (ref 1.7–2.4)

## 2020-01-25 LAB — PROCALCITONIN: Procalcitonin: <0.1 ng/mL

## 2020-01-25 LAB — CORTISOL-AM, BLOOD: Cortisol - AM: 5.2 ug/dL — ABNORMAL LOW (ref 6.7–22.6)

## 2020-01-25 MED ORDER — DOCUSATE SODIUM 100 MG PO CAPS
100.0000 mg | ORAL_CAPSULE | Freq: Every day | ORAL | Status: DC
  Administered 2020-01-25 – 2020-01-26 (×2): 100 mg via ORAL
  Filled 2020-01-25 (×2): qty 1

## 2020-01-25 MED ORDER — GABAPENTIN 100 MG PO CAPS
100.0000 mg | ORAL_CAPSULE | Freq: Once | ORAL | Status: AC
  Administered 2020-01-25: 100 mg via ORAL
  Filled 2020-01-25: qty 1

## 2020-01-25 MED ORDER — DOXYLAMINE SUCCINATE (SLEEP) 25 MG PO TABS
25.0000 mg | ORAL_TABLET | Freq: Every evening | ORAL | Status: DC | PRN
  Administered 2020-01-25: 25 mg via ORAL
  Filled 2020-01-25 (×2): qty 1

## 2020-01-25 MED ORDER — POLYETHYLENE GLYCOL 3350 17 G PO PACK
17.0000 g | PACK | Freq: Every day | ORAL | Status: DC
  Administered 2020-01-25 – 2020-01-26 (×2): 17 g via ORAL
  Filled 2020-01-25 (×2): qty 1

## 2020-01-25 MED ORDER — ENSURE ENLIVE PO LIQD: 237.0000 mL | Freq: Two times a day (BID) | ORAL | Status: DC

## 2020-01-25 NOTE — Progress Notes (Signed)
PROGRESS NOTE    Jill Reyes  WUJ:[id number] DOB: 03/12/1976 DOA: 01/24/2020 PCP: Patient, No Pcp Per    Chief Complaint  Patient presents with  . Tachycardia    Brief Narrative:  44 year old female with no significant medical history who had surgery of perirectal abscess and hemorrhoids about 4 weeks ago presented to the ED with dizziness.  Patient reports that she was nonambulatory for at least 2 weeks following the surgery.  She went for follow-up appointment with the surgeon and was noted to have elevated heart rate and was sent to the ED.  She reports having body aches with poor p.o. intake.  Also complaining of pain in her ankles and calves off-and-on bilaterally.  Denied any urinary symptoms.  Patient was tachycardic with heart rate in 140s, stable blood pressure, afebrile.  Blood work showed WBC of 12 K, elevated D-dimer, low potassium and magnesium.  CT angiogram of the chest negative for PE but showed 3 mm right lower lobe nodule. Patient received a dose of IV Cardizem without much improvement.  Given IV hydration and hospitalist consulted for admission for unexplained sepsis-like symptoms.  Assessment & Plan:   Principal Problem: System inflammatory response syndrome (HCC) Unexplained etiology.  No infection source identified.  UA negative for infection.  Follow blood cultures.  Noted for the low a.m. cortisol.  Thyroid function normal.  Blood pressure remained stable.  Heart rate improving to low 100s on the monitor with IV hydration.   will continue IV fluids for now.  Patient reports alcohol and occasional marijuana use but no other illicit drug use. On empiric meropenem for now, will discontinue in a.m. if blood cultures remain negative and no further concern for sepsis.  Active problems Elevated D-dimer. CT angiogram done in the ED was negative for PE.  Patient complained of bilateral ankle and calf pain for a few weeks.  Doppler lower extremity bilaterally negative for  DVT.  Tobacco abuse with right lower lung nodule Smokes 1 pack/day.  Incidental finding of 3 mm right lower lobe nodule.  Given her smoking history needs follow-up CT in 12 months.  Patient counseled strongly on smoking cessation.  Hypokalemia/hypomagnesemia Replenished   DVT prophylaxis: Subcu Lovenox Code Status: Full code Family Communication: None at bedside.  Will update her mother. Disposition:   Status is: Inpatient  Remains inpatient appropriate because:Inpatient level of care appropriate due to severity of illness   Dispo: The patient is from: Home              Anticipated d/c is to: Home              Anticipated d/c date is: 1 day              Patient currently is not medically stable to d/c.        Consultants:   None   Procedures:   CT angiogram chest  Antimicrobials: Meropenem     Subjective: Seen and examined.  Reports feeling tired.  Denies any flank pain or dysuria.  Objective: Vitals:   01/24/20 1943 01/25/20 0215 01/25/20 0803 01/25/20 1149  BP: (!) 124/94 (!) 139/98 118/85 122/88  Pulse: (!) 110 (!) 107 (!) 103 96  Resp: 20 18 17 17   Temp: 98.8 F (37.1 C) 98.6 F (37 C) 98.6 F (37 C) 98.1 F (36.7 C)  TempSrc: Oral Oral Oral   SpO2: 99% 99% 100% 100%  Weight:  56.8 kg    Height:  Intake/Output Summary (Last 24 hours) at 01/25/2020 1501 Last data filed at 01/25/2020 1452 Gross per 24 hour  Intake --  Output 750 ml  Net -750 ml   Filed Weights   01/24/20 1614 01/24/20 1712 01/25/20 0215  Weight: 57.6 kg 55.5 kg 56.8 kg    Examination:  General: Middle-aged female not in distress, fatigued HEENT: Moist mucosa, supple neck Chest: Clear bilaterally CVs: S1-S2 tachycardic, no murmurs GI: Soft, nondistended, nontender, bowel sounds present Musculoskeletal: Warm, no edema    Data Reviewed: I have personally reviewed following labs and imaging studies  CBC: Recent Labs  Lab 01/24/20 1106 01/25/20 0012  WBC  12.2* 6.5  NEUTROABS 10.1*  --   HGB 15.9* 12.6  HCT 45.2 37.2  MCV 99.8 103.6*  PLT 244 143*    Basic Metabolic Panel: Recent Labs  Lab 01/24/20 1106 01/25/20 0012  NA 135 136  K 3.3* 3.6  CL 94* 100  CO2 22 28  GLUCOSE 136* 90  BUN 9 7  CREATININE 0.48 0.32*  CALCIUM 9.3 8.0*  MG 1.5* 2.3    GFR: Estimated Creatinine Clearance: 82.1 mL/min (A) (by C-G formula based on SCr of 0.32 mg/dL (L)).  Liver Function Tests: Recent Labs  Lab 01/24/20 1106  AST 142*  ALT 28  ALKPHOS 142*  BILITOT 2.0*  PROT 7.6  ALBUMIN 3.7    CBG: No results for input(s): GLUCAP in the last 168 hours.   Recent Results (from the past 240 hour(s))  SARS Coronavirus 2 by RT PCR (hospital order, performed in [facility] hospital lab) Nasopharyngeal Nasopharyngeal Swab     Status: None   Collection Time: 01/24/20  3:06 PM   Specimen: Nasopharyngeal Swab  Result Value Ref Range Status   SARS Coronavirus 2 NEGATIVE NEGATIVE Final    Comment: (NOTE) SARS-CoV-2 target nucleic acids are NOT DETECTED. The SARS-CoV-2 RNA is generally detectable in upper and lower respiratory specimens during the acute phase of infection. The lowest concentration of SARS-CoV-2 viral copies this assay can detect is 250 copies / mL. A negative result does not preclude SARS-CoV-2 infection and should not be used as the sole basis for treatment or other patient management decisions.  A negative result may occur with improper specimen collection / handling, submission of specimen other than nasopharyngeal swab, presence of viral mutation(s) within the areas targeted by this assay, and inadequate number of viral copies (<250 copies / mL). A negative result must be combined with clinical observations, patient history, and epidemiological information. Fact Sheet for Patients:   BoilerBrush.com.cy Fact Sheet for Healthcare Providers: https://pope.com/ This test is not  yet approved or cleared  by the [place] FDA and has been authorized for detection and/or diagnosis of SARS-CoV-2 by FDA under an Emergency Use Authorization (EUA).  This EUA will remain in effect (meaning this test can be used) for the duration of the COVID-19 declaration under Section 564(b)(1) of the Act, 21 U.S.C. section 360bbb-3(b)(1), unless the authorization is terminated or revoked sooner. Performed at [facility], [address] [place], [place] [address]   CULTURE, BLOOD (ROUTINE X 2) w Reflex to ID Panel     Status: None (Preliminary result)   Collection Time: 01/24/20  4:09 PM   Specimen: BLOOD  Result Value Ref Range Status   Specimen Description BLOOD BLOOD LEFT HAND  Final   Special Requests   Final    BOTTLES DRAWN AEROBIC AND ANAEROBIC Blood Culture results may not be optimal due to  an excessive volume of blood received in culture bottles   Culture   Final    NO GROWTH < 24 HOURS Performed at [facility], [address] [place], [place] [address]    Report Status PENDING  Incomplete  CULTURE, BLOOD (ROUTINE X 2) w Reflex to ID Panel     Status: None (Preliminary result)   Collection Time: 01/24/20  4:09 PM   Specimen: BLOOD  Result Value Ref Range Status   Specimen Description BLOOD BLOOD LEFT WRIST  Final   Special Requests   Final    BOTTLES DRAWN AEROBIC AND ANAEROBIC Blood Culture results may not be optimal due to an excessive volume of blood received in culture bottles   Culture   Final    NO GROWTH < 24 HOURS Performed at [facility], [address] [place], [place] [address]    Report Status PENDING  Incomplete         Radiology Studies: CT Angio Chest PE W and/or Wo Contrast  Result Date: 01/24/2020 CLINICAL DATA:  Tachycardia, weakness and dizziness EXAM: CT ANGIOGRAPHY CHEST WITH CONTRAST TECHNIQUE: Multidetector CT imaging of the chest was performed using the standard protocol during bolus administration  of intravenous contrast. Multiplanar CT image reconstructions and MIPs were obtained to evaluate the vascular anatomy. CONTRAST:  19mL OMNIPAQUE IOHEXOL 350 MG/ML SOLN COMPARISON:  None. FINDINGS: Cardiovascular: No filling defects within the pulmonary arteries to suggest acute pulmonary embolism. No acute findings of the aorta or great vessels. No pericardial fluid. Mediastinum/Nodes: No axillary or supraclavicular adenopathy. No mediastinal or hilar adenopathy. No pericardial effusion. Esophagus normal. Lungs/Pleura: No pulmonary infarction. No infiltrate or pleural fluid. No pneumothorax. 3 mm nodule in the RIGHT lower lobe (image 56/series 6). Upper Abdomen: Diffuse hepatic steatosis Musculoskeletal: No acute osseous abnormality. Review of the MIP images confirms the above findings. IMPRESSION: 1. No acute pulmonary embolism. 2. No acute pulmonary parenchymal findings. 3. Small RIGHT lower lobe pulmonary nodule. No follow-up needed if patient is low-risk. Non-contrast chest CT can be considered in 12 months if patient is high-risk. This recommendation follows the consensus statement: Guidelines for Management of Incidental Pulmonary Nodules Detected on CT Images: From the Fleischner Society 2017; Radiology 2017; 284:228-243. Electronically Signed   By: Genevive Bi M.D.   On: 01/24/2020 13:20   US Venous Img Lower Bilateral (DVT)  Result Date: 01/25/2020 CLINICAL DATA:  Bilateral lower extremity pain and edema. Elevated D-dimer. Evaluate for DVT. EXAM: BILATERAL LOWER EXTREMITY VENOUS DOPPLER ULTRASOUND TECHNIQUE: Gray-scale sonography with graded compression, as well as color Doppler and duplex ultrasound were performed to evaluate the lower extremity deep venous systems from the level of the common femoral vein and including the common femoral, femoral, profunda femoral, popliteal and calf veins including the posterior tibial, peroneal and gastrocnemius veins when visible. The superficial great  saphenous vein was also interrogated. Spectral Doppler was utilized to evaluate flow at rest and with distal augmentation maneuvers in the common femoral, femoral and popliteal veins. COMPARISON:  None. FINDINGS: RIGHT LOWER EXTREMITY Common Femoral Vein: No evidence of thrombus. Normal compressibility, respiratory phasicity and response to augmentation. Saphenofemoral Junction: No evidence of thrombus. Normal compressibility and flow on color Doppler imaging. Profunda Femoral Vein: No evidence of thrombus. Normal compressibility and flow on color Doppler imaging. Femoral Vein: No evidence of thrombus. Normal compressibility, respiratory phasicity and response to augmentation. Popliteal Vein: No evidence of thrombus. Normal compressibility, respiratory phasicity and response to augmentation. Calf Veins: No evidence of thrombus.  Normal compressibility and flow on color Doppler imaging. Superficial Great Saphenous Vein: No evidence of thrombus. Normal compressibility. Venous Reflux:  None. Other Findings:  None. LEFT LOWER EXTREMITY Common Femoral Vein: No evidence of thrombus. Normal compressibility, respiratory phasicity and response to augmentation. Saphenofemoral Junction: No evidence of thrombus. Normal compressibility and flow on color Doppler imaging. Profunda Femoral Vein: No evidence of thrombus. Normal compressibility and flow on color Doppler imaging. Femoral Vein: No evidence of thrombus. Normal compressibility, respiratory phasicity and response to augmentation. Popliteal Vein: No evidence of thrombus. Normal compressibility, respiratory phasicity and response to augmentation. Calf Veins: No evidence of thrombus. Normal compressibility and flow on color Doppler imaging. Superficial Great Saphenous Vein: No evidence of thrombus. Normal compressibility. Venous Reflux:  None. Other Findings:  None. IMPRESSION: No evidence of DVT within either lower extremity. Electronically Signed   By: Simonne Come M.D.    On: 01/25/2020 11:38   DG Chest Portable 1 View  Result Date: 01/24/2020 CLINICAL DATA:  Smoker with shortness of breath, tachycardia, weakness and dizziness. EXAM: PORTABLE CHEST 1 VIEW COMPARISON:  None. FINDINGS: Normal sized heart. Clear lungs. Mild hyperexpansion of the lungs with mild peribronchial thickening. Mild scoliosis. Right humeral head bone island. IMPRESSION: Mild changes of COPD and chronic bronchitis.  No acute abnormality. Electronically Signed   By: Beckie Salts M.D.   On: 01/24/2020 11:53        Scheduled Meds: . enoxaparin (LOVENOX) injection  40 mg Subcutaneous Q24H  . loratadine  10 mg Oral Daily  . metoprolol tartrate  25 mg Oral BID  . multivitamin with minerals  1 tablet Oral Daily  . nicotine  21 mg Transdermal Daily   Continuous Infusions: . sodium chloride 150 mL/hr at 01/25/20 1446  . meropenem (MERREM) IV 1 g (01/25/20 1437)     LOS: 1 day    Time spent: 25 minutes    Amaris Delafuente, MD [facility]   To contact the attending provider between 7A-7P or the covering provider during after hours 7P-7A, please log into the web site [email or web address] and access using universal [place] password for that web site. If you do not have the password, please call the hospital operator.  01/25/2020, 3:01 PM

## 2020-01-25 NOTE — Progress Notes (Signed)
Initial Nutrition Assessment  DOCUMENTATION CODES:   Non-severe (moderate) malnutrition in context of social or environmental circumstances  INTERVENTION:  Provide Ensure Enlive po BID, each supplement provides 350 kcal and 20 grams of protein. Patient prefers chocolate.  Provide Magic cup TID with meals, each supplement provides 290 kcal and 9 grams of protein. Patient prefers chocolate.  Continue daily MVI.  Encouraged adequate intake of protein at meals.  NUTRITION DIAGNOSIS:   Moderate Malnutrition related to social / environmental circumstances(poor appetite, inadequate oral intake) as evidenced by mild fat depletion, moderate fat depletion, mild muscle depletion, moderate muscle depletion, 20.3% weight loss over 1 year.  GOAL:   Patient will meet greater than or equal to 90% of their needs  MONITOR:   PO intake, Supplement acceptance, Labs, Weight trends, I & O's  REASON FOR ASSESSMENT:   Malnutrition Screening Tool    ASSESSMENT:   44 year old female with no significant PMHx who had recent fourth degree internal hemorrhoidectomy and I&D of perianal abscess on 12/26/2019 now admitted with SIRS.   Met with patient at bedside. She reports she has had a poor appetite for a while now and is unsure why. She does not eat full meals anymore. She reports she eats in more of a snack pattern. She may have steamed vegetables or snacks. She does report her husband will cook meals with meat and she is able to get protein from those meals. Patient had ordered a salad with chicken for lunch and she was still working on eating at time of RD assessment. Patient is amenable to drinking Ensure to help meet calorie/protein needs. She is also amenable to trying [employer].  Patient reports her UBW was 140 lbs and that she has been losing weight. According to chart patient was 69.4 kg on 02/04/2019, 55.3 kg on 12/22/2019, and is currently 56.8 kg (125.3 lbs). She has lost 14.1 kg (20.3% body weight)  over the past year, which is significant for time frame.  Medications reviewed and include: MVI daily, nicotine patch, NS at 150 mL/hr, meropenem.  Labs reviewed: Creatinine 0.32.  NUTRITION - FOCUSED PHYSICAL EXAM:   Most Recent Value  Orbital Region  No depletion  Upper Arm Region  Moderate depletion  Thoracic and Lumbar Region  Mild depletion  Buccal Region  No depletion  Temple Region  No depletion  Clavicle Bone Region  Mild depletion  Clavicle and Acromion Bone Region  Mild depletion  Scapular Bone Region  No depletion  Dorsal Hand  No depletion  Patellar Region  Moderate depletion  Anterior Thigh Region  Moderate depletion  Posterior Calf Region  Moderate depletion  Edema (RD Assessment)  None  Hair  Reviewed  Eyes  Reviewed  Mouth  Reviewed  Skin  Reviewed  Nails  Reviewed     Diet Order:   Diet Order            Diet regular Room service appropriate? Yes; Fluid consistency: Thin  Diet effective now             EDUCATION NEEDS:   Education needs have been addressed  Skin:  Skin Assessment: Reviewed RN Assessment  Last BM:  01/24/2020  Height:   Ht Readings from Last 1 Encounters:  01/24/20 '5\' 8"'  (1.727 m)   Weight:   Wt Readings from Last 1 Encounters:  01/25/20 56.8 kg   Ideal Body Weight:     BMI:  Body mass index is 19.05 kg/m.  Estimated Nutritional Needs:  Kcal:  1600-1800  Protein:  80-90 grams  Fluid:  1.6-1.8 L/day  Jacklynn Barnacle, MS, RD, LDN Pager number available on Amion

## 2020-01-26 ENCOUNTER — Inpatient Hospital Stay: Payer: Medicaid Other

## 2020-01-26 DIAGNOSIS — R6510 Systemic inflammatory response syndrome (SIRS) of non-infectious origin without acute organ dysfunction: Secondary | ICD-10-CM | POA: Diagnosis present

## 2020-01-26 DIAGNOSIS — I739 Peripheral vascular disease, unspecified: Secondary | ICD-10-CM | POA: Diagnosis present

## 2020-01-26 DIAGNOSIS — F17210 Nicotine dependence, cigarettes, uncomplicated: Secondary | ICD-10-CM

## 2020-01-26 MED ORDER — NICOTINE 21 MG/24HR TD PT24: 21.0000 mg | MEDICATED_PATCH | Freq: Every day | TRANSDERMAL | 0 refills | Status: AC

## 2020-01-26 MED ORDER — ENSURE ENLIVE PO LIQD: 237.0000 mL | Freq: Two times a day (BID) | ORAL | 12 refills | Status: DC

## 2020-01-26 NOTE — Progress Notes (Signed)
IVs and tele removed from patient. Discharge instructions given to patient. Verbalized understanding. No acute distress at this time. Patient to drive herself home.  

## 2020-01-26 NOTE — Discharge Summary (Addendum)
Physician Discharge Summary  [name] ZOX:[id number] DOB: [birth year] DOA: 01/24/2020  PCP: Patient, No Pcp Per  Admit date: 01/24/2020 Discharge date: 01/26/2020  Admitted From: Home Disposition: Home  Recommendations for Outpatient Follow-up:  1. Needs to establish care with PCP in the community. 2. Patient needs CT chest in 12 months to follow-up on pulmonary nodule.  Home Health: None Equipment/Devices: None  Discharge Condition: Fair CODE STATUS: Full code Diet recommendation: Regular   Discharge Diagnoses:  Principal Problem:   SIRS due to non-infectious process without acute organ dysfunction (HCC)   Active Problems:   Hypokalemia   Hypomagnesemia   Nicotine dependence   Lung nodule < 6cm on CT   Malnutrition of moderate degree   Claudication of both lower extremities (HCC)  Brief narrative/HPI 44 year old female with no significant medical history who had surgery of perirectal abscess and hemorrhoids about 4 weeks ago presented to the ED with dizziness.  Patient reports that she was nonambulatory for at least 2 weeks following the surgery.  She went for follow-up appointment with the surgeon and was noted to have elevated heart rate and was sent to the ED.  She reports having body aches with poor p.o. intake.  Also complaining of pain in her ankles and calves off-and-on bilaterally.  Denied any urinary symptoms.  Patient was tachycardic with heart rate in 140s, stable blood pressure, afebrile.  Blood work showed WBC of 12 K, elevated D-dimer, low potassium and magnesium.  CT angiogram of the chest negative for PE but showed 3 mm right lower lobe nodule. Patient received a dose of IV Cardizem without much improvement.  Given IV hydration and hospitalist consulted for admission for unexplained sepsis-like symptoms.   Hospital course  Principal Problem: System inflammatory response syndrome, noninfectious etiology (HCC) No signs of infection.  UA  negative for  infection.  Blood cultures negative for growth. Noted for the low a.m. cortisol.  Thyroid function normal.    Blood pressure has remained stable.  Heart rate improved with IV fluids.  Has history of alcohol use but reports only occasional use and no illicit drug use. Antibiotic discontinued.  Symptoms have resolved and patient can be discharged home.   Active problems Elevated D-dimer. CT angiogram done in the ED was negative for PE.  Patient complained of bilateral ankle and calf pain for a few weeks.  Doppler lower extremity bilaterally negative for DVT.  Tobacco abuse with right lower lung nodule Smokes 1 pack/day.  Incidental finding of 3 mm right lower lobe nodule.  Given her smoking history needs follow-up CT in 12 months.  Patient counseled strongly on smoking cessation.  Nicotine patch prescribed.  Lower extremity claudication Patient complaining of frequent pain in her feet and calves.  Bilateral lower extremity arterial Doppler done given history of heavy smoking was negative for significant stenosis or occlusion.  Hypokalemia/hypomagnesemia Replenished  Protein calorie malnutrition, moderate Added supplement.  Patient clinically stable to discharge.  Instructed to establish care with a PCP in the community for routine medical care, age-appropriate screening and need for follow-up chest CT in 12 months for the lung nodule.  Discharge Instructions   Allergies as of 01/26/2020   No Known Allergies     Medication List    TAKE these medications      feeding supplement (ENSURE ENLIVE) Liqd Take 237 mLs by mouth 2 (two) times daily between meals.   loratadine 10 MG tablet Commonly known as: CLARITIN Take 1 tablet (10 mg total) by mouth daily.  multivitamin with minerals Tabs tablet Take 1 tablet by mouth daily.   nicotine 21 mg/24hr patch Commonly known as: NICODERM CQ - dosed in mg/24 hours Place 1 patch (21 mg total) onto the skin daily. Start taking on: Jan 27, 2020        Follow-up Information    needs to establish care with a PCP in the community locally. Follow up in 2 week(s).          No Known Allergies    Procedures/Studies: CT Angio Chest PE W and/or Wo Contrast  Result Date: 01/24/2020 CLINICAL DATA:  Tachycardia, weakness and dizziness EXAM: CT ANGIOGRAPHY CHEST WITH CONTRAST TECHNIQUE: Multidetector CT imaging of the chest was performed using the standard protocol during bolus administration of intravenous contrast. Multiplanar CT image reconstructions and MIPs were obtained to evaluate the vascular anatomy. CONTRAST:  20mL OMNIPAQUE IOHEXOL 350 MG/ML SOLN COMPARISON:  None. FINDINGS: Cardiovascular: No filling defects within the pulmonary arteries to suggest acute pulmonary embolism. No acute findings of the aorta or great vessels. No pericardial fluid. Mediastinum/Nodes: No axillary or supraclavicular adenopathy. No mediastinal or hilar adenopathy. No pericardial effusion. Esophagus normal. Lungs/Pleura: No pulmonary infarction. No infiltrate or pleural fluid. No pneumothorax. 3 mm nodule in the RIGHT lower lobe (image 56/series 6). Upper Abdomen: Diffuse hepatic steatosis Musculoskeletal: No acute osseous abnormality. Review of the MIP images confirms the above findings. IMPRESSION: 1. No acute pulmonary embolism. 2. No acute pulmonary parenchymal findings. 3. Small RIGHT lower lobe pulmonary nodule. No follow-up needed if patient is low-risk. Non-contrast chest CT can be considered in 12 months if patient is high-risk. This recommendation follows the consensus statement: Guidelines for Management of Incidental Pulmonary Nodules Detected on CT Images: From the Fleischner Society 2017; Radiology 2017; 284:228-243. Electronically Signed   By: Suzy Bouchard M.D.   On: 01/24/2020 13:20   US Venous Img Lower Bilateral (DVT)  Result Date: 01/25/2020 CLINICAL DATA:  Bilateral lower extremity pain and edema. Elevated D-dimer. Evaluate for  DVT. EXAM: BILATERAL LOWER EXTREMITY VENOUS DOPPLER ULTRASOUND TECHNIQUE: Gray-scale sonography with graded compression, as well as color Doppler and duplex ultrasound were performed to evaluate the lower extremity deep venous systems from the level of the common femoral vein and including the common femoral, femoral, profunda femoral, popliteal and calf veins including the posterior tibial, peroneal and gastrocnemius veins when visible. The superficial great saphenous vein was also interrogated. Spectral Doppler was utilized to evaluate flow at rest and with distal augmentation maneuvers in the common femoral, femoral and popliteal veins. COMPARISON:  None. FINDINGS: RIGHT LOWER EXTREMITY Common Femoral Vein: No evidence of thrombus. Normal compressibility, respiratory phasicity and response to augmentation. Saphenofemoral Junction: No evidence of thrombus. Normal compressibility and flow on color Doppler imaging. Profunda Femoral Vein: No evidence of thrombus. Normal compressibility and flow on color Doppler imaging. Femoral Vein: No evidence of thrombus. Normal compressibility, respiratory phasicity and response to augmentation. Popliteal Vein: No evidence of thrombus. Normal compressibility, respiratory phasicity and response to augmentation. Calf Veins: No evidence of thrombus. Normal compressibility and flow on color Doppler imaging. Superficial Great Saphenous Vein: No evidence of thrombus. Normal compressibility. Venous Reflux:  None. Other Findings:  None. LEFT LOWER EXTREMITY Common Femoral Vein: No evidence of thrombus. Normal compressibility, respiratory phasicity and response to augmentation. Saphenofemoral Junction: No evidence of thrombus. Normal compressibility and flow on color Doppler imaging. Profunda Femoral Vein: No evidence of thrombus. Normal compressibility and flow on color Doppler imaging. Femoral Vein: No evidence of thrombus. Normal  compressibility, respiratory phasicity and response to  augmentation. Popliteal Vein: No evidence of thrombus. Normal compressibility, respiratory phasicity and response to augmentation. Calf Veins: No evidence of thrombus. Normal compressibility and flow on color Doppler imaging. Superficial Great Saphenous Vein: No evidence of thrombus. Normal compressibility. Venous Reflux:  None. Other Findings:  None. IMPRESSION: No evidence of DVT within either lower extremity. Electronically Signed   By: Simonne Come M.D.   On: 01/25/2020 11:38   DG Chest Portable 1 View  Result Date: 01/24/2020 CLINICAL DATA:  Smoker with shortness of breath, tachycardia, weakness and dizziness. EXAM: PORTABLE CHEST 1 VIEW COMPARISON:  None. FINDINGS: Normal sized heart. Clear lungs. Mild hyperexpansion of the lungs with mild peribronchial thickening. Mild scoliosis. Right humeral head bone island. IMPRESSION: Mild changes of COPD and chronic bronchitis.  No acute abnormality. Electronically Signed   By: Beckie Salts M.D.   On: 01/24/2020 11:53       Subjective: Not in distress.  Complains of intermittent pain in her legs.  Heart rate stable on the monitor.  Discharge Exam: Vitals:   01/25/20 2240 01/26/20 0317  BP: 116/81 (!) 124/96  Pulse: 92 (!) 101  Resp:  20  Temp:  98.4 F (36.9 C)  SpO2:  98%   Vitals:   01/25/20 1149 01/25/20 1607 01/25/20 2240 01/26/20 0317  BP: 122/88 (!) 134/99 116/81 (!) 124/96  Pulse: 96 98 92 (!) 101  Resp: 17 17  20   Temp: 98.1 F (36.7 C) 98.4 F (36.9 C)  98.4 F (36.9 C)  TempSrc:    Oral  SpO2: 100% 100%  98%  Weight:      Height:        General: Middle-aged female not in distress HEENT: Moist with a, supple neck Chest: Clear CVs: Normal S1-S2 GI: Soft, nondistended, nontender Musculoskeletal: Warm, no swelling of the feet, calves, warmth or tenderness.  Normal sensation.  Distal pulses palpable bilaterally    The results of significant diagnostics from this hospitalization (including imaging, microbiology,  ancillary and laboratory) are listed below for reference.     Microbiology: Recent Results (from the past 240 hour(s))  SARS Coronavirus 2 by RT PCR (hospital order, performed in [facility] hospital lab) Nasopharyngeal Nasopharyngeal Swab     Status: None   Collection Time: 01/24/20  3:06 PM   Specimen: Nasopharyngeal Swab  Result Value Ref Range Status   SARS Coronavirus 2 NEGATIVE NEGATIVE Final    Comment: (NOTE) SARS-CoV-2 target nucleic acids are NOT DETECTED. The SARS-CoV-2 RNA is generally detectable in upper and lower respiratory specimens during the acute phase of infection. The lowest concentration of SARS-CoV-2 viral copies this assay can detect is 250 copies / mL. A negative result does not preclude SARS-CoV-2 infection and should not be used as the sole basis for treatment or other patient management decisions.  A negative result may occur with improper specimen collection / handling, submission of specimen other than nasopharyngeal swab, presence of viral mutation(s) within the areas targeted by this assay, and inadequate number of viral copies (<250 copies / mL). A negative result must be combined with clinical observations, patient history, and epidemiological information. Fact Sheet for Patients:   01/26/20 Fact Sheet for Healthcare Providers: BoilerBrush.com.cy This test is not yet approved or cleared  by the https://pope.com/ FDA and has been authorized for detection and/or diagnosis of SARS-CoV-2 by FDA under an Emergency Use Authorization (EUA).  This EUA will remain in effect (meaning this test can be used)  for the duration of the COVID-19 declaration under Section 564(b)(1) of the Act, 21 U.S.C. section 360bbb-3(b)(1), unless the authorization is terminated or revoked sooner. Performed at [facility], [address] [place], [place] [address]   CULTURE, BLOOD (ROUTINE X 2) w Reflex to ID Panel      Status: None (Preliminary result)   Collection Time: 01/24/20  4:09 PM   Specimen: BLOOD  Result Value Ref Range Status   Specimen Description BLOOD BLOOD LEFT HAND  Final   Special Requests   Final    BOTTLES DRAWN AEROBIC AND ANAEROBIC Blood Culture results may not be optimal due to an excessive volume of blood received in culture bottles   Culture   Final    NO GROWTH 2 DAYS Performed at [facility], [address] [place], [place] [address]    Report Status PENDING  Incomplete  CULTURE, BLOOD (ROUTINE X 2) w Reflex to ID Panel     Status: None (Preliminary result)   Collection Time: 01/24/20  4:09 PM   Specimen: BLOOD  Result Value Ref Range Status   Specimen Description BLOOD BLOOD LEFT WRIST  Final   Special Requests   Final    BOTTLES DRAWN AEROBIC AND ANAEROBIC Blood Culture results may not be optimal due to an excessive volume of blood received in culture bottles   Culture   Final    NO GROWTH 2 DAYS Performed at [facility], [address] [place], [place] [address]    Report Status PENDING  Incomplete     Labs: BNP (last 3 results) No results for input(s): BNP in the last 8760 hours. Basic Metabolic Panel: Recent Labs  Lab 01/24/20 1106 01/25/20 0012  NA 135 136  K 3.3* 3.6  CL 94* 100  CO2 22 28  GLUCOSE 136* 90  BUN 9 7  CREATININE 0.48 0.32*  CALCIUM 9.3 8.0*  MG 1.5* 2.3   Liver Function Tests: Recent Labs  Lab 01/24/20 1106  AST 142*  ALT 28  ALKPHOS 142*  BILITOT 2.0*  PROT 7.6  ALBUMIN 3.7   No results for input(s): LIPASE, AMYLASE in the last 168 hours. No results for input(s): AMMONIA in the last 168 hours. CBC: Recent Labs  Lab 01/24/20 1106 01/25/20 0012  WBC 12.2* 6.5  NEUTROABS 10.1*  --   HGB 15.9* 12.6  HCT 45.2 37.2  MCV 99.8 103.6*  PLT 244 143*   Cardiac Enzymes: Recent Labs  Lab 01/25/20 0012  CKTOTAL 35*   BNP: Invalid input(s): POCBNP CBG: No results for input(s): GLUCAP in the  last 168 hours. D-Dimer No results for input(s): DDIMER in the last 72 hours. Hgb A1c No results for input(s): HGBA1C in the last 72 hours. Lipid Profile No results for input(s): CHOL, HDL, LDLCALC, TRIG, CHOLHDL, LDLDIRECT in the last 72 hours. Thyroid function studies Recent Labs    01/24/20 1106  TSH 1.679   Anemia work up Recent Labs    01/25/20 1752  VITAMINB12 511  TIBC 238*  IRON 50   Urinalysis    Component Value Date/Time   COLORURINE AMBER (A) 01/25/2020 0232   APPEARANCEUR HAZY (A) 01/25/2020 0232   LABSPEC 1.036 (H) 01/25/2020 0232   PHURINE 6.0 01/25/2020 0232   GLUCOSEU NEGATIVE 01/25/2020 0232   HGBUR LARGE (A) 01/25/2020 0232   BILIRUBINUR NEGATIVE 01/25/2020 0232   KETONESUR 5 (A) 01/25/2020 0232   PROTEINUR NEGATIVE 01/25/2020 0232   NITRITE NEGATIVE 01/25/2020 0232   LEUKOCYTESUR  NEGATIVE 01/25/2020 0232   Sepsis Labs Invalid input(s): PROCALCITONIN,  WBC,  LACTICIDVEN Microbiology Recent Results (from the past 240 hour(s))  SARS Coronavirus 2 by RT PCR (hospital order, performed in [facility] hospital lab) Nasopharyngeal Nasopharyngeal Swab     Status: None   Collection Time: 01/24/20  3:06 PM   Specimen: Nasopharyngeal Swab  Result Value Ref Range Status   SARS Coronavirus 2 NEGATIVE NEGATIVE Final    Comment: (NOTE) SARS-CoV-2 target nucleic acids are NOT DETECTED. The SARS-CoV-2 RNA is generally detectable in upper and lower respiratory specimens during the acute phase of infection. The lowest concentration of SARS-CoV-2 viral copies this assay can detect is 250 copies / mL. A negative result does not preclude SARS-CoV-2 infection and should not be used as the sole basis for treatment or other patient management decisions.  A negative result may occur with improper specimen collection / handling, submission of specimen other than nasopharyngeal swab, presence of viral mutation(s) within the areas targeted by this assay, and inadequate  number of viral copies (<250 copies / mL). A negative result must be combined with clinical observations, patient history, and epidemiological information. Fact Sheet for Patients:   BoilerBrush.com.cy Fact Sheet for Healthcare Providers: https://pope.com/ This test is not yet approved or cleared  by the [place] FDA and has been authorized for detection and/or diagnosis of SARS-CoV-2 by FDA under an Emergency Use Authorization (EUA).  This EUA will remain in effect (meaning this test can be used) for the duration of the COVID-19 declaration under Section 564(b)(1) of the Act, 21 U.S.C. section 360bbb-3(b)(1), unless the authorization is terminated or revoked sooner. Performed at [facility], [address] [place], [place] [address]   CULTURE, BLOOD (ROUTINE X 2) w Reflex to ID Panel     Status: None (Preliminary result)   Collection Time: 01/24/20  4:09 PM   Specimen: BLOOD  Result Value Ref Range Status   Specimen Description BLOOD BLOOD LEFT HAND  Final   Special Requests   Final    BOTTLES DRAWN AEROBIC AND ANAEROBIC Blood Culture results may not be optimal due to an excessive volume of blood received in culture bottles   Culture   Final    NO GROWTH 2 DAYS Performed at [facility], [address] [place], [place] [address]    Report Status PENDING  Incomplete  CULTURE, BLOOD (ROUTINE X 2) w Reflex to ID Panel     Status: None (Preliminary result)   Collection Time: 01/24/20  4:09 PM   Specimen: BLOOD  Result Value Ref Range Status   Specimen Description BLOOD BLOOD LEFT WRIST  Final   Special Requests   Final    BOTTLES DRAWN AEROBIC AND ANAEROBIC Blood Culture results may not be optimal due to an excessive volume of blood received in culture bottles   Culture   Final    NO GROWTH 2 DAYS Performed at [facility], [address], [place], [place] [address]    Report Status PENDING   Incomplete     Time coordinating discharge: 35 minutes  SIGNED:   Eddie North, MD  [facility] 01/26/2020, 10:56 AM Pager   If 7PM-7AM, please contact night-coverage [email or web address] Password [email or web address]

## 2020-01-26 NOTE — Discharge Instructions (Signed)

## 2020-01-29 LAB — CULTURE, BLOOD (ROUTINE X 2)
Culture: NO GROWTH
Culture: NO GROWTH

## 2020-02-15 ENCOUNTER — Telehealth: Payer: Self-pay | Admitting: *Deleted

## 2020-02-15 NOTE — Telephone Encounter (Signed)
Attempted to contact pt to discuss scheduling follow up appts in the lung nodule clinic to follow up lung nodule found on recent imaging during ED visit. Pt did not answer and unable to leave a message. Will try again at a later date.

## 2020-04-04 ENCOUNTER — Emergency Department: Payer: Medicaid Other

## 2020-04-04 ENCOUNTER — Other Ambulatory Visit: Payer: Self-pay

## 2020-04-04 ENCOUNTER — Inpatient Hospital Stay: Payer: Medicaid Other

## 2020-04-04 ENCOUNTER — Inpatient Hospital Stay
Admission: EM | Admit: 2020-04-04 | Discharge: 2020-04-15 | DRG: 432 | Disposition: A | Discharge status: Home Health | Payer: Medicaid Other | Attending: Internal Medicine | Admitting: Internal Medicine

## 2020-04-04 DIAGNOSIS — K7031 Alcoholic cirrhosis of liver with ascites: Secondary | ICD-10-CM | POA: Diagnosis present

## 2020-04-04 DIAGNOSIS — I959 Hypotension, unspecified: Secondary | ICD-10-CM | POA: Diagnosis not present

## 2020-04-04 DIAGNOSIS — K766 Portal hypertension: Secondary | ICD-10-CM | POA: Diagnosis present

## 2020-04-04 DIAGNOSIS — E43 Unspecified severe protein-calorie malnutrition: Secondary | ICD-10-CM | POA: Diagnosis present

## 2020-04-04 DIAGNOSIS — I70213 Atherosclerosis of native arteries of extremities with intermittent claudication, bilateral legs: Secondary | ICD-10-CM | POA: Diagnosis present

## 2020-04-04 DIAGNOSIS — K828 Other specified diseases of gallbladder: Secondary | ICD-10-CM | POA: Diagnosis present

## 2020-04-04 DIAGNOSIS — R188 Other ascites: Secondary | ICD-10-CM

## 2020-04-04 DIAGNOSIS — K7011 Alcoholic hepatitis with ascites: Secondary | ICD-10-CM | POA: Diagnosis present

## 2020-04-04 DIAGNOSIS — R17 Unspecified jaundice: Secondary | ICD-10-CM

## 2020-04-04 DIAGNOSIS — K3189 Other diseases of stomach and duodenum: Secondary | ICD-10-CM | POA: Diagnosis present

## 2020-04-04 DIAGNOSIS — J9601 Acute respiratory failure with hypoxia: Secondary | ICD-10-CM | POA: Diagnosis not present

## 2020-04-04 DIAGNOSIS — Z681 Body mass index (BMI) 19 or less, adult: Secondary | ICD-10-CM | POA: Diagnosis not present

## 2020-04-04 DIAGNOSIS — D6959 Other secondary thrombocytopenia: Secondary | ICD-10-CM | POA: Diagnosis present

## 2020-04-04 DIAGNOSIS — Z886 Allergy status to analgesic agent status: Secondary | ICD-10-CM | POA: Diagnosis not present

## 2020-04-04 DIAGNOSIS — K7200 Acute and subacute hepatic failure without coma: Secondary | ICD-10-CM

## 2020-04-04 DIAGNOSIS — K7040 Alcoholic hepatic failure without coma: Secondary | ICD-10-CM | POA: Diagnosis present

## 2020-04-04 DIAGNOSIS — D649 Anemia, unspecified: Secondary | ICD-10-CM | POA: Diagnosis present

## 2020-04-04 DIAGNOSIS — I8510 Secondary esophageal varices without bleeding: Secondary | ICD-10-CM | POA: Diagnosis present

## 2020-04-04 DIAGNOSIS — K760 Fatty (change of) liver, not elsewhere classified: Secondary | ICD-10-CM | POA: Diagnosis present

## 2020-04-04 DIAGNOSIS — K658 Other peritonitis: Secondary | ICD-10-CM | POA: Diagnosis not present

## 2020-04-04 DIAGNOSIS — F17210 Nicotine dependence, cigarettes, uncomplicated: Secondary | ICD-10-CM | POA: Diagnosis present

## 2020-04-04 DIAGNOSIS — Z20822 Contact with and (suspected) exposure to covid-19: Secondary | ICD-10-CM | POA: Diagnosis present

## 2020-04-04 DIAGNOSIS — E876 Hypokalemia: Secondary | ICD-10-CM | POA: Diagnosis present

## 2020-04-04 DIAGNOSIS — K7010 Alcoholic hepatitis without ascites: Secondary | ICD-10-CM | POA: Diagnosis present

## 2020-04-04 DIAGNOSIS — R531 Weakness: Secondary | ICD-10-CM | POA: Diagnosis present

## 2020-04-04 DIAGNOSIS — N179 Acute kidney failure, unspecified: Secondary | ICD-10-CM | POA: Diagnosis present

## 2020-04-04 DIAGNOSIS — F17200 Nicotine dependence, unspecified, uncomplicated: Secondary | ICD-10-CM | POA: Diagnosis present

## 2020-04-04 DIAGNOSIS — E860 Dehydration: Secondary | ICD-10-CM | POA: Diagnosis not present

## 2020-04-04 DIAGNOSIS — E871 Hypo-osmolality and hyponatremia: Secondary | ICD-10-CM | POA: Diagnosis present

## 2020-04-04 DIAGNOSIS — D539 Nutritional anemia, unspecified: Secondary | ICD-10-CM | POA: Diagnosis not present

## 2020-04-04 DIAGNOSIS — B3789 Other sites of candidiasis: Secondary | ICD-10-CM | POA: Diagnosis not present

## 2020-04-04 DIAGNOSIS — T17908A Unspecified foreign body in respiratory tract, part unspecified causing other injury, initial encounter: Secondary | ICD-10-CM

## 2020-04-04 DIAGNOSIS — J690 Pneumonitis due to inhalation of food and vomit: Secondary | ICD-10-CM | POA: Diagnosis not present

## 2020-04-04 DIAGNOSIS — T380X5A Adverse effect of glucocorticoids and synthetic analogues, initial encounter: Secondary | ICD-10-CM | POA: Diagnosis not present

## 2020-04-04 DIAGNOSIS — D72829 Elevated white blood cell count, unspecified: Secondary | ICD-10-CM | POA: Diagnosis not present

## 2020-04-04 LAB — CBC
HCT: 22.2 % — ABNORMAL LOW (ref 36.0–46.0)
HCT: 20.5 % — ABNORMAL LOW (ref 36.0–46.0)
Hemoglobin: 7.9 g/dL — ABNORMAL LOW (ref 12.0–15.0)
Hemoglobin: 7.6 g/dL — ABNORMAL LOW (ref 12.0–15.0)
MCH: 36.2 pg — ABNORMAL HIGH (ref 26.0–34.0)
MCH: 36.9 pg — ABNORMAL HIGH (ref 26.0–34.0)
MCHC: 35.6 g/dL (ref 30.0–36.0)
MCHC: 37.1 g/dL — ABNORMAL HIGH (ref 30.0–36.0)
MCV: 101.8 fL — ABNORMAL HIGH (ref 80.0–100.0)
MCV: 99.5 fL (ref 80.0–100.0)
Platelets: 124 10*3/uL — ABNORMAL LOW (ref 150–400)
Platelets: 118 10*3/uL — ABNORMAL LOW (ref 150–400)
RBC: 2.18 MIL/uL — ABNORMAL LOW (ref 3.87–5.11)
RBC: 2.06 MIL/uL — ABNORMAL LOW (ref 3.87–5.11)
RDW: 16.7 % — ABNORMAL HIGH (ref 11.5–15.5)
RDW: 17.1 % — ABNORMAL HIGH (ref 11.5–15.5)
WBC: 13.7 10*3/uL — ABNORMAL HIGH (ref 4.0–10.5)
WBC: 12.3 10*3/uL — ABNORMAL HIGH (ref 4.0–10.5)
nRBC: 0.2 % (ref 0.0–0.2)
nRBC: 0.2 % (ref 0.0–0.2)

## 2020-04-04 LAB — HEPATIC FUNCTION PANEL
ALT: 50 U/L — ABNORMAL HIGH (ref 0–44)
AST: 174 U/L — ABNORMAL HIGH (ref 15–41)
Albumin: 2.5 g/dL — ABNORMAL LOW (ref 3.5–5.0)
Alkaline Phosphatase: 146 U/L — ABNORMAL HIGH (ref 38–126)
Bilirubin, Direct: 11.8 mg/dL — ABNORMAL HIGH (ref 0.0–0.2)
Indirect Bilirubin: 8.3 mg/dL — ABNORMAL HIGH (ref 0.3–0.9)
Total Bilirubin: 20.1 mg/dL (ref 0.3–1.2)
Total Protein: 5.7 g/dL — ABNORMAL LOW (ref 6.5–8.1)

## 2020-04-04 LAB — PHOSPHORUS: Phosphorus: UNDETERMINED mg/dL (ref 2.5–4.6)

## 2020-04-04 LAB — LIPASE, BLOOD: Lipase: 49 U/L (ref 11–51)

## 2020-04-04 LAB — AMMONIA

## 2020-04-04 LAB — PROTIME-INR
INR: 1.3 — ABNORMAL HIGH (ref 0.8–1.2)
Prothrombin Time: 16.1 seconds — ABNORMAL HIGH (ref 11.4–15.2)

## 2020-04-04 LAB — BASIC METABOLIC PANEL
Anion gap: 20 — ABNORMAL HIGH (ref 5–15)
BUN: 50 mg/dL — ABNORMAL HIGH (ref 6–20)
CO2: 31 mmol/L (ref 22–32)
Calcium: 8.4 mg/dL — ABNORMAL LOW (ref 8.9–10.3)
Chloride: 68 mmol/L — ABNORMAL LOW (ref 98–111)
Creatinine, Ser: 1.55 mg/dL — ABNORMAL HIGH (ref 0.44–1.00)
GFR calc Af Amer: 47 mL/min — ABNORMAL LOW (ref >60)
GFR calc non Af Amer: 41 mL/min — ABNORMAL LOW (ref >60)
Glucose, Bld: 99 mg/dL (ref 70–99)
Potassium: 2.6 mmol/L — CL (ref 3.5–5.1)
Sodium: 119 mmol/L — CL (ref 135–145)

## 2020-04-04 LAB — SARS CORONAVIRUS 2 BY RT PCR (HOSPITAL ORDER, PERFORMED IN ~~LOC~~ HOSPITAL LAB): SARS Coronavirus 2: NEGATIVE

## 2020-04-04 LAB — MAGNESIUM: Magnesium: 2.6 mg/dL — ABNORMAL HIGH (ref 1.7–2.4)

## 2020-04-04 LAB — ACETAMINOPHEN LEVEL: Acetaminophen (Tylenol), Serum: <10 ug/mL — ABNORMAL LOW (ref 10–30)

## 2020-04-04 MED ORDER — ONDANSETRON HCL 4 MG/2ML IJ SOLN
4.0000 mg | Freq: Four times a day (QID) | INTRAMUSCULAR | Status: DC | PRN
  Administered 2020-04-13: 4 mg via INTRAVENOUS
  Filled 2020-04-04 (×2): qty 2

## 2020-04-04 MED ORDER — THIAMINE HCL 100 MG/ML IJ SOLN
Freq: Once | INTRAVENOUS | Status: AC
  Filled 2020-04-04: qty 1000

## 2020-04-04 MED ORDER — SODIUM CHLORIDE 0.9% FLUSH: 3.0000 mL | Freq: Once | INTRAVENOUS | Status: DC

## 2020-04-04 MED ORDER — PANTOPRAZOLE SODIUM 40 MG IV SOLR
40.0000 mg | Freq: Two times a day (BID) | INTRAVENOUS | Status: DC
  Administered 2020-04-07 – 2020-04-15 (×16): 40 mg via INTRAVENOUS
  Filled 2020-04-04 (×16): qty 40

## 2020-04-04 MED ORDER — LORAZEPAM 1 MG PO TABS
1.0000 mg | ORAL_TABLET | ORAL | Status: AC | PRN
  Administered 2020-04-04 – 2020-04-06 (×2): 2 mg via ORAL
  Filled 2020-04-04 (×2): qty 2

## 2020-04-04 MED ORDER — SODIUM CHLORIDE 0.9% FLUSH
10.0000 mL | INTRAVENOUS | Status: DC | PRN
  Administered 2020-04-09: 10 mL

## 2020-04-04 MED ORDER — THIAMINE HCL 100 MG/ML IJ SOLN
100.0000 mg | Freq: Every day | INTRAMUSCULAR | Status: DC
  Administered 2020-04-06: 100 mg via INTRAVENOUS
  Filled 2020-04-04 (×2): qty 2

## 2020-04-04 MED ORDER — CHLORHEXIDINE GLUCONATE CLOTH 2 % EX PADS
6.0000 | MEDICATED_PAD | Freq: Every day | CUTANEOUS | Status: DC
  Administered 2020-04-05 – 2020-04-14 (×9): 6 via TOPICAL

## 2020-04-04 MED ORDER — SODIUM CHLORIDE 0.9 % IV SOLN
80.0000 mg | Freq: Once | INTRAVENOUS | Status: AC
  Administered 2020-04-04: 80 mg via INTRAVENOUS
  Filled 2020-04-04: qty 80

## 2020-04-04 MED ORDER — SODIUM CHLORIDE 0.9 % IV SOLN
2.0000 g | INTRAVENOUS | Status: DC
  Administered 2020-04-04 – 2020-04-06 (×3): 2 g via INTRAVENOUS
  Filled 2020-04-04 (×2): qty 20
  Filled 2020-04-04: qty 2
  Filled 2020-04-04: qty 20

## 2020-04-04 MED ORDER — THIAMINE HCL 100 MG PO TABS
100.0000 mg | ORAL_TABLET | Freq: Every day | ORAL | Status: DC
  Administered 2020-04-04 – 2020-04-15 (×11): 100 mg via ORAL
  Filled 2020-04-04 (×12): qty 1

## 2020-04-04 MED ORDER — LORAZEPAM 2 MG/ML IJ SOLN
0.0000 mg | INTRAMUSCULAR | Status: AC
  Administered 2020-04-05: 2 mg via INTRAVENOUS
  Filled 2020-04-04: qty 1

## 2020-04-04 MED ORDER — ADULT MULTIVITAMIN W/MINERALS CH
1.0000 | ORAL_TABLET | Freq: Every day | ORAL | Status: DC
  Administered 2020-04-04 – 2020-04-15 (×12): 1 via ORAL
  Filled 2020-04-04 (×12): qty 1

## 2020-04-04 MED ORDER — ONDANSETRON HCL 4 MG PO TABS
4.0000 mg | ORAL_TABLET | Freq: Four times a day (QID) | ORAL | Status: DC | PRN
  Administered 2020-04-14: 4 mg via ORAL
  Filled 2020-04-04: qty 1

## 2020-04-04 MED ORDER — PREDNISONE 20 MG PO TABS
40.0000 mg | ORAL_TABLET | Freq: Every day | ORAL | Status: DC
  Administered 2020-04-04 – 2020-04-15 (×11): 40 mg via ORAL
  Filled 2020-04-04 (×11): qty 2

## 2020-04-04 MED ORDER — SODIUM CHLORIDE 0.9 % IV SOLN
8.0000 mg/h | INTRAVENOUS | Status: AC
  Administered 2020-04-04 – 2020-04-07 (×5): 8 mg/h via INTRAVENOUS
  Filled 2020-04-04 (×6): qty 80

## 2020-04-04 MED ORDER — NICOTINE 21 MG/24HR TD PT24
21.0000 mg | MEDICATED_PATCH | Freq: Every day | TRANSDERMAL | Status: DC
  Administered 2020-04-04 – 2020-04-15 (×12): 21 mg via TRANSDERMAL
  Filled 2020-04-04 (×12): qty 1

## 2020-04-04 MED ORDER — POTASSIUM CHLORIDE CRYS ER 20 MEQ PO TBCR
40.0000 meq | EXTENDED_RELEASE_TABLET | Freq: Three times a day (TID) | ORAL | Status: AC
  Administered 2020-04-04 – 2020-04-05 (×3): 40 meq via ORAL
  Filled 2020-04-04 (×3): qty 2

## 2020-04-04 MED ORDER — LORAZEPAM 2 MG/ML IJ SOLN
1.0000 mg | INTRAMUSCULAR | Status: AC | PRN
  Administered 2020-04-07: 1 mg via INTRAVENOUS

## 2020-04-04 MED ORDER — FOLIC ACID 1 MG PO TABS
1.0000 mg | ORAL_TABLET | Freq: Every day | ORAL | Status: DC
  Administered 2020-04-04 – 2020-04-15 (×12): 1 mg via ORAL
  Filled 2020-04-04 (×12): qty 1

## 2020-04-04 MED ORDER — LORAZEPAM 2 MG/ML IJ SOLN
0.0000 mg | Freq: Three times a day (TID) | INTRAMUSCULAR | Status: AC
  Filled 2020-04-04: qty 1

## 2020-04-04 MED ORDER — SODIUM CHLORIDE 0.9 % IV BOLUS
500.0000 mL | Freq: Once | INTRAVENOUS | Status: AC
  Administered 2020-04-04: 500 mL via INTRAVENOUS

## 2020-04-04 MED ORDER — SODIUM CHLORIDE 0.9 % IV SOLN: Freq: Once | INTRAVENOUS | Status: AC

## 2020-04-04 NOTE — ED Notes (Signed)
US at bedside

## 2020-04-04 NOTE — ED Notes (Signed)
Dr Joylene Igo informed of pt requesting nicotine patch

## 2020-04-04 NOTE — ED Notes (Signed)
Transport request placed at this time.

## 2020-04-04 NOTE — H&P (Addendum)
History and Physical    Jill Reyes EQA:[id number] DOB: October 22, 1975 DOA: 04/04/2020  PCP: Patient, No Pcp Per   Patient coming from: Home  I have personally briefly reviewed patient's old medical records in [facility]  Chief Complaint: Weakness  HPI: Jill Reyes is a 44 y.o. female with medical history significant for alcohol abuse who presents to the ER for evaluation of weakness which has progressively worsened over the last several weeks. Patient states that she was drinking a few bottle of vodka daily but quit about 2 weeks prior to her admission. Patient states that she has not felt well for weeks.  She complains of nausea, vomiting and anorexia.  Over the last 2 days she noted abdominal distention with yellow discoloration of her skin and eyes which prompted her visit to the ER.   Labs reveal sodium of 119, potassium of 2.6, BUN of 50, creatinine of 1.5 from a baseline of 0.3, total bilirubin of 20.1.  Hemoglobin is 7.9 down from 12.6 and platelet count is 124, white count 13.7. Gallbladder ultrasound shows mild ascites. Fatty liver. Gallbladder sludge. Chest x-ray reviewed by me shows atelectasis right lung base. Twelve-lead EKG reviewed by me shows sinus tachycardia   ED Course: Patient is a 44 year old female with a history of alcohol abuse who presents to the emergency room for evaluation of yellow discoloration of her skin and sclera.  Patient has multiple electrolyte abnormalities, hyperbilirubinemia, acute kidney injury and anemia.  She will be admitted to the hospital for further evaluation.  Review of Systems: As per HPI otherwise 10 point review of systems negative.    History reviewed. No pertinent past medical history.  Past Surgical History:  Procedure Laterality Date  . CARPAL TUNNEL RELEASE    . CESAREAN SECTION    . HEMORRHOID SURGERY N/A 12/25/2019   Procedure: HEMORRHOIDECTOMY;  Surgeon: Sung Amabile, DO;  Location: [facility] ORS;  Service: General;   Laterality: N/A;  . INCISION AND DRAINAGE PERIRECTAL ABSCESS N/A 12/25/2019   Procedure: IRRIGATION AND DEBRIDEMENT PERIRECTAL ABSCESS;  Surgeon: Sung Amabile, DO;  Location: [facility] ORS;  Service: General;  Laterality: N/A;  . TUBAL LIGATION       reports that she has been smoking cigarettes. She has never used smokeless tobacco. She reports current alcohol use. She reports previous drug use.  Allergies  Allergen Reactions  . Acetaminophen Shortness Of Breath    No family history on file.   Prior to Admission medications   Medication Sig Start Date End Date Taking? Authorizing Provider  loratadine (CLARITIN) 10 MG tablet Take 1 tablet (10 mg total) by mouth daily. 12/26/19  Yes Esaw Grandchild A, DO  nicotine (NICODERM CQ - DOSED IN MG/24 HOURS) 21 mg/24hr patch Place 1 patch (21 mg total) onto the skin daily. 01/27/20  Yes Dhungel, Theda Belfast, MD    Physical Exam: Vitals:   04/04/20 0803 04/04/20 0804 04/04/20 1104 04/04/20 1201  BP: (!) 93/48  102/64 100/64  Pulse: (!) 101  97 100  Resp: 16  16 (!) 24  Temp: 97.9 F (36.6 C)     TempSrc: Oral     SpO2: 98%  99% 99%  Weight:  54.4 kg    Height:  5\' 8"  (1.727 m)       Vitals:   04/04/20 0803 04/04/20 0804 04/04/20 1104 04/04/20 1201  BP: (!) 93/48  102/64 100/64  Pulse: (!) 101  97 100  Resp: 16  16 (!) 24  Temp: 97.9 F (36.6 C)  TempSrc: Oral     SpO2: 98%  99% 99%  Weight:  54.4 kg    Height:  5\' 8"  (1.727 m)      Constitutional: NAD, alert and oriented x 3. Chronically ill appearing Eyes: PERRL, lids and conjunctivae pallor, icteric sclera ENMT: Mucous membranes are moist.  Neck: normal, supple, no masses, no thyromegaly Respiratory: clear to auscultation bilaterally, no wheezing, no crackles. Normal respiratory effort. No accessory muscle use.  Cardiovascular: Regular rate and rhythm, no murmurs / rubs / gallops. No extremity edema. 2+ pedal pulses. No carotid bruits.  Abdomen: no tenderness, no masses  palpated. No hepatosplenomegaly. Bowel sounds positive.  Musculoskeletal: no clubbing / cyanosis. No joint deformity upper and lower extremities.  Skin: scattered petechial rashes on arms and chest, lesions, ulcers.  Neurologic: No gross focal neurologic deficit. Weakness Psychiatric: Normal mood and affect.   Labs on Admission: I have personally reviewed following labs and imaging studies  CBC: Recent Labs  Lab 04/04/20 0805  WBC 13.7*  HGB 7.9*  HCT 22.2*  MCV 101.8*  PLT 124*   Basic Metabolic Panel: Recent Labs  Lab 04/04/20 0805  NA 119*  K 2.6*  CL 68*  CO2 31  GLUCOSE 99  BUN 50*  CREATININE 1.55*  CALCIUM 8.4*   GFR: Estimated Creatinine Clearance: 40.6 mL/min (A) (by C-G formula based on SCr of 1.55 mg/dL (H)). Liver Function Tests: Recent Labs  Lab 04/04/20 0805  AST 174*  ALT 50*  ALKPHOS 146*  BILITOT 20.1*  PROT 5.7*  ALBUMIN 2.5*   Recent Labs  Lab 04/04/20 0805  LIPASE 49   Recent Labs  Lab 04/04/20 1054  AMMONIA RESULTS UNAVAILABLE DUE TO INTERFERING SUBSTANCE   Coagulation Profile: Recent Labs  Lab 04/04/20 1036  INR 1.3*   Cardiac Enzymes: No results for input(s): CKTOTAL, CKMB, CKMBINDEX, TROPONINI in the last 168 hours. BNP (last 3 results) No results for input(s): PROBNP in the last 8760 hours. HbA1C: No results for input(s): HGBA1C in the last 72 hours. CBG: No results for input(s): GLUCAP in the last 168 hours. Lipid Profile: No results for input(s): CHOL, HDL, LDLCALC, TRIG, CHOLHDL, LDLDIRECT in the last 72 hours. Thyroid Function Tests: No results for input(s): TSH, T4TOTAL, FREET4, T3FREE, THYROIDAB in the last 72 hours. Anemia Panel: No results for input(s): VITAMINB12, FOLATE, FERRITIN, TIBC, IRON, RETICCTPCT in the last 72 hours. Urine analysis:    Component Value Date/Time   COLORURINE AMBER (A) 01/25/2020 0232   APPEARANCEUR HAZY (A) 01/25/2020 0232   LABSPEC 1.036 (H) 01/25/2020 0232   PHURINE 6.0  01/25/2020 0232   GLUCOSEU NEGATIVE 01/25/2020 0232   HGBUR LARGE (A) 01/25/2020 0232   BILIRUBINUR NEGATIVE 01/25/2020 0232   KETONESUR 5 (A) 01/25/2020 0232   PROTEINUR NEGATIVE 01/25/2020 0232   NITRITE NEGATIVE 01/25/2020 0232   LEUKOCYTESUR NEGATIVE 01/25/2020 0232    Radiological Exams on Admission: DG Chest Portable 1 View  Result Date: 04/04/2020 CLINICAL DATA:  Weakness EXAM: PORTABLE CHEST 1 VIEW COMPARISON:  01/24/2020 FINDINGS: Cardiac shadow is within normal limits. The lungs are well aerated bilaterally. Minimal platelike atelectasis in the right base is noted. No focal infiltrate or effusion is seen. IMPRESSION: Mild right basilar atelectasis. Electronically Signed   By: 01/26/2020 M.D.   On: 04/04/2020 11:33   06/04/2020 ABDOMEN LIMITED RUQ  Result Date: 04/04/2020 CLINICAL DATA:  Jaundice for 2 days EXAM: ULTRASOUND ABDOMEN LIMITED RIGHT UPPER QUADRANT COMPARISON:  None. FINDINGS: Gallbladder: Well distended with gallbladder sludge.  No wall thickening or pericholecystic fluid is noted. Common bile duct: Diameter: 3.6 mm. Liver: Increase in echogenicity consistent with fatty infiltration stable from the prior CT examination. Portal vein is patent on color Doppler imaging with normal direction of blood flow towards the liver. Other: None. IMPRESSION: Mild ascites. Fatty liver. Gallbladder sludge. Electronically Signed   By: Alcide Clever M.D.   On: 04/04/2020 12:00    EKG: Independently reviewed.  Sinus tachycardia  Assessment/Plan Principal Problem:   Acute alcoholic liver disease Active Problems:   Hypokalemia   Hyponatremia   Nicotine dependence   AKI (acute kidney injury) (HCC)   Anemia    Acute alcoholic liver disease Patient with a history of alcohol abuse who presents to the ER for evaluation of weakness and jaundice. Patient noted to have elevated total bilirubin level of 20 and transaminitis Patient has a MELD score of 32 Discussed with gastroenterologist who  recommends to start patient on prednisone 40 mg daily. Will start patient on IV Rocephin for possible SBP.  Paracentesis not done because patient did not have enough ascitic fluids for paracentesis   Acute kidney injury Most likely prerenal vs secondary to hepato renal syndrome Patient has had poor oral intake with associated nausea and vomiting At baseline serum creatinine is 0.3 but it is 1.55  on admission with an elevated BUN of 50 IV fluid hydration Repeat renal parameters in a.m.   Hyponatremia Most likely hemodilutional Obtain urine sodium level, serum and urine osmolality Repeat sodium levels in a.m.   Hypokalemia Supplement potassium Obtain magnesium levels   History of alcohol abuse Patient has an increased risk of developing symptoms related to alcohol withdrawal We will place patient on lorazepam administered for CIWA score of 8 or greater    Anemia Patient noted to have a drop in her hemoglobin from 12.7g/dl to 0.1V/CB Concern for possible blood loss from a GI source Patient states that she has had dark stools Obtain stool Hemoccult Monitor H&H and transfuse as needed    Nicotine dependence Smoking cessation has been discussed with patient in detail We will place patient on nicotine transdermal patch 21 mg daily  DVT prophylaxis: SCD Code Status: Full code Family Communication: Greater than 50% of time was spent discussing plan of care with patient at the bedside. All questions and concerns have been addressed. She verbalized understanding and agrees with the plan Disposition Plan: Back to previous home environment Consults called: GI/ Nephrology    Amariyana Heacox MD [facility]     04/04/2020, 1:39 PM

## 2020-04-04 NOTE — ED Triage Notes (Signed)
Pt states she has not felt well for the past 2 weeks, no appetite, N/V .. states in the past 2 days having abd distention with jaundice skin color and eyes, denies hx of liver disease.

## 2020-04-04 NOTE — ED Provider Notes (Signed)
[facility] Emergency Department Provider Note    First MD Initiated Contact with Patient 04/04/20 1029     (approximate)  I have reviewed the triage vital signs and the nursing notes.   HISTORY  Chief Complaint Weakness    HPI Oveta Idris is a 44 y.o. female presents to the ER due to concern for "chemical imbalance.  "Patient has been having increasing weakness for the past several weeks.  States he does have a history of alcohol abuse was drinking roughly a bottle of vodka daily but states that she quit drinking roughly 2 weeks ago.  Denies any history of liver disease.  States her husband noticed that she was turning yellow yesterday so she came to the ER today.  She is had decreased appetite.  Denies any chest pain.  No abdominal pain.  Is not had any melena or hematochezia.    History reviewed. No pertinent past medical history. No family history on file. Past Surgical History:  Procedure Laterality Date  . CARPAL TUNNEL RELEASE    . CESAREAN SECTION    . HEMORRHOID SURGERY N/A 12/25/2019   Procedure: HEMORRHOIDECTOMY;  Surgeon: Sung Amabile, DO;  Location: [facility] ORS;  Service: General;  Laterality: N/A;  . INCISION AND DRAINAGE PERIRECTAL ABSCESS N/A 12/25/2019   Procedure: IRRIGATION AND DEBRIDEMENT PERIRECTAL ABSCESS;  Surgeon: Sung Amabile, DO;  Location: [facility] ORS;  Service: General;  Laterality: N/A;  . TUBAL LIGATION     Patient Active Problem List   Diagnosis Date Noted  . Acute alcoholic liver disease 04/04/2020  . AKI (acute kidney injury) (HCC) 04/04/2020  . Anemia 04/04/2020  . SIRS due to non-infectious process without acute organ dysfunction (HCC) 01/26/2020  . Claudication of both lower extremities (HCC) 01/26/2020  . Malnutrition of moderate degree 01/25/2020  . Hypomagnesemia 01/24/2020  . Nicotine dependence 01/24/2020  . Lung nodule < 6cm on CT 01/24/2020  . Folate deficiency 12/24/2019  . Perianal abscess 12/22/2019  .  Hypokalemia 12/22/2019  . Hyponatremia 12/22/2019  . Macrocytosis 12/22/2019  . Elevated AST (SGOT) 12/22/2019  . Colon wall thickening 12/22/2019      Prior to Admission medications   Medication Sig Start Date End Date Taking? Authorizing Provider  loratadine (CLARITIN) 10 MG tablet Take 1 tablet (10 mg total) by mouth daily. 12/26/19  Yes Esaw Grandchild A, DO  nicotine (NICODERM CQ - DOSED IN MG/24 HOURS) 21 mg/24hr patch Place 1 patch (21 mg total) onto the skin daily. 01/27/20  Yes Dhungel, Theda Belfast, MD    Allergies Acetaminophen    Social History Social History   Tobacco Use  . Smoking status: Current Every Day Smoker    Types: Cigarettes  . Smokeless tobacco: Never Used  Substance Use Topics  . Alcohol use: Yes    Comment: dailhy drinker, 2-3 beers a day, was a former liquor drinker  . Drug use: Not Currently    Review of Systems Patient denies headaches, rhinorrhea, blurry vision, numbness, shortness of breath, chest pain, edema, cough, abdominal pain, nausea, vomiting, diarrhea, dysuria, fevers, rashes or hallucinations unless otherwise stated above in HPI. ____________________________________________   PHYSICAL EXAM:  VITAL SIGNS: Vitals:   04/04/20 1104 04/04/20 1201  BP: 102/64 100/64  Pulse: 97 100  Resp: 16 (!) 24  Temp:    SpO2: 99% 99%    Constitutional: Alert and oriented. jaundiced Eyes: Conjunctivae are jaundiced Head: Atraumatic. Nose: No congestion/rhinnorhea. Mouth/Throat: Mucous membranes are moist.   Neck: No stridor. Painless ROM.  Cardiovascular: Normal rate, regular rhythm. Grossly normal heart sounds.  Good peripheral circulation. Respiratory: Normal respiratory effort.  No retractions. Lungs CTAB. Gastrointestinal: Soft and nontender. Mild distention No abdominal bruits. No CVA tenderness. Genitourinary:  Musculoskeletal: No lower extremity tenderness nor edema.  No joint effusions. Neurologic:  Normal speech and language. No  gross focal neurologic deficits are appreciated. No facial droop Skin:  Skin is warm, dry and intact. No rash noted. Psychiatric: Mood and affect are normal. Speech and behavior are normal.  ____________________________________________   LABS (all labs ordered are listed, but only abnormal results are displayed)  Results for orders placed or performed during the hospital encounter of 04/04/20 (from the past 24 hour(s))  Basic metabolic panel     Status: Abnormal   Collection Time: 04/04/20  8:05 AM  Result Value Ref Range   Sodium 119 (LL) 135 - 145 mmol/L   Potassium 2.6 (LL) 3.5 - 5.1 mmol/L   Chloride 68 (L) 98 - 111 mmol/L   CO2 31 22 - 32 mmol/L   Glucose, Bld 99 70 - 99 mg/dL   BUN 50 (H) 6 - 20 mg/dL   Creatinine, Ser 1.611.55 (H) 0.44 - 1.00 mg/dL   Calcium 8.4 (L) 8.9 - 10.3 mg/dL   GFR calc non Af Amer 41 (L) >60 mL/min   GFR calc Af Amer 47 (L) >60 mL/min   Anion gap 20 (H) 5 - 15  CBC     Status: Abnormal   Collection Time: 04/04/20  8:05 AM  Result Value Ref Range   WBC 13.7 (H) 4.0 - 10.5 K/uL   RBC 2.18 (L) 3.87 - 5.11 MIL/uL   Hemoglobin 7.9 (L) 12.0 - 15.0 g/dL   HCT 09.622.2 (L) 36 - 46 %   MCV 101.8 (H) 80.0 - 100.0 fL   MCH 36.2 (H) 26.0 - 34.0 pg   MCHC 35.6 30.0 - 36.0 g/dL   RDW 04.516.7 (H) 40.911.5 - 81.115.5 %   Platelets 124 (L) 150 - 400 K/uL   nRBC 0.2 0.0 - 0.2 %  Hepatic function panel     Status: Abnormal   Collection Time: 04/04/20  8:05 AM  Result Value Ref Range   Total Protein 5.7 (L) 6.5 - 8.1 g/dL   Albumin 2.5 (L) 3.5 - 5.0 g/dL   AST 914174 (H) 15 - 41 U/L   ALT 50 (H) 0 - 44 U/L   Alkaline Phosphatase 146 (H) 38 - 126 U/L   Total Bilirubin 20.1 (HH) 0.3 - 1.2 mg/dL   Bilirubin, Direct 78.211.8 (H) 0.0 - 0.2 mg/dL   Indirect Bilirubin 8.3 (H) 0.3 - 0.9 mg/dL  Lipase, blood     Status: None   Collection Time: 04/04/20  8:05 AM  Result Value Ref Range   Lipase 49 11 - 51 U/L  Protime-INR     Status: Abnormal   Collection Time: 04/04/20 10:36 AM  Result  Value Ref Range   Prothrombin Time 16.1 (H) 11.4 - 15.2 seconds   INR 1.3 (H) 0.8 - 1.2  Acetaminophen level     Status: Abnormal   Collection Time: 04/04/20 10:36 AM  Result Value Ref Range   Acetaminophen (Tylenol), Serum <10 (L) 10 - 30 ug/mL  Ammonia     Status: None   Collection Time: 04/04/20 10:54 AM  Result Value Ref Range   Ammonia RESULTS UNAVAILABLE DUE TO INTERFERING SUBSTANCE 9 - 35 umol/L  SARS Coronavirus 2 by RT PCR (hospital order, performed in [facility] hospital lab) Nasopharyngeal Nasopharyngeal Swab     Status: None   Collection Time: 04/04/20 12:11 PM   Specimen: Nasopharyngeal Swab  Result Value Ref Range   SARS Coronavirus 2 NEGATIVE NEGATIVE   ____________________________________________  EKG My review and personal interpretation at Time: 8:08   Indication: weakness  Rate: 100  Rhythm: sinus Axis: normal Other: no stemi, nonspecific st abn, ____________________________________________  RADIOLOGY  I personally reviewed all radiographic images ordered to evaluate for the above acute complaints and reviewed radiology reports and findings.  These findings were personally discussed with the patient.  Please see medical record for radiology report.  ____________________________________________   PROCEDURES  Procedure(s) performed:  .Critical Care Performed by: Willy Eddy, MD Authorized by: Willy Eddy, MD   Critical care provider statement:    Critical care time (minutes):  40   Critical care time was exclusive of:  Separately billable procedures and treating other patients   Critical care was necessary to treat or prevent imminent or life-threatening deterioration of the following conditions:  Hepatic failure   Critical care was time spent personally by me on the following activities:  Development of treatment plan with patient or surrogate, discussions with consultants, evaluation of patient's response to treatment, examination of patient,  obtaining history from patient or surrogate, ordering and performing treatments and interventions, ordering and review of laboratory studies, ordering and review of radiographic studies, pulse oximetry, re-evaluation of patient's condition and review of old charts      Critical Care performed: yes ____________________________________________   INITIAL IMPRESSION / ASSESSMENT AND PLAN / ED COURSE  Pertinent labs & imaging results that were available during my care of the patient were reviewed by me and considered in my medical decision making (see chart for details).   DDX: Cirrhosis, liver failure, electrolyte abnormality, multisystem organ failure, dehydration, SBP, GI bleed  Cailan Antonucci is a 44 y.o. who presents to the ED with symptoms as described above.  Patient very jaundiced appearing but protecting her airway.  Does not appear significantly encephalopathic.  Blood work with triage does show evidence of hyperkalemia as well as hyponatremia.  Does have new anemia as well with leukocytosis but not having any fever or focal pain.  Do suspect cirrhosis or new onset liver failure as the patient admits to significant alcohol ingestion.  Will add on blood work.  Will provide IV fluids.  Will order ultrasound as well as chest x-ray.  Anticipate patient will require hospitalization.  Clinical Course as of Apr 04 1342  Thu Apr 04, 2020  1213 INR(!): 1.3 [PR]  1219 Presentation is concerning for acute liver failure likely secondary to alcoholic hepatitis.  Did discuss case in consultation with Dr. Daleen Squibb of GI who does recommend a diagnostic paracentesis as well as IV Protonix.  Would recommend IV Rocephin after diagnostic paracentesis completed.   [PR]    Clinical Course User Index [PR] Willy Eddy, MD    The patient was evaluated in Emergency Department today for the symptoms described in the history of present illness. He/she was evaluated in the context of the global COVID-19  pandemic, which necessitated consideration that the patient might be at risk for infection with the SARS-CoV-2 virus that causes COVID-19. Institutional protocols and algorithms that pertain to the evaluation of patients at risk for COVID-19 are in a state of rapid change based on information released by regulatory bodies including the CDC and federal and state organizations. These policies and algorithms were followed during the patient's care  in the ED.  As part of my medical decision making, I reviewed the following data within the electronic MEDICAL RECORD NUMBER Nursing notes reviewed and incorporated, Labs reviewed, notes from prior ED visits and [place] Controlled Substance Database   ____________________________________________   FINAL CLINICAL IMPRESSION(S) / ED DIAGNOSES  Final diagnoses:  Jaundice  Acute liver failure without hepatic coma  Hyponatremia  Ascites      NEW MEDICATIONS STARTED DURING THIS VISIT:  New Prescriptions   No medications on file     Note:  This document was prepared using Dragon voice recognition software and may include unintentional dictation errors.    Willy Eddy, MD 04/04/20 1344

## 2020-04-04 NOTE — Consult Note (Signed)
Midge Minium, MD [facility]  [address] [place], [place] [address] Phone: [phone] Fax : [phone]  Consultation  Referring Provider:     Dr. Roxan Hockey Primary Care Physician:  Patient, No Pcp Per Primary Gastroenterologist: Gentry Fitz         Reason for Consultation:     Acute alcoholic hepatitis  Date of Admission:  04/04/2020 Date of Consultation:  04/04/2020         HPI:   Jill Reyes is a 44 y.o. female who comes in today with a history of significant alcohol abuse who reports that she quit drinking approximately 2 weeks ago.  She reports that she did not have a taste for it anymore.  The patient states that she is not been feeling well and reports that she was drinking a few bottles of vodka daily.  The patient also was noted to have significant jaundice on admission with acute kidney injury and anemia.  The patient denies any black stools or bloody stools.  The patient had imaging that showed mild ascites and the patient has some abdominal pain therefore she was sent for a ultrasound-guided paracentesis.  After repeat evaluation there did not seem to be enough ascites to sample.  The patient was started on antibiotics in the ER due to her history of cirrhosis and anemia with her hemoglobin of 7.6.  The patient's previous hemoglobin in May of this year was 12.6.  The patient is also reporting that she is dealing with her husband's diagnosis of metastatic malignant cell melanoma.   History reviewed. No pertinent past medical history.  Past Surgical History:  Procedure Laterality Date  . CARPAL TUNNEL RELEASE    . CESAREAN SECTION    . HEMORRHOID SURGERY N/A 12/25/2019   Procedure: HEMORRHOIDECTOMY;  Surgeon: Sung Amabile, DO;  Location: [facility] ORS;  Service: General;  Laterality: N/A;  . INCISION AND DRAINAGE PERIRECTAL ABSCESS N/A 12/25/2019   Procedure: IRRIGATION AND DEBRIDEMENT PERIRECTAL ABSCESS;  Surgeon: Sung Amabile, DO;  Location: [facility] ORS;  Service: General;   Laterality: N/A;  . TUBAL LIGATION      Prior to Admission medications   Medication Sig Start Date End Date Taking? Authorizing Provider  loratadine (CLARITIN) 10 MG tablet Take 1 tablet (10 mg total) by mouth daily. 12/26/19  Yes Esaw Grandchild A, DO  nicotine (NICODERM CQ - DOSED IN MG/24 HOURS) 21 mg/24hr patch Place 1 patch (21 mg total) onto the skin daily. 01/27/20  Yes Dhungel, Theda Belfast, MD    No family history on file.   Social History   Tobacco Use  . Smoking status: Current Every Day Smoker    Types: Cigarettes  . Smokeless tobacco: Never Used  Substance Use Topics  . Alcohol use: Yes    Comment: dailhy drinker, 2-3 beers a day, was a former liquor drinker  . Drug use: Not Currently    Allergies as of 04/04/2020 - Review Complete 04/04/2020  Allergen Reaction Noted  . Acetaminophen Shortness Of Breath 04/04/2020    Review of Systems:    All systems reviewed and negative except where noted in HPI.   Physical Exam:  Vital signs in last 24 hours: Temp:  [97.9 F (36.6 C)] 97.9 F (36.6 C) (08/05 0803) Pulse Rate:  [94-103] 97 (08/05 1700) Resp:  [9-24] 20 (08/05 1700) BP: (84-110)/(47-64) 98/50 (08/05 1700) SpO2:  [88 %-99 %] 95 % (08/05 1700) Weight:  [54.4 kg] 54.4 kg (08/05 0804)   General:   Pleasant, cooperative in  NAD Head:  Normocephalic and atraumatic. Eyes:   Positive icterus.   Conjunctiva yellow. PERRLA. Ears:  Normal auditory acuity. Neck:  Supple; no masses or thyroidomegaly Lungs: Respirations even and unlabored. Lungs clear to auscultation bilaterally.   No wheezes, crackles, or rhonchi.  Heart:  Regular rate and rhythm;  Without murmur, clicks, rubs or gallops Abdomen:  Soft, nondistended, diffusely tender. Normal bowel sounds. No appreciable masses or hepatomegaly.  No rebound or guarding.  Rectal:  Not performed. Msk:  Symmetrical without gross deformities.    Extremities:  Without edema, cyanosis or clubbing. Neurologic:  Alert and  oriented x3;  grossly normal neurologically. Skin:  Intact without significant lesions or rashes.  Positive jaundice Cervical Nodes:  No significant cervical adenopathy. Psych:  Alert and cooperative. Normal affect.  LAB RESULTS: Recent Labs    04/04/20 0805 04/04/20 1439  WBC 13.7* 12.3*  HGB 7.9* 7.6*  HCT 22.2* 20.5*  PLT 124* 118*   BMET Recent Labs    04/04/20 0805  NA 119*  K 2.6*  CL 68*  CO2 31  GLUCOSE 99  BUN 50*  CREATININE 1.55*  CALCIUM 8.4*   LFT Recent Labs    04/04/20 0805  PROT 5.7*  ALBUMIN 2.5*  AST 174*  ALT 50*  ALKPHOS 146*  BILITOT 20.1*  BILIDIR 11.8*  IBILI 8.3*   PT/INR Recent Labs    04/04/20 1036  LABPROT 16.1*  INR 1.3*    STUDIES: US Abdomen Limited  Result Date: 04/04/2020 CLINICAL DATA:  History of alcoholic liver disease and ascites. Abdominal distension and jaundice. EXAM: LIMITED ABDOMEN ULTRASOUND FOR ASCITES TECHNIQUE: Limited ultrasound survey for ascites was performed in all four abdominal quadrants. COMPARISON:  Right upper quadrant abdominal ultrasound earlier today. FINDINGS: Survey of the peritoneal cavity demonstrates only a minimal amount of scattered ascites. There is not enough fluid to allow for paracentesis. IMPRESSION: Minimal ascites in the peritoneal cavity. Electronically Signed   By: Irish Lack M.D.   On: 04/04/2020 15:28   DG Chest Portable 1 View  Result Date: 04/04/2020 CLINICAL DATA:  Weakness EXAM: PORTABLE CHEST 1 VIEW COMPARISON:  01/24/2020 FINDINGS: Cardiac shadow is within normal limits. The lungs are well aerated bilaterally. Minimal platelike atelectasis in the right base is noted. No focal infiltrate or effusion is seen. IMPRESSION: Mild right basilar atelectasis. Electronically Signed   By: Alcide Clever M.D.   On: 04/04/2020 11:33   US ABDOMEN LIMITED RUQ  Result Date: 04/04/2020 CLINICAL DATA:  Jaundice for 2 days EXAM: ULTRASOUND ABDOMEN LIMITED RIGHT UPPER QUADRANT COMPARISON:  None.  FINDINGS: Gallbladder: Well distended with gallbladder sludge. No wall thickening or pericholecystic fluid is noted. Common bile duct: Diameter: 3.6 mm. Liver: Increase in echogenicity consistent with fatty infiltration stable from the prior CT examination. Portal vein is patent on color Doppler imaging with normal direction of blood flow towards the liver. Other: None. IMPRESSION: Mild ascites. Fatty liver. Gallbladder sludge. Electronically Signed   By: Alcide Clever M.D.   On: 04/04/2020 12:00      Impression / Plan:   Assessment: Principal Problem:   Acute alcoholic liver disease Active Problems:   Hypokalemia   Hyponatremia   Nicotine dependence   AKI (acute kidney injury) (HCC)   Anemia   Jill Reyes is a 44 y.o. y/o female with a significant history of alcohol abuse with a meld score of 32 translating to a 52% mortality rate and the next 3 months.  She had her discriminant function calculated  at came down to 38 therefore she will be started on prednisone 40 mg daily.  Plan: The patient will continue supportive care with prednisone for her elevated discriminant function and has been informed of her 51-month mortality.  She has been told that her absence from alcohol is of paramount importance.  I would suggest a nephrology consult due to the risk of this being hepatorenal syndrome although this also may be prerenal azotemia.  I would continue to monitor her hemoglobin.  I agree with the patient being started on folic acid and thiamine.  I also continue the PPI.  If there appears to be any sign of GI bleeding then a source of the GI bleeding will be investigated with luminal evaluation.  Thank you for involving me in the care of this patient.      LOS: 0 days   Midge Minium, MD  04/04/2020, 5:42 PM Pager [phone] 7am-5pm  Check AMION for 5pm -7am coverage and on weekends   Note: This dictation was prepared with Dragon dictation along with smaller phrase technology. Any  transcriptional errors that result from this process are unintentional.

## 2020-04-04 NOTE — Consult Note (Signed)
CHIEF COMPLAINT:   Chief Complaint  Patient presents with  . Weakness   HPI 44 year old female with no significant medical history who had surgery of perirectal abscess and hemorrhoids about 8 weeks ago presented to the ED with dizziness not feeling well, abd pain and swelling  Patient has extensive ETOH abuse stop liquor abuse 1 month ago Stopped BEER 1 week ago  She reports having body aches with poor p.o. intake. Also complaining of pain in her ankles and calves off-and-on bilaterally.   She has NO resp issues or Cardiac issues at this time   Patient with jaudnice, yellow eyes for 1 month, spider angiomas for 1 month Patient showing signs of end stage lilver failure and disease Husband at bedside.  Patient is alert and awake, follows commands NAD, resting comfortably VS stable   DB 11 TB 20 ALB 2.5  [place] ABD  IMPRESSION: Mild ascites. Fatty liver. Gallbladder sludge.  CXR Cardiac shadow is within normal limits. The lungs are well aerated bilaterally. Minimal platelike atelectasis in the right base is noted. No focal infiltrate or effusion is seen.     Active Ambulatory Problems    Diagnosis Date Noted  . Perianal abscess 12/22/2019  . Hypokalemia 12/22/2019  . Hyponatremia 12/22/2019  . Macrocytosis 12/22/2019  . Elevated AST (SGOT) 12/22/2019  . Colon wall thickening 12/22/2019  . Folate deficiency 12/24/2019  . Hypomagnesemia 01/24/2020  . Nicotine dependence 01/24/2020  . Lung nodule < 6cm on CT 01/24/2020  . Malnutrition of moderate degree 01/25/2020  . SIRS due to non-infectious process without acute organ dysfunction (HCC) 01/26/2020  . Claudication of both lower extremities (HCC) 01/26/2020   Resolved Ambulatory Problems    Diagnosis Date Noted  . No Resolved Ambulatory Problems   No Additional Past Medical History   Past Surgical History:  Procedure Laterality Date  . CARPAL TUNNEL RELEASE    . CESAREAN SECTION    . HEMORRHOID  SURGERY N/A 12/25/2019   Procedure: HEMORRHOIDECTOMY;  Surgeon: Sung Amabile, DO;  Location: [facility] ORS;  Service: General;  Laterality: N/A;  . INCISION AND DRAINAGE PERIRECTAL ABSCESS N/A 12/25/2019   Procedure: IRRIGATION AND DEBRIDEMENT PERIRECTAL ABSCESS;  Surgeon: Sung Amabile, DO;  Location: [facility] ORS;  Service: General;  Laterality: N/A;  . TUBAL LIGATION     No family history on file.   Social History   Socioeconomic History  . Marital status: Married    Spouse name: Not on file  . Number of children: Not on file  . Years of education: Not on file  . Highest education level: Not on file  Occupational History  . Not on file  Tobacco Use  . Smoking status: Current Every Day Smoker    Types: Cigarettes  . Smokeless tobacco: Never Used  Substance and Sexual Activity  . Alcohol use: Yes    Comment: dailhy drinker, 2-3 beers a day, was a former liquor drinker  . Drug use: Not Currently  . Sexual activity: Not on file  Other Topics Concern  . Not on file  Social History Narrative  . Not on file   Social Determinants of Health   Financial Resource Strain:   . Difficulty of Paying Living Expenses:   Food Insecurity:   . Worried About Programme researcher, broadcasting/film/video in the Last Year:   . Barista in the Last Year:   Transportation Needs:   . Freight forwarder (Medical):   Marland Kitchen Lack of Transportation (Non-Medical):   Physical  Activity:   . Days of Exercise per Week:   . Minutes of Exercise per Session:   Stress:   . Feeling of Stress :   Social Connections:   . Frequency of Communication with Friends and Family:   . Frequency of Social Gatherings with Friends and Family:   . Attends Religious Services:   . Active Member of Clubs or Organizations:   . Attends Banker Meetings:   Marland Kitchen Marital Status:   Intimate Partner Violence:   . Fear of Current or Ex-Partner:   . Emotionally Abused:   Marland Kitchen Physically Abused:   . Sexually Abused:      Review of  Systems:  Gen:  fatigue HEENT: Denies blurred vision, double vision, ear pain, eye pain, hearing loss, nose bleeds, sore throat Cardiac:  No dizziness, chest pain or heaviness, chest tightness,edema, No JVD Resp:   No cough, -sputum production, -shortness of breath,-wheezing, -hemoptysis,  Gi: +abd pain, bloating Gu:  Denies bladder incontinence, burning urine Ext:   Denies Joint pain, stiffness or swelling Skin: Denies  skin rash, easy bruising or bleeding or hives Endoc:  Denies polyuria, polydipsia , polyphagia or weight change Psych:   Denies depression, insomnia or hallucinations  Other:  All other systems negative   Objective   Examination:  General exam: Appears calm and comfortable  Respiratory system: Clear to auscultation. Respiratory effort normal. HEENT: [place]/AT, PERRLA, no thrush, no stridor. Cardiovascular system: S1 & S2 heard, RRR. No JVD, murmurs, rubs, gallops or clicks. No pedal edema. Gastrointestinal system: Abdomen is nondistended, soft and nontender. No organomegaly or masses felt. Normal bowel sounds heard. Central nervous system: Alert and oriented. No focal neurological deficits. Extremities: Symmetric 5 x 5 power. Skin: No rashes, lesions or ulcers Psychiatry: Judgement and insight appear normal. Mood & affect appropriate.   VITALS:  height is 5\' 8"  (1.727 m) and weight is 54.4 kg. Her oral temperature is 97.9 F (36.6 C). Her blood pressure is 100/64 and her pulse is 100. Her respiration is 24 (abnormal) and oxygen saturation is 99%.   I personally reviewed Labs under Results section.  Radiology Reports DG Chest Portable 1 View  Result Date: 04/04/2020 CLINICAL DATA:  Weakness EXAM: PORTABLE CHEST 1 VIEW COMPARISON:  01/24/2020 FINDINGS: Cardiac shadow is within normal limits. The lungs are well aerated bilaterally. Minimal platelike atelectasis in the right base is noted. No focal infiltrate or effusion is seen. IMPRESSION: Mild right basilar  atelectasis. Electronically Signed   By: 01/26/2020 M.D.   On: 04/04/2020 11:33   06/04/2020 ABDOMEN LIMITED RUQ  Result Date: 04/04/2020 CLINICAL DATA:  Jaundice for 2 days EXAM: ULTRASOUND ABDOMEN LIMITED RIGHT UPPER QUADRANT COMPARISON:  None. FINDINGS: Gallbladder: Well distended with gallbladder sludge. No wall thickening or pericholecystic fluid is noted. Common bile duct: Diameter: 3.6 mm. Liver: Increase in echogenicity consistent with fatty infiltration stable from the prior CT examination. Portal vein is patent on color Doppler imaging with normal direction of blood flow towards the liver. Other: None. IMPRESSION: Mild ascites. Fatty liver. Gallbladder sludge. Electronically Signed   By: 06/04/2020 M.D.   On: 04/04/2020 12:00       Assessment/Plan:  Acute liver failure due to ETOH abuse Alert and awake, VS stable Patient does not meet SD/ICU admission  Recommend GI evaluation Recommend TRH admission    Overall prognosis is poor     06/04/2020, M.D.  [facility]  Medical Director [facility] Medical  Director [facility] Cardio-Pulmonary Department

## 2020-04-05 DIAGNOSIS — E871 Hypo-osmolality and hyponatremia: Secondary | ICD-10-CM

## 2020-04-05 DIAGNOSIS — E876 Hypokalemia: Secondary | ICD-10-CM

## 2020-04-05 LAB — VITAMIN B12: Vitamin B-12: 2792 pg/mL — ABNORMAL HIGH (ref 180–914)

## 2020-04-05 LAB — BASIC METABOLIC PANEL
Anion gap: 14 (ref 5–15)
Anion gap: 14 (ref 5–15)
BUN: 58 mg/dL — ABNORMAL HIGH (ref 6–20)
BUN: 56 mg/dL — ABNORMAL HIGH (ref 6–20)
CO2: 29 mmol/L (ref 22–32)
CO2: 29 mmol/L (ref 22–32)
Calcium: 7.4 mg/dL — ABNORMAL LOW (ref 8.9–10.3)
Calcium: 7.6 mg/dL — ABNORMAL LOW (ref 8.9–10.3)
Chloride: 80 mmol/L — ABNORMAL LOW (ref 98–111)
Chloride: 84 mmol/L — ABNORMAL LOW (ref 98–111)
Creatinine, Ser: 1.51 mg/dL — ABNORMAL HIGH (ref 0.44–1.00)
Creatinine, Ser: 1.32 mg/dL — ABNORMAL HIGH (ref 0.44–1.00)
GFR calc Af Amer: 49 mL/min — ABNORMAL LOW (ref >60)
GFR calc Af Amer: 58 mL/min — ABNORMAL LOW (ref >60)
GFR calc non Af Amer: 42 mL/min — ABNORMAL LOW (ref >60)
GFR calc non Af Amer: 50 mL/min — ABNORMAL LOW (ref >60)
Glucose, Bld: 121 mg/dL — ABNORMAL HIGH (ref 70–99)
Glucose, Bld: 133 mg/dL — ABNORMAL HIGH (ref 70–99)
Potassium: 3.7 mmol/L (ref 3.5–5.1)
Potassium: 4 mmol/L (ref 3.5–5.1)
Sodium: 123 mmol/L — ABNORMAL LOW (ref 135–145)
Sodium: 127 mmol/L — ABNORMAL LOW (ref 135–145)

## 2020-04-05 LAB — CBC
HCT: 18.9 % — ABNORMAL LOW (ref 36.0–46.0)
Hemoglobin: 7 g/dL — ABNORMAL LOW (ref 12.0–15.0)
MCH: 36.8 pg — ABNORMAL HIGH (ref 26.0–34.0)
MCHC: 37 g/dL — ABNORMAL HIGH (ref 30.0–36.0)
MCV: 99.5 fL (ref 80.0–100.0)
Platelets: 124 10*3/uL — ABNORMAL LOW (ref 150–400)
RBC: 1.9 MIL/uL — ABNORMAL LOW (ref 3.87–5.11)
RDW: 17.3 % — ABNORMAL HIGH (ref 11.5–15.5)
WBC: 11.4 10*3/uL — ABNORMAL HIGH (ref 4.0–10.5)
nRBC: 0.2 % (ref 0.0–0.2)

## 2020-04-05 LAB — URINALYSIS, COMPLETE (UACMP) WITH MICROSCOPIC
Glucose, UA: NEGATIVE mg/dL
Hgb urine dipstick: NEGATIVE
Ketones, ur: NEGATIVE mg/dL
Nitrite: NEGATIVE
Protein, ur: NEGATIVE mg/dL
Specific Gravity, Urine: 1.016 (ref 1.005–1.030)
pH: 5 (ref 5.0–8.0)

## 2020-04-05 LAB — HEPATITIS PANEL, ACUTE
HCV Ab: NONREACTIVE
Hep A IgM: NONREACTIVE
Hep B C IgM: NONREACTIVE
Hepatitis B Surface Ag: NONREACTIVE

## 2020-04-05 LAB — PREPARE RBC (CROSSMATCH)

## 2020-04-05 LAB — HEMOGLOBIN AND HEMATOCRIT, BLOOD
HCT: 24.5 % — ABNORMAL LOW (ref 36.0–46.0)
Hemoglobin: 8.6 g/dL — ABNORMAL LOW (ref 12.0–15.0)

## 2020-04-05 LAB — ABO/RH: ABO/RH(D): A POS

## 2020-04-05 LAB — FOLATE: Folate: 78 ng/mL (ref >5.9)

## 2020-04-05 MED ORDER — ENSURE ENLIVE PO LIQD
237.0000 mL | Freq: Three times a day (TID) | ORAL | Status: DC
  Administered 2020-04-05 – 2020-04-09 (×10): 237 mL via ORAL

## 2020-04-05 MED ORDER — SODIUM CHLORIDE 0.9% IV SOLUTION: Freq: Once | INTRAVENOUS | Status: AC

## 2020-04-05 MED ORDER — SODIUM CHLORIDE 0.9 % IV SOLN: Freq: Once | INTRAVENOUS | Status: AC

## 2020-04-05 MED ORDER — LACTULOSE 10 GM/15ML PO SOLN
20.0000 g | Freq: Three times a day (TID) | ORAL | Status: DC
  Administered 2020-04-05: 20 g via ORAL
  Filled 2020-04-05 (×2): qty 30

## 2020-04-05 NOTE — Progress Notes (Signed)
Progress Note    Drishti Pepperman  VEL:[id number] DOB: 25-Jan-1976  DOA: 04/04/2020 PCP: Patient, No Pcp Per      Brief Narrative:    Medical records reviewed and are as summarized below:  Jill Reyes is a 44 y.o. 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  Patient has been started on lactulose today.  Continue prednisone.  Follow-up with gastroenterologist.  Severe anemia: Hemoglobin was 12.6 on 01/25/2020.  Transfuse 1 unit of packed red blood cells.  Risk, benefits and alternatives to blood transfusion were discussed with her husband at the bedside.  Her husband reported history of dark stools.  Stool for occult blood is pending.  Hyponatremia: Slowly improving.  Continue IV fluids and monitor sodium level..  Hypokalemia: Potassium is better.  Continue to monitor.  Acute kidney injury: No significant improvement.  Continue IV fluids.  Abnormal urinalysis,?  Acute UTI: Continue IV Rocephin.  Obtain urine culture.  Thrombocytopenia: This is likely from liver disease  Alcohol use disorder: Continue thiamine, folic acid and Ativan as needed.   Body mass index is 17.18 kg/m.  (Underweight)/severe protein calorie malnutrition  Diet Order            Diet 2 gram sodium Room service appropriate? Yes; Fluid consistency: Thin  Diet effective now                       Medications:    Chlorhexidine Gluconate Cloth  6 each Topical Daily   feeding supplement (ENSURE ENLIVE)  237 mL Oral TID BM   folic acid  1 mg Oral Daily   lactulose  20 g Oral TID   LORazepam  0-4 mg Intravenous Q4H    Followed by   Melene Muller ON 04/06/2020] LORazepam  0-4 mg Intravenous Q8H   multivitamin with minerals  1 tablet Oral Daily   nicotine  21 mg Transdermal Daily   [START ON 04/08/2020] pantoprazole  40 mg Intravenous Q12H   predniSONE  40 mg Oral Q breakfast   sodium chloride flush  3 mL Intravenous Once   thiamine  100 mg Oral Daily   Or   thiamine  100 mg Intravenous Daily   Continuous Infusions:  cefTRIAXone (ROCEPHIN)  IV 2 g (04/05/20 1713)   pantoprozole (PROTONIX) infusion 8 mg/hr (04/05/20 1713)     Anti-infectives (From admission, onward)   Start     Dose/Rate Route Frequency Ordered Stop   04/04/20 1600  cefTRIAXone (ROCEPHIN) 2 g in sodium chloride 0.9 % 100 mL IVPB     Discontinue     2 g 200 mL/hr over 30 Minutes Intravenous Every 24 hours 04/04/20 1558               Family Communication/Anticipated D/C date and plan/Code Status   DVT prophylaxis: SCDs Start: 04/04/20 1350     Code Status: Full Code  Family Communication: Plan discussed with husband at the bedside Disposition Plan:    Status is: Inpatient  Remains inpatient appropriate because:IV treatments appropriate due to intensity of illness or inability to take PO   Dispo: The patient is from:  Home              Anticipated d/c is to: Home              Anticipated d/c date is: > 3 days              Patient currently is not medically stable to d/c.           Subjective:   She is confused unable to provide any history.  Her husband and Jill Reyes, Jill Reyes fundraiser were at the bedside.  Objective:    Vitals:   04/05/20 1403 04/05/20 1421 04/05/20 1526 04/05/20 1722  BP: 96/78 98/69 95/62  109/67  Pulse: (!) 114 (!) 110 100 (!) 109  Resp: 16 16 19 16   Temp: 98.3 F (36.8 C) 98 F (36.7 C) 97.7 F (36.5 C) 98.1 F (36.7 C)  TempSrc: Oral Oral    SpO2: 98% 100% 95% 94%  Weight:      Height:       No data found.   Intake/Output Summary (Last 24 hours) at 04/05/2020 1740 Last data filed  at 04/05/2020 1713 Gross per 24 hour  Intake 1549 ml  Output 100 ml  Net 1449 ml   Filed Weights   04/04/20 0804 04/04/20 2023 04/05/20 0502  Weight: 54.4 kg 51.3 kg 51.3 kg    Exam:  GEN: NAD SKIN: Jaundice, spider angioma on the chest EYES: EOMI, pale, icteric ENT: MMM CV: RRR PULM: CTA B ABD: soft, mildly disteded, NT, +BS CNS: Alert but confused, she doesn't follow commands, non focal EXT: No edema or tenderness   Data Reviewed:   I have personally reviewed following labs and imaging studies:  Labs: Labs show the following:   Basic Metabolic Panel: Recent Labs  Lab 04/04/20 0805 04/04/20 1439 04/05/20 0520  NA 119*  --  123*  K 2.6*  --  3.7  CL 68*  --  80*  CO2 31  --  29  GLUCOSE 99  --  121*  BUN 50*  --  58*  CREATININE 1.55*  --  1.51*  CALCIUM 8.4*  --  7.4*  MG  --  2.6*  --   PHOS  --  UNABLE TO REPORT DUE TO ICTERUS  --    GFR Estimated Creatinine Clearance: 39.3 mL/min (A) (by C-G formula based on SCr of 1.51 mg/dL (H)). Liver Function Tests: Recent Labs  Lab 04/04/20 0805  AST 174*  ALT 50*  ALKPHOS 146*  BILITOT 20.1*  PROT 5.7*  ALBUMIN 2.5*   Recent Labs  Lab 04/04/20 0805  LIPASE 49   Recent Labs  Lab 04/04/20 1054  AMMONIA RESULTS UNAVAILABLE DUE TO INTERFERING SUBSTANCE   Coagulation profile Recent Labs  Lab 04/04/20 1036  INR 1.3*    CBC: Recent Labs  Lab 04/04/20 0805 04/04/20 1439 04/05/20 0520  WBC 13.7* 12.3* 11.4*  HGB 7.9* 7.6* 7.0*  HCT 22.2* 20.5* 18.9*  MCV 101.8* 99.5 99.5  PLT 124* 118* 124*   Cardiac Enzymes: No results for input(s): CKTOTAL, CKMB, CKMBINDEX, TROPONINI in the last 168 hours. BNP (last 3 results) No results for input(s): PROBNP in the last 8760 hours. CBG: No results for input(s): GLUCAP in the last 168 hours. D-Dimer: No results for input(s): DDIMER in the last 72 hours. Hgb A1c: No results for input(s): HGBA1C in the last 72 hours. Lipid Profile: No results for  input(s): CHOL, HDL, LDLCALC, TRIG, CHOLHDL, LDLDIRECT in the last 72 hours. Thyroid function studies:  No results for input(s): TSH, T4TOTAL, T3FREE, THYROIDAB in the last 72 hours.  Invalid input(s): FREET3 Anemia work up: Recent Labs    04/05/20 0520  VITAMINB12 2,792*  FOLATE 78.0   Sepsis Labs: Recent Labs  Lab 04/04/20 0805 04/04/20 1439 04/05/20 0520  WBC 13.7* 12.3* 11.4*    Microbiology Recent Results (from the past 240 hour(s))  SARS Coronavirus 2 by RT PCR (hospital order, performed in [facility] hospital lab) Nasopharyngeal Nasopharyngeal Swab     Status: None   Collection Time: 04/04/20 12:11 PM   Specimen: Nasopharyngeal Swab  Result Value Ref Range Status   SARS Coronavirus 2 NEGATIVE NEGATIVE Final    Comment: (NOTE) SARS-CoV-2 target nucleic acids are NOT DETECTED.  The SARS-CoV-2 RNA is generally detectable in upper and lower respiratory specimens during the acute phase of infection. The lowest concentration of SARS-CoV-2 viral copies this assay can detect is 250 copies / mL. A negative result does not preclude SARS-CoV-2 infection and should not be used as the sole basis for treatment or other patient management decisions.  A negative result may occur with improper specimen collection / handling, submission of specimen other than nasopharyngeal swab, presence of viral mutation(s) within the areas targeted by this assay, and inadequate number of viral copies (<250 copies / mL). A negative result must be combined with clinical observations, patient history, and epidemiological information.  Fact Sheet for Patients:   BoilerBrush.com.cy  Fact Sheet for Healthcare Providers: https://pope.com/  This test is not yet approved or  cleared by the [place] FDA and has been authorized for detection and/or diagnosis of SARS-CoV-2 by FDA under an Emergency Use Authorization (EUA).  This EUA will remain in  effect (meaning this test can be used) for the duration of the COVID-19 declaration under Section 564(b)(1) of the Act, 21 U.S.C. section 360bbb-3(b)(1), unless the authorization is terminated or revoked sooner.  Performed at [facility], [address] [place], [place] [address]     Procedures and diagnostic studies:  US Abdomen Limited  Result Date: 04/04/2020 CLINICAL DATA:  History of alcoholic liver disease and ascites. Abdominal distension and jaundice. EXAM: LIMITED ABDOMEN ULTRASOUND FOR ASCITES TECHNIQUE: Limited ultrasound survey for ascites was performed in all four abdominal quadrants. COMPARISON:  Right upper quadrant abdominal ultrasound earlier today. FINDINGS: Survey of the peritoneal cavity demonstrates only a minimal amount of scattered ascites. There is not enough fluid to allow for paracentesis. IMPRESSION: Minimal ascites in the peritoneal cavity. Electronically Signed   By: Irish Lack M.D.   On: 04/04/2020 15:28   DG Chest Portable 1 View  Result Date: 04/04/2020 CLINICAL DATA:  Weakness EXAM: PORTABLE CHEST 1 VIEW COMPARISON:  01/24/2020 FINDINGS: Cardiac shadow is within normal limits. The lungs are well aerated bilaterally. Minimal platelike atelectasis in the right base is noted. No focal infiltrate or effusion is seen. IMPRESSION: Mild right basilar atelectasis. Electronically Signed   By: Alcide Clever M.D.   On: 04/04/2020 11:33   US ABDOMEN LIMITED RUQ  Result Date: 04/04/2020 CLINICAL DATA:  Jaundice for 2 days EXAM: ULTRASOUND ABDOMEN LIMITED RIGHT UPPER QUADRANT COMPARISON:  None. FINDINGS: Gallbladder: Well distended with gallbladder sludge. No wall thickening or pericholecystic fluid is noted. Common bile duct: Diameter: 3.6 mm. Liver: Increase in echogenicity consistent with fatty infiltration stable from the prior CT examination. Portal vein is patent on color Doppler imaging with normal direction of blood flow towards the liver. Other: None.  IMPRESSION: Mild ascites. Fatty liver. Gallbladder sludge.  Electronically Signed   By: Alcide CleverMark  Lukens M.D.   On: 04/04/2020 12:00               LOS: 1 day   Siriah Treat  [facility]     04/05/2020, 5:40 PM

## 2020-04-05 NOTE — Progress Notes (Signed)
Midge Minium, MD [facility]   [address], [address] [place], [place] [address] Phone: [phone] Fax : [phone]   Subjective: The patient is lethargic and unarousable.  The patient's mother is at bedside.  The patient has not been able to take any oral medications due to her lethargy.   Objective: Vital signs in last 24 hours: Vitals:   04/05/20 0501 04/05/20 0502 04/05/20 0741 04/05/20 1142  BP: 101/62 101/62 (!) 103/59 96/66  Pulse: (!) 111 (!) 101 (!) 109 (!) 108  Resp: 16 16 17 20   Temp: 98.3 F (36.8 C) 98.3 F (36.8 C) 98.2 F (36.8 C) 97.8 F (36.6 C)  TempSrc: Oral Oral Oral Oral  SpO2: 96% 96% 96% 92%  Weight:  51.3 kg    Height:       Weight change:   Intake/Output Summary (Last 24 hours) at 04/05/2020 1256 Last data filed at 04/05/2020 1142 Gross per 24 hour  Intake 200 ml  Output 0 ml  Net 200 ml     Exam: Heart:: Regular rate and rhythm, S1S2 present or without murmur or extra heart sounds Lungs: normal and clear to auscultation and percussion Abdomen: Patient cringes to palpation of the abdomen without any rebound or guarding positive bowel sounds.   Lab Results: @LABTEST2 @ Micro Results: Recent Results (from the past 240 hour(s))  SARS Coronavirus 2 by RT PCR (hospital order, performed in [facility] hospital lab) Nasopharyngeal Nasopharyngeal Swab     Status: None   Collection Time: 04/04/20 12:11 PM   Specimen: Nasopharyngeal Swab  Result Value Ref Range Status   SARS Coronavirus 2 NEGATIVE NEGATIVE Final    Comment: (NOTE) SARS-CoV-2 target nucleic acids are NOT DETECTED.  The SARS-CoV-2 RNA is generally detectable in upper and lower respiratory specimens during the acute phase of infection. The lowest concentration of SARS-CoV-2 viral copies this assay can detect is 250 copies / mL. A negative result does not preclude SARS-CoV-2 infection and should not be used as the sole basis for treatment or other patient management decisions.  A  negative result may occur with improper specimen collection / handling, submission of specimen other than nasopharyngeal swab, presence of viral mutation(s) within the areas targeted by this assay, and inadequate number of viral copies (<250 copies / mL). A negative result must be combined with clinical observations, patient history, and epidemiological information.  Fact Sheet for Patients:   [facility]  Fact Sheet for Healthcare Providers: 06/04/20  This test is not yet approved or  cleared by the [employer] FDA and has been authorized for detection and/or diagnosis of SARS-CoV-2 by FDA under an Emergency Use Authorization (EUA).  This EUA will remain in effect (meaning this test can be used) for the duration of the COVID-19 declaration under Section 564(b)(1) of the Act, 21 U.S.C. section 360bbb-3(b)(1), unless the authorization is terminated or revoked sooner.  Performed at [facility], [address], [place], [address] [place]    Studies/Results: [place] Abdomen Limited  Result Date: 04/04/2020 CLINICAL DATA:  History of alcoholic liver disease and ascites. Abdominal distension and jaundice. EXAM: LIMITED ABDOMEN ULTRASOUND FOR ASCITES TECHNIQUE: Limited ultrasound survey for ascites was performed in all four abdominal quadrants. COMPARISON:  Right upper quadrant abdominal ultrasound earlier today. FINDINGS: Survey of the peritoneal cavity demonstrates only a minimal amount of scattered ascites. There is not enough fluid to allow for paracentesis. IMPRESSION: Minimal ascites in the peritoneal cavity. Electronically Signed   By: [place] M.D.   On:  04/04/2020 15:28   DG Chest Portable 1 View  Result Date: 04/04/2020 CLINICAL DATA:  Weakness EXAM: PORTABLE CHEST 1 VIEW COMPARISON:  01/24/2020 FINDINGS: Cardiac shadow is within normal limits. The lungs are well aerated bilaterally. Minimal platelike  atelectasis in the right base is noted. No focal infiltrate or effusion is seen. IMPRESSION: Mild right basilar atelectasis. Electronically Signed   By: Alcide Clever M.D.   On: 04/04/2020 11:33   US ABDOMEN LIMITED RUQ  Result Date: 04/04/2020 CLINICAL DATA:  Jaundice for 2 days EXAM: ULTRASOUND ABDOMEN LIMITED RIGHT UPPER QUADRANT COMPARISON:  None. FINDINGS: Gallbladder: Well distended with gallbladder sludge. No wall thickening or pericholecystic fluid is noted. Common bile duct: Diameter: 3.6 mm. Liver: Increase in echogenicity consistent with fatty infiltration stable from the prior CT examination. Portal vein is patent on color Doppler imaging with normal direction of blood flow towards the liver. Other: None. IMPRESSION: Mild ascites. Fatty liver. Gallbladder sludge. Electronically Signed   By: Alcide Clever M.D.   On: 04/04/2020 12:00   Medications: I have reviewed the patient's current medications. Scheduled Meds: . sodium chloride   Intravenous Once  . Chlorhexidine Gluconate Cloth  6 each Topical Daily  . folic acid  1 mg Oral Daily  . lactulose  20 g Oral TID  . LORazepam  0-4 mg Intravenous Q4H   Followed by  . [START ON 04/06/2020] LORazepam  0-4 mg Intravenous Q8H  . multivitamin with minerals  1 tablet Oral Daily  . nicotine  21 mg Transdermal Daily  . [START ON 04/08/2020] pantoprazole  40 mg Intravenous Q12H  . predniSONE  40 mg Oral Q breakfast  . sodium chloride flush  3 mL Intravenous Once  . thiamine  100 mg Oral Daily   Or  . thiamine  100 mg Intravenous Daily   Continuous Infusions: . sodium chloride    . cefTRIAXone (ROCEPHIN)  IV Stopped (04/04/20 1855)  . pantoprozole (PROTONIX) infusion 8 mg/hr (04/05/20 0429)   PRN Meds:.LORazepam **OR** LORazepam, ondansetron **OR** ondansetron (ZOFRAN) IV, sodium chloride flush   Assessment: Principal Problem:   Acute alcoholic liver disease Active Problems:   Hypokalemia   Hyponatremia   Nicotine dependence   AKI  (acute kidney injury) (HCC)   Anemia    Plan: I have explained the patient's prognosis to the patient's mother.  I have also reiterated that we are doing everything we can to help improve her status.  She is dramatically different than she was yesterday with her being able to converse yesterday and today she is unarousable.  The patient's hemoglobin is 7 today down from 7.9 on admission and 7.6 yesterday afternoon.  There is no overt sign of bleeding.  I would recommend continuing the present course of action and if she does not start taking p.o. medications then she may need an NG tube.  The patient's mother is very resistant to that and states she will try to wake the patient up so that she can take her medications so that she will not need an NG tube.  All things considered this patient has a poor prognosis.   LOS: 1 day   Midge Minium 04/05/2020, 12:56 PM Pager [phone] 7am-5pm  Check AMION for 5pm -7am coverage and on weekends

## 2020-04-05 NOTE — TOC Initial Note (Signed)
Transition of Care [facility]) - Initial/Assessment Note    Patient Details  Name: Ily Denno MRN: [id number] Date of Birth: [birth year]  Transition of Care [facility]) CM/SW Contact:    Meriel Flavors, LCSW Phone Number: 04/05/2020, 3:07 PM  Clinical Narrative:                 Patient has a hx of alcohol abuse,CSW met with patient and spouse, Jenny Reichmann. Both confirmed patient has transportation, patient does not have insurance, was given open door resources, good Rx card, patient lives with husband in single family home and feels safe returning home with spouse. No  additional barriers identified at this time. Patients spouse states doctor's are estimating she has 0-3 months but he chooses to think positive.   Expected Discharge Plan: Home w Hospice Care Barriers to Discharge: Continued Medical Work up   Patient Goals and CMS Choice Patient states their goals for this hospitalization and ongoing recovery are:: Patient unsure of goals for outcome at this time   Choice offered to / list presented to : NA  Expected Discharge Plan and Services Expected Discharge Plan: Home w Hospice Care In-house Referral: Hospice / [facility], PCP / [facility] Discharge Planning Services: Other - See comment (Patient received referrals for medication assistance and pcp) Post Acute Care Choice: NA Living arrangements for the past 2 months: Single Family Home                           HH Arranged: NA [place] Agency: NA        Prior Living Arrangements/Services Living arrangements for the past 2 months: [place] Lives with:: Spouse Patient language and need for interpreter reviewed:: No Do you feel safe going back to the place where you live?: Yes      Need for Family Participation in Patient Care: Yes (Comment) Care giver support system in place?: Yes (comment)   Criminal Activity/Legal Involvement Pertinent to Current Situation/Hospitalization: No - Comment as needed  Activities of  Daily Living Home Assistive Devices/Equipment: None ADL Screening (condition at time of admission) Patient's cognitive ability adequate to safely complete daily activities?: Yes Is the patient deaf or have difficulty hearing?: No Does the patient have difficulty seeing, even when wearing glasses/contacts?: No Does the patient have difficulty concentrating, remembering, or making decisions?: No Patient able to express need for assistance with ADLs?: Yes Does the patient have difficulty dressing or bathing?: No Independently performs ADLs?: Yes (appropriate for developmental age) Does the patient have difficulty walking or climbing stairs?: No Weakness of Legs: Both Weakness of Arms/Hands: None  Permission Sought/Granted   Permission granted to share information with : Yes, Verbal Permission Granted        Permission granted to share info w Relationship: Spouse     Emotional Assessment Appearance:: Appears older than stated age Attitude/Demeanor/Rapport: Unable to Assess Affect (typically observed): Accepting Orientation: : Fluctuating Orientation (Suspected and/or reported Sundowners) Alcohol / Substance Use: Alcohol Use Psych Involvement: No (comment)  Admission diagnosis:  Acute alcoholic liver disease [A26.33] Hyponatremia [E87.1] Jaundice [R17] Ascites [R18.8] Acute liver failure without hepatic coma [K72.00] Patient Active Problem List   Diagnosis Date Noted  . Acute alcoholic liver disease 35/45/6256  . AKI (acute kidney injury) ([place]) 04/04/2020  . Anemia 04/04/2020  . Jaundice   . SIRS due to non-infectious process without acute organ dysfunction ([place]) 01/26/2020  . Claudication of both lower extremities ([place]) 01/26/2020  . Malnutrition  of moderate degree 01/25/2020  . Hypomagnesemia 01/24/2020  . Nicotine dependence 01/24/2020  . Lung nodule < 6cm on CT 01/24/2020  . Folate deficiency 12/24/2019  . Perianal abscess 12/22/2019  . Hypokalemia 12/22/2019  .  Hyponatremia 12/22/2019  . Macrocytosis 12/22/2019  . Elevated AST (SGOT) 12/22/2019  . Colon wall thickening 12/22/2019   PCP:  Patient, No Pcp Per Pharmacy:   [facility] [address] [place] [address] Phone: [phone] Fax: [phone]  [place] [address] [place] [address] Phone: [phone] Fax: [phone]     Social Determinants of Health (SDOH) Interventions    Readmission Risk Interventions Readmission Risk Prevention Plan 04/05/2020  Transportation Screening Complete  PCP or Specialist Appt within 3-5 Days Complete  HRI or [place] Not Complete  HRI or Home Care Consult comments Patient has very poor prognosis, likely to become hospice patient prior to discharge  Palliative Care Screening Complete  Medication Review (RN Care Manager) Complete  Some recent data might be hidden

## 2020-04-05 NOTE — Progress Notes (Signed)
Initial Nutrition Assessment  DOCUMENTATION CODES:   Severe malnutrition in context of social or environmental circumstances  INTERVENTION:   Ensure Enlive po TID, each supplement provides 350 kcal and 20 grams of protein  MVI, folic acid and thiamine daily in setting of etoh abuse   Recommend 500 mg of thiamine intravenously, infused over 30 minutes, three times daily for two consecutive days and 250 mg intravenously once daily for an additional five days  Pt is at high refeed risk; recommend monitor K, Mg and P labs daily   NUTRITION DIAGNOSIS:   Severe Malnutrition related to social / environmental circumstances (etoh abuse, suspected liver disease) as evidenced by moderate to severe fat depletions, moderate to severe muscle depletions, 26 percent weight loss in 1 year.  GOAL:   Patient will meet greater than or equal to 90% of their needs  MONITOR:   PO intake, Supplement acceptance, Labs, Weight trends, Skin, I & O's  REASON FOR ASSESSMENT:   Other (Comment) (Low BMI)    ASSESSMENT:   44 year old female with h/o etoh abuse and lung nodule who is s/p surgery for perirectal abscess and hemorrhoids on 4/26 and who is now admitted with suspected alcoholic liver disease.   Met with pt in room today. Pt quite lethargic today and unable to provide any history. Family at bedside reports pt with poor appetite and oral intake at baseline. Family also reports that pt has had significant weight loss. Per chart review, pt reported poor appetite and oral intake for 2 weeks pta. RD suspects pt with poor oral intake at baseline r/t etoh abuse. Family reports that pt has only eaten some applesauce today; pt's lunch tray was sitting on her side table untouched. Per chart, pt is down 40lbs(26%) over the past year; this is significant. RD will add supplements to help pt meet her estimated needs. Pt is at high refeed risk. Would recommend high dose IV thiamine as pt at risk for wernicke's.    Medications reviewed and include: folic acid, lactulose, MVI, nicotine, protonix, prednisone, thiamine, ceftriaxone  Labs reviewed: Na 123(L), Cl 80(L), BUN 58(H), creat 1.51(H), Mg 2.6(H) Wbc- 11.4(H), Hgb 7.0(L), Hct 18.9(L), MCH 36.8(H), MCHC 37.0(H)  NUTRITION - FOCUSED PHYSICAL EXAM:    Most Recent Value  Orbital Region Mild depletion  Upper Arm Region Severe depletion  Thoracic and Lumbar Region Moderate depletion  Buccal Region Mild depletion  Temple Region No depletion  Clavicle Bone Region Moderate depletion  Clavicle and Acromion Bone Region Moderate depletion  Scapular Bone Region Moderate depletion  Dorsal Hand Severe depletion  Patellar Region Severe depletion  Anterior Thigh Region Severe depletion  Posterior Calf Region Severe depletion  Edema (RD Assessment) None  Hair Reviewed  Eyes Reviewed  Mouth Reviewed  Skin Reviewed  Nails Reviewed     Diet Order:   Diet Order            Diet 2 gram sodium Room service appropriate? Yes; Fluid consistency: Thin  Diet effective now                EDUCATION NEEDS:   Not appropriate for education at this time  Skin:  Skin Assessment: Reviewed RN Assessment (petechiae, jaundice)  Last BM:  8/6  Height:   Ht Readings from Last 1 Encounters:  04/04/20 _0  (1.727 m)    Weight:   Wt Readings from Last 1 Encounters:  04/05/20 51.3 kg    Ideal Body Weight:  63.6 kg  BMI:  Body  mass index is 17.18 kg/m.  Estimated Nutritional Needs:   Kcal:  1700-1900kcal/day  Protein:  85-95g/day  Fluid:  >1.6L/day  Koleen Distance MS, RD, LDN Please refer to [facility] for RD and/or RD on-call/weekend/after hours pager

## 2020-04-05 NOTE — Progress Notes (Signed)
Pt difficult to arouse, and responds to pain.  Pt is disoriented x4 and unable to follow commands.  Dr. Myriam Forehand at bedside and aware of pt's neuro status.  Pt is unwilling to talk with HCP and answer questions.  Family arrived at bedside during assessment, and has been updated regarding her change of condition.  Plan is to transfuse PRBCs, give lactulose, and manage encephalopathy.    04/05/20 1142  Assess: MEWS Score  Temp 97.8 F (36.6 C)  BP 96/66  Pulse Rate (!) 108  Resp 20  SpO2 92 %  O2 Device Room Air  Assess: MEWS Score  MEWS Temp 0  MEWS Systolic 1  MEWS Pulse 1  MEWS RR 0  MEWS LOC 2  MEWS Score 4  MEWS Score Color Red  Assess: if the MEWS score is Yellow or Red  Were vital signs taken at a resting state? Yes  Focused Assessment No change from prior assessment  Early Detection of Sepsis Score *See Row Information* Low  MEWS guidelines implemented *See Row Information* Yes  Treat  MEWS Interventions Escalated (See documentation below)  Take Vital Signs  Increase Vital Sign Frequency  Red: Q 1hr X 4 then Q 4hr X 4, if remains red, continue Q 4hrs  Escalate  MEWS: Escalate Red: discuss with charge nurse/RN and provider, consider discussing with RRT  Notify: Charge Nurse/RN  Name of Charge Nurse/RN Notified Dellia Nims, RN  Date Charge Nurse/RN Notified 04/05/20  Time Charge Nurse/RN Notified 1151  Notify: Provider  Provider Name/Title Dr. Myriam Forehand  Date Provider Notified 04/05/20  Time Provider Notified 1152  Notification Type Rounds  Notification Reason Change in status  Response See new orders  Date of Provider Response 04/05/20  Time of Provider Response 1152  Document  Patient Outcome Stabilized after interventions  Progress note created (see row info) Yes

## 2020-04-05 NOTE — Plan of Care (Signed)

## 2020-04-06 ENCOUNTER — Inpatient Hospital Stay: Payer: Medicaid Other

## 2020-04-06 LAB — CBC WITH DIFFERENTIAL/PLATELET
Abs Immature Granulocytes: 0.14 10*3/uL — ABNORMAL HIGH (ref 0.00–0.07)
Basophils Absolute: 0 10*3/uL (ref 0.0–0.1)
Basophils Relative: 0 %
Eosinophils Absolute: 0 10*3/uL (ref 0.0–0.5)
Eosinophils Relative: 0 %
HCT: 24.4 % — ABNORMAL LOW (ref 36.0–46.0)
Hemoglobin: 8.5 g/dL — ABNORMAL LOW (ref 12.0–15.0)
Immature Granulocytes: 1 %
Lymphocytes Relative: 9 %
Lymphs Abs: 1.4 10*3/uL (ref 0.7–4.0)
MCH: 34.8 pg — ABNORMAL HIGH (ref 26.0–34.0)
MCHC: 34.8 g/dL (ref 30.0–36.0)
MCV: 100 fL (ref 80.0–100.0)
Monocytes Absolute: 0.9 10*3/uL (ref 0.1–1.0)
Monocytes Relative: 6 %
Neutro Abs: 12.5 10*3/uL — ABNORMAL HIGH (ref 1.7–7.7)
Neutrophils Relative %: 84 %
Platelets: 141 10*3/uL — ABNORMAL LOW (ref 150–400)
RBC: 2.44 MIL/uL — ABNORMAL LOW (ref 3.87–5.11)
RDW: 19.3 % — ABNORMAL HIGH (ref 11.5–15.5)
WBC: 14.9 10*3/uL — ABNORMAL HIGH (ref 4.0–10.5)
nRBC: 0.1 % (ref 0.0–0.2)

## 2020-04-06 LAB — COMPREHENSIVE METABOLIC PANEL
ALT: 60 U/L — ABNORMAL HIGH (ref 0–44)
AST: 225 U/L — ABNORMAL HIGH (ref 15–41)
Albumin: 2.3 g/dL — ABNORMAL LOW (ref 3.5–5.0)
Alkaline Phosphatase: 143 U/L — ABNORMAL HIGH (ref 38–126)
Anion gap: 14 (ref 5–15)
BUN: 52 mg/dL — ABNORMAL HIGH (ref 6–20)
CO2: 27 mmol/L (ref 22–32)
Calcium: 7.6 mg/dL — ABNORMAL LOW (ref 8.9–10.3)
Chloride: 88 mmol/L — ABNORMAL LOW (ref 98–111)
Creatinine, Ser: 1.14 mg/dL — ABNORMAL HIGH (ref 0.44–1.00)
GFR calc Af Amer: >60 mL/min (ref >60)
GFR calc non Af Amer: 59 mL/min — ABNORMAL LOW (ref >60)
Glucose, Bld: 127 mg/dL — ABNORMAL HIGH (ref 70–99)
Potassium: 3.9 mmol/L (ref 3.5–5.1)
Sodium: 129 mmol/L — ABNORMAL LOW (ref 135–145)
Total Bilirubin: 17.8 mg/dL — ABNORMAL HIGH (ref 0.3–1.2)
Total Protein: 5.3 g/dL — ABNORMAL LOW (ref 6.5–8.1)

## 2020-04-06 LAB — PROTIME-INR
INR: 1.4 — ABNORMAL HIGH (ref 0.8–1.2)
Prothrombin Time: 16.7 seconds — ABNORMAL HIGH (ref 11.4–15.2)

## 2020-04-06 LAB — MAGNESIUM: Magnesium: 2.7 mg/dL — ABNORMAL HIGH (ref 1.7–2.4)

## 2020-04-06 LAB — PHOSPHORUS: Phosphorus: UNDETERMINED mg/dL (ref 2.5–4.6)

## 2020-04-06 MED ORDER — OXYCODONE HCL 5 MG PO TABS
5.0000 mg | ORAL_TABLET | Freq: Four times a day (QID) | ORAL | Status: DC | PRN
  Administered 2020-04-06 – 2020-04-10 (×11): 5 mg via ORAL
  Filled 2020-04-06 (×11): qty 1

## 2020-04-06 MED ORDER — LACTULOSE ENEMA
300.0000 mL | Freq: Two times a day (BID) | ORAL | Status: DC
  Filled 2020-04-06: qty 300

## 2020-04-06 NOTE — Progress Notes (Signed)
Melodie Bouillon, MD [address], [place], [place], [address] [address], [place], [place], [address] Phone: [phone]  Fax: [phone]   Subjective: Pt awake and alert this morning. Husband at bedside. No evidence of bleeding   Objective: Exam: Vital signs in last 24 hours: Vitals:   04/05/20 1929 04/06/20 0451 04/06/20 0500 04/06/20 0835  BP: 110/79 109/69  114/73  Pulse: 100 (!) 110 100 (!) 121  Resp: 20 20  16   Temp: 98 F (36.7 C) 98 F (36.7 C)  98.4 F (36.9 C)  TempSrc: Oral Oral  Oral  SpO2:  97%  95%  Weight:  52.2 kg    Height:       Weight change: -2.268 kg  Intake/Output Summary (Last 24 hours) at 04/06/2020 1117 Last data filed at 04/06/2020 06/06/2020 Gross per 24 hour  Intake 1839 ml  Output 200 ml  Net 1639 ml    General: No acute distress, AAO x3 Abd: Soft, NT/ND, No HSM Skin: Warm, no rashes Neck: Supple, Trachea midline   Lab Results: Lab Results  Component Value Date   WBC 14.9 (H) 04/06/2020   HGB 8.5 (L) 04/06/2020   HCT 24.4 (L) 04/06/2020   MCV 100.0 04/06/2020   PLT 141 (L) 04/06/2020   Micro Results: Recent Results (from the past 240 hour(s))  SARS Coronavirus 2 by RT PCR (hospital order, performed in [facility] hospital lab) Nasopharyngeal Nasopharyngeal Swab     Status: None   Collection Time: 04/04/20 12:11 PM   Specimen: Nasopharyngeal Swab  Result Value Ref Range Status   SARS Coronavirus 2 NEGATIVE NEGATIVE Final    Comment: (NOTE) SARS-CoV-2 target nucleic acids are NOT DETECTED.  The SARS-CoV-2 RNA is generally detectable in upper and lower respiratory specimens during the acute phase of infection. The lowest concentration of SARS-CoV-2 viral copies this assay can detect is 250 copies / mL. A negative result does not preclude SARS-CoV-2 infection and should not be used as the sole basis for treatment or other patient management decisions.  A negative result may occur with improper specimen  collection / handling, submission of specimen other than nasopharyngeal swab, presence of viral mutation(s) within the areas targeted by this assay, and inadequate number of viral copies (<250 copies / mL). A negative result must be combined with clinical observations, patient history, and epidemiological information.  Fact Sheet for Patients:   06/04/20  Fact Sheet for Healthcare Providers: BoilerBrush.com.cy  This test is not yet approved or  cleared by the https://pope.com/ FDA and has been authorized for detection and/or diagnosis of SARS-CoV-2 by FDA under an Emergency Use Authorization (EUA).  This EUA will remain in effect (meaning this test can be used) for the duration of the COVID-19 declaration under Section 564(b)(1) of the Act, 21 U.S.C. section 360bbb-3(b)(1), unless the authorization is terminated or revoked sooner.  Performed at [facility], [address] [place], [place]    Studies/Results: CT HEAD WO CONTRAST  Result Date: 04/06/2020 CLINICAL DATA:  Mental status change, unknown cause. EXAM: CT HEAD WITHOUT CONTRAST TECHNIQUE: Contiguous axial images were obtained from the base of the skull through the vertex without intravenous contrast. COMPARISON:  No pertinent prior exams are available for comparison. FINDINGS: Brain: Mild generalized parenchymal atrophy. There is no acute intracranial hemorrhage. No demarcated cortical infarct is identified. No extra-axial fluid collection. No evidence of intracranial mass. No midline shift. Vascular: No hyperdense vessel. Skull: Normal. Negative for fracture or  focal lesion. Sinuses/Orbits: Visualized orbits show no acute finding. No significant paranasal sinus disease or mastoid effusion at the imaged levels. IMPRESSION: No CT evidence of acute intracranial abnormality. Mild generalized parenchymal atrophy. Electronically Signed   By: Jackey Loge DO   On:  04/06/2020 09:55   US Abdomen Limited  Result Date: 04/04/2020 CLINICAL DATA:  History of alcoholic liver disease and ascites. Abdominal distension and jaundice. EXAM: LIMITED ABDOMEN ULTRASOUND FOR ASCITES TECHNIQUE: Limited ultrasound survey for ascites was performed in all four abdominal quadrants. COMPARISON:  Right upper quadrant abdominal ultrasound earlier today. FINDINGS: Survey of the peritoneal cavity demonstrates only a minimal amount of scattered ascites. There is not enough fluid to allow for paracentesis. IMPRESSION: Minimal ascites in the peritoneal cavity. Electronically Signed   By: Irish Lack M.D.   On: 04/04/2020 15:28   DG Chest Portable 1 View  Result Date: 04/04/2020 CLINICAL DATA:  Weakness EXAM: PORTABLE CHEST 1 VIEW COMPARISON:  01/24/2020 FINDINGS: Cardiac shadow is within normal limits. The lungs are well aerated bilaterally. Minimal platelike atelectasis in the right base is noted. No focal infiltrate or effusion is seen. IMPRESSION: Mild right basilar atelectasis. Electronically Signed   By: Alcide Clever M.D.   On: 04/04/2020 11:33   US ABDOMEN LIMITED RUQ  Result Date: 04/04/2020 CLINICAL DATA:  Jaundice for 2 days EXAM: ULTRASOUND ABDOMEN LIMITED RIGHT UPPER QUADRANT COMPARISON:  None. FINDINGS: Gallbladder: Well distended with gallbladder sludge. No wall thickening or pericholecystic fluid is noted. Common bile duct: Diameter: 3.6 mm. Liver: Increase in echogenicity consistent with fatty infiltration stable from the prior CT examination. Portal vein is patent on color Doppler imaging with normal direction of blood flow towards the liver. Other: None. IMPRESSION: Mild ascites. Fatty liver. Gallbladder sludge. Electronically Signed   By: Alcide Clever M.D.   On: 04/04/2020 12:00   Medications:  Scheduled Meds: . Chlorhexidine Gluconate Cloth  6 each Topical Daily  . feeding supplement (ENSURE ENLIVE)  237 mL Oral TID BM  . folic acid  1 mg Oral Daily  . lactulose   300 mL Rectal BID  . LORazepam  0-4 mg Intravenous Q4H   Followed by  . LORazepam  0-4 mg Intravenous Q8H  . multivitamin with minerals  1 tablet Oral Daily  . nicotine  21 mg Transdermal Daily  . [START ON 04/08/2020] pantoprazole  40 mg Intravenous Q12H  . predniSONE  40 mg Oral Q breakfast  . sodium chloride flush  3 mL Intravenous Once  . thiamine  100 mg Oral Daily   Or  . thiamine  100 mg Intravenous Daily   Continuous Infusions: . cefTRIAXone (ROCEPHIN)  IV Stopped (04/05/20 1845)  . pantoprozole (PROTONIX) infusion 8 mg/hr (04/06/20 0027)   PRN Meds:.LORazepam **OR** LORazepam, ondansetron **OR** ondansetron (ZOFRAN) IV, sodium chloride flush   Assessment: Principal Problem:   Acute alcoholic liver disease Active Problems:   Hypokalemia   Hyponatremia   Nicotine dependence   AKI (acute kidney injury) (HCC)   Anemia    Plan: Bilirubin improved from yesterday Husband states pt is more conversive today Prognosis guarded but pt seems to be showing some improvement  No overt signs of bleeding. Hgb stable  Continue to avoid hepatotoxic drugs Monitor daily CMP  Encourage abstinence  Oral nutrition as tolerated  Folate, Thiamine   LOS: 2 days   Melodie Bouillon, MD 04/06/2020, 11:17 AM

## 2020-04-06 NOTE — Progress Notes (Signed)
Discussed with husband the purpose of the Lactulose enema and how the patient would be prepared for it, Upon explaining the purpose the husband very clearly stated that my wife would not tolerate that in any way and could be very uncomfortable for her to go through that and on her behalf declined the enema for her.

## 2020-04-06 NOTE — Progress Notes (Addendum)
Progress Note    Jill Reyes  UJW:[id number] DOB: 07/07/1976  DOA: 04/04/2020 PCP: Patient, No Pcp Per      Brief Narrative:    Medical records reviewed and are as summarized below:  Jill Reyes is a 44 y.o. 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  CT head was obtained on 04/06/2020 but there was no evidence of acute abnormality.  Continue lactulose and prednisone.  Follow-up with gastroenterologist.  Severe anemia, reported history of recent melena: Hemoglobin was 12.6 on 01/25/2020.  S/p transfusion with 1 unit of packed red blood cells on 04/05/2020. Stool for occult blood is pending.  Continue IV Protonix.  Monitor H&H.  Hyponatremia: Slowly improving.  Monitor sodium level.  Hypokalemia: Improved continue to monitor.  Acute kidney injury: Creatinine is trending down.  Continue IV fluids but decrease rate to 50 cc/h to avoid fluid overload.  Abnormal urinalysis,?  Acute UTI, leukocytosis: Continue IV Rocephin.  Urine culture is pending.  Thrombocytopenia: This is likely from liver disease  Alcohol use disorder: Continue thiamine, folic acid and Ativan as needed.   Body mass index is 17.49 kg/m.  (Underweight)/severe protein calorie malnutrition  Diet Order            Diet 2 gram sodium Room service appropriate? Yes; Fluid consistency: Thin  Diet effective now                       Medications:   . Chlorhexidine Gluconate Cloth  6 each Topical Daily  . feeding supplement (ENSURE ENLIVE)  237 mL Oral TID BM  . folic acid  1 mg Oral Daily  . lactulose  300 mL Rectal BID   . LORazepam  0-4 mg Intravenous Q4H   Followed by  . LORazepam  0-4 mg Intravenous Q8H  . multivitamin with minerals  1 tablet Oral Daily  . nicotine  21 mg Transdermal Daily  . [START ON 04/08/2020] pantoprazole  40 mg Intravenous Q12H  . predniSONE  40 mg Oral Q breakfast  . sodium chloride flush  3 mL Intravenous Once  . thiamine  100 mg Oral Daily   Or  . thiamine  100 mg Intravenous Daily   Continuous Infusions: . cefTRIAXone (ROCEPHIN)  IV Stopped (04/05/20 1845)  . pantoprozole (PROTONIX) infusion 8 mg/hr (04/06/20 0027)     Anti-infectives (From admission, onward)   Start     Dose/Rate Route Frequency Ordered Stop   04/04/20 1600  cefTRIAXone (ROCEPHIN) 2 g in sodium chloride 0.9 % 100 mL IVPB     Discontinue     2 g 200 mL/hr over 30 Minutes Intravenous Every 24 hours 04/04/20 1558               Family Communication/Anticipated D/C date and plan/Code Status   DVT prophylaxis: SCDs Start: 04/04/20 1350     Code Status: Full Code  Family Communication: Plan discussed with husband at the bedside Disposition Plan:    Status is: Inpatient  Remains inpatient appropriate because:IV treatments appropriate due to intensity of illness or inability to take PO   Dispo: The  patient is from: Home              Anticipated d/c is to: Home              Anticipated d/c date is: > 3 days              Patient currently is not medically stable to d/c.           Subjective:   She is confused unable to provide any history.  Her husband and [name], [name] fundraiser were at the bedside.  Objective:    Vitals:   04/06/20 0451 04/06/20 0500 04/06/20 0835 04/06/20 1131  BP: 109/69  114/73 102/66  Pulse: (!) 110 100 (!) 121 (!) 109  Resp: 20  16 17   Temp: 98 F (36.7 C)  98.4 F (36.9 C) 97.7 F (36.5 C)  TempSrc: Oral  Oral Oral  SpO2: 97%  95% 96%  Weight: 52.2 kg     Height:       No data found.   Intake/Output Summary (Last 24 hours) at 04/06/2020 1229 Last  data filed at 04/06/2020 0452 Gross per 24 hour  Intake 1839 ml  Output 200 ml  Net 1639 ml   Filed Weights   04/04/20 2023 04/05/20 0502 04/06/20 0451  Weight: 51.3 kg 51.3 kg 52.2 kg    Exam:  GEN: NAD SKIN: Jaundice, spider angioma on the chest and face EYES: icteric ENT: MMM CV: RRR PULM: No wheezing or rales heard ABD: soft, mildly distended, NT, +BS CNS: Alert, oriented to person only.  She is confused and she does not follow commands. EXT: No edema or tenderness   Data Reviewed:   I have personally reviewed following labs and imaging studies:  Labs: Labs show the following:   Basic Metabolic Panel: Recent Labs  Lab 04/04/20 0805 04/04/20 0805 04/04/20 1439 04/05/20 0520 04/05/20 0520 04/05/20 1808 04/06/20 0337  NA 119*  --   --  123*  --  127* 129*  K 2.6*   < >  --  3.7   < > 4.0 3.9  CL 68*  --   --  80*  --  84* 88*  CO2 31  --   --  29  --  29 27  GLUCOSE 99  --   --  121*  --  133* 127*  BUN 50*  --   --  58*  --  56* 52*  CREATININE 1.55*  --   --  1.51*  --  1.32* 1.14*  CALCIUM 8.4*  --   --  7.4*  --  7.6* 7.6*  MG  --   --  2.6*  --   --   --  2.7*  PHOS  --   --  UNABLE TO REPORT DUE TO ICTERUS  --   --   --  UNABLE TO REPORT DUE TO ICTERUS   < > = values in this interval not displayed.   GFR Estimated Creatinine Clearance: 53 mL/min (A) (by C-G formula based on SCr of 1.14 mg/dL (H)). Liver Function Tests: Recent Labs  Lab 04/04/20 0805 04/06/20 0337  AST 174* 225*  ALT 50* 60*  ALKPHOS 146* 143*  BILITOT 20.1* 17.8*  PROT 5.7* 5.3*  ALBUMIN 2.5* 2.3*   Recent Labs  Lab 04/04/20 0805  LIPASE 49   Recent Labs  Lab 04/04/20 1054  AMMONIA RESULTS UNAVAILABLE DUE TO INTERFERING SUBSTANCE   Coagulation profile Recent Labs  Lab 04/04/20 1036  04/06/20 0337  INR 1.3* 1.4*    CBC: Recent Labs  Lab 04/04/20 0805 04/04/20 1439 04/05/20 0520 04/05/20 1808 04/06/20 0337  WBC 13.7* 12.3* 11.4*  --  14.9*  NEUTROABS  --    --   --   --  12.5*  HGB 7.9* 7.6* 7.0* 8.6* 8.5*  HCT 22.2* 20.5* 18.9* 24.5* 24.4*  MCV 101.8* 99.5 99.5  --  100.0  PLT 124* 118* 124*  --  141*   Cardiac Enzymes: No results for input(s): CKTOTAL, CKMB, CKMBINDEX, TROPONINI in the last 168 hours. BNP (last 3 results) No results for input(s): PROBNP in the last 8760 hours. CBG: No results for input(s): GLUCAP in the last 168 hours. D-Dimer: No results for input(s): DDIMER in the last 72 hours. Hgb A1c: No results for input(s): HGBA1C in the last 72 hours. Lipid Profile: No results for input(s): CHOL, HDL, LDLCALC, TRIG, CHOLHDL, LDLDIRECT in the last 72 hours. Thyroid function studies: No results for input(s): TSH, T4TOTAL, T3FREE, THYROIDAB in the last 72 hours.  Invalid input(s): FREET3 Anemia work up: Recent Labs    04/05/20 0520  VITAMINB12 2,792*  FOLATE 78.0   Sepsis Labs: Recent Labs  Lab 04/04/20 0805 04/04/20 1439 04/05/20 0520 04/06/20 0337  WBC 13.7* 12.3* 11.4* 14.9*    Microbiology Recent Results (from the past 240 hour(s))  SARS Coronavirus 2 by RT PCR (hospital order, performed in [facility] hospital lab) Nasopharyngeal Nasopharyngeal Swab     Status: None   Collection Time: 04/04/20 12:11 PM   Specimen: Nasopharyngeal Swab  Result Value Ref Range Status   SARS Coronavirus 2 NEGATIVE NEGATIVE Final    Comment: (NOTE) SARS-CoV-2 target nucleic acids are NOT DETECTED.  The SARS-CoV-2 RNA is generally detectable in upper and lower respiratory specimens during the acute phase of infection. The lowest concentration of SARS-CoV-2 viral copies this assay can detect is 250 copies / mL. A negative result does not preclude SARS-CoV-2 infection and should not be used as the sole basis for treatment or other patient management decisions.  A negative result may occur with improper specimen collection / handling, submission of specimen other than nasopharyngeal swab, presence of viral mutation(s) within  the areas targeted by this assay, and inadequate number of viral copies (<250 copies / mL). A negative result must be combined with clinical observations, patient history, and epidemiological information.  Fact Sheet for Patients:   BoilerBrush.com.cy  Fact Sheet for Healthcare Providers: https://pope.com/  This test is not yet approved or  cleared by the [place] FDA and has been authorized for detection and/or diagnosis of SARS-CoV-2 by FDA under an Emergency Use Authorization (EUA).  This EUA will remain in effect (meaning this test can be used) for the duration of the COVID-19 declaration under Section 564(b)(1) of the Act, 21 U.S.C. section 360bbb-3(b)(1), unless the authorization is terminated or revoked sooner.  Performed at [facility], [address] [place], [place] [address]     Procedures and diagnostic studies:  CT HEAD WO CONTRAST  Result Date: 04/06/2020 CLINICAL DATA:  Mental status change, unknown cause. EXAM: CT HEAD WITHOUT CONTRAST TECHNIQUE: Contiguous axial images were obtained from the base of the skull through the vertex without intravenous contrast. COMPARISON:  No pertinent prior exams are available for comparison. FINDINGS: Brain: Mild generalized parenchymal atrophy. There is no acute intracranial hemorrhage. No demarcated cortical infarct is identified. No extra-axial fluid collection. No evidence of intracranial mass. No midline shift. Vascular: No hyperdense vessel. Skull:  Normal. Negative for fracture or focal lesion. Sinuses/Orbits: Visualized orbits show no acute finding. No significant paranasal sinus disease or mastoid effusion at the imaged levels. IMPRESSION: No CT evidence of acute intracranial abnormality. Mild generalized parenchymal atrophy. Electronically Signed   By: Jackey Loge DO   On: 04/06/2020 09:55   US Abdomen Limited  Result Date: 04/04/2020 CLINICAL DATA:  History of  alcoholic liver disease and ascites. Abdominal distension and jaundice. EXAM: LIMITED ABDOMEN ULTRASOUND FOR ASCITES TECHNIQUE: Limited ultrasound survey for ascites was performed in all four abdominal quadrants. COMPARISON:  Right upper quadrant abdominal ultrasound earlier today. FINDINGS: Survey of the peritoneal cavity demonstrates only a minimal amount of scattered ascites. There is not enough fluid to allow for paracentesis. IMPRESSION: Minimal ascites in the peritoneal cavity. Electronically Signed   By: Irish Lack M.D.   On: 04/04/2020 15:28               LOS: 2 days   Inette Doubrava  [facility]     04/06/2020, 12:29 PM

## 2020-04-07 LAB — URINE CULTURE: Culture: <10000 — AB

## 2020-04-07 LAB — COMPREHENSIVE METABOLIC PANEL
ALT: 67 U/L — ABNORMAL HIGH (ref 0–44)
AST: 228 U/L — ABNORMAL HIGH (ref 15–41)
Albumin: 2.1 g/dL — ABNORMAL LOW (ref 3.5–5.0)
Alkaline Phosphatase: 131 U/L — ABNORMAL HIGH (ref 38–126)
Anion gap: 9 (ref 5–15)
BUN: 45 mg/dL — ABNORMAL HIGH (ref 6–20)
CO2: 29 mmol/L (ref 22–32)
Calcium: 7.9 mg/dL — ABNORMAL LOW (ref 8.9–10.3)
Chloride: 93 mmol/L — ABNORMAL LOW (ref 98–111)
Creatinine, Ser: 1.17 mg/dL — ABNORMAL HIGH (ref 0.44–1.00)
GFR calc Af Amer: >60 mL/min (ref >60)
GFR calc non Af Amer: 57 mL/min — ABNORMAL LOW (ref >60)
Glucose, Bld: 96 mg/dL (ref 70–99)
Potassium: 3.7 mmol/L (ref 3.5–5.1)
Sodium: 131 mmol/L — ABNORMAL LOW (ref 135–145)
Total Bilirubin: 16 mg/dL — ABNORMAL HIGH (ref 0.3–1.2)
Total Protein: 5 g/dL — ABNORMAL LOW (ref 6.5–8.1)

## 2020-04-07 LAB — CBC WITH DIFFERENTIAL/PLATELET
Abs Immature Granulocytes: 0.14 10*3/uL — ABNORMAL HIGH (ref 0.00–0.07)
Basophils Absolute: 0 10*3/uL (ref 0.0–0.1)
Basophils Relative: 0 %
Eosinophils Absolute: 0.1 10*3/uL (ref 0.0–0.5)
Eosinophils Relative: 1 %
HCT: 23.7 % — ABNORMAL LOW (ref 36.0–46.0)
Hemoglobin: 8.2 g/dL — ABNORMAL LOW (ref 12.0–15.0)
Immature Granulocytes: 1 %
Lymphocytes Relative: 18 %
Lymphs Abs: 2.2 10*3/uL (ref 0.7–4.0)
MCH: 35.2 pg — ABNORMAL HIGH (ref 26.0–34.0)
MCHC: 34.6 g/dL (ref 30.0–36.0)
MCV: 101.7 fL — ABNORMAL HIGH (ref 80.0–100.0)
Monocytes Absolute: 0.8 10*3/uL (ref 0.1–1.0)
Monocytes Relative: 6 %
Neutro Abs: 9.4 10*3/uL — ABNORMAL HIGH (ref 1.7–7.7)
Neutrophils Relative %: 74 %
Platelets: 125 10*3/uL — ABNORMAL LOW (ref 150–400)
RBC: 2.33 MIL/uL — ABNORMAL LOW (ref 3.87–5.11)
RDW: 19.6 % — ABNORMAL HIGH (ref 11.5–15.5)
WBC: 12.7 10*3/uL — ABNORMAL HIGH (ref 4.0–10.5)
nRBC: 0.2 % (ref 0.0–0.2)

## 2020-04-07 LAB — TYPE AND SCREEN
ABO/RH(D): A POS
Antibody Screen: NEGATIVE
Unit division: 0

## 2020-04-07 LAB — BPAM RBC
Blood Product Expiration Date: 202108222359
ISSUE DATE / TIME: 202108061352
Unit Type and Rh: 6200

## 2020-04-07 LAB — PROTIME-INR
INR: 1.4 — ABNORMAL HIGH (ref 0.8–1.2)
Prothrombin Time: 16.9 seconds — ABNORMAL HIGH (ref 11.4–15.2)

## 2020-04-07 MED ORDER — LACTULOSE 10 GM/15ML PO SOLN
30.0000 g | Freq: Three times a day (TID) | ORAL | Status: DC
  Administered 2020-04-07 – 2020-04-09 (×6): 30 g via ORAL
  Administered 2020-04-09: 20 g via ORAL
  Filled 2020-04-07 (×7): qty 60

## 2020-04-07 MED ORDER — SODIUM CHLORIDE 0.9 % IV SOLN: INTRAVENOUS | Status: DC

## 2020-04-07 NOTE — Progress Notes (Signed)
Melodie Bouillon, MD [address], [address], [place], [place], [address] [address], [place], [place], [address] Phone: [phone]  Fax: [phone]   Subjective: Patient awake and alert and resting in bed.  No nausea or vomiting.  No episodes of bleeding.   Objective: Exam: Vital signs in last 24 hours: Vitals:   04/07/20 0321 04/07/20 0851 04/07/20 1139 04/07/20 1637  BP: 105/75 (!) 87/60 102/78 106/70  Pulse: (!) 110 (!) 110 (!) 120 (!) 117  Resp: 20 16 16 16   Temp: 98 F (36.7 C) 97.6 F (36.4 C) 97.7 F (36.5 C) 98.4 F (36.9 C)  TempSrc:  Oral  Oral  SpO2: 98% 97%  96%  Weight:      Height:       Weight change:   Intake/Output Summary (Last 24 hours) at 04/07/2020 1642 Last data filed at 04/07/2020 0300 Gross per 24 hour  Intake 288.96 ml  Output --  Net 288.96 ml    General: No acute distress, AAO x3 Abd: Soft, NT/ND, No HSM Skin: Warm, no rashes Neck: Supple, Trachea midline   Lab Results: Lab Results  Component Value Date   WBC 12.7 (H) 04/07/2020   HGB 8.2 (L) 04/07/2020   HCT 23.7 (L) 04/07/2020   MCV 101.7 (H) 04/07/2020   PLT 125 (L) 04/07/2020   Micro Results: Recent Results (from the past 240 hour(s))  SARS Coronavirus 2 by RT PCR (hospital order, performed in [facility] hospital lab) Nasopharyngeal Nasopharyngeal Swab     Status: None   Collection Time: 04/04/20 12:11 PM   Specimen: Nasopharyngeal Swab  Result Value Ref Range Status   SARS Coronavirus 2 NEGATIVE NEGATIVE Final    Comment: (NOTE) SARS-CoV-2 target nucleic acids are NOT DETECTED.  The SARS-CoV-2 RNA is generally detectable in upper and lower respiratory specimens during the acute phase of infection. The lowest concentration of SARS-CoV-2 viral copies this assay can detect is 250 copies / mL. A negative result does not preclude SARS-CoV-2 infection and should not be used as the sole basis for treatment or other patient management decisions.  A  negative result may occur with improper specimen collection / handling, submission of specimen other than nasopharyngeal swab, presence of viral mutation(s) within the areas targeted by this assay, and inadequate number of viral copies (<250 copies / mL). A negative result must be combined with clinical observations, patient history, and epidemiological information.  Fact Sheet for Patients:   06/04/20  Fact Sheet for Healthcare Providers: BoilerBrush.com.cy  This test is not yet approved or  cleared by the https://pope.com/ FDA and has been authorized for detection and/or diagnosis of SARS-CoV-2 by FDA under an Emergency Use Authorization (EUA).  This EUA will remain in effect (meaning this test can be used) for the duration of the COVID-19 declaration under Section 564(b)(1) of the Act, 21 U.S.C. section 360bbb-3(b)(1), unless the authorization is terminated or revoked sooner.  Performed at [facility], [address], [place], [place]   Urine Culture     Status: Abnormal   Collection Time: 04/05/20  5:40 PM   Specimen: Urine, Random  Result Value Ref Range Status   Specimen Description   Final    URINE, RANDOM Performed at [facility], [address] [place], [place]    Special Requests   Final    NONE Performed at [facility], [address], [place], [place]    Culture (A)  Final    <  10,000 COLONIES/mL INSIGNIFICANT GROWTH Performed at [facility], [address], [place], [place] [address]    Report Status 04/07/2020 FINAL  Final   Studies/Results: CT HEAD WO CONTRAST  Result Date: 04/06/2020 CLINICAL DATA:  Mental status change, unknown cause. EXAM: CT HEAD WITHOUT CONTRAST TECHNIQUE: Contiguous axial images were obtained from the base of the skull through the vertex without intravenous contrast. COMPARISON:  No pertinent prior exams are  available for comparison. FINDINGS: Brain: Mild generalized parenchymal atrophy. There is no acute intracranial hemorrhage. No demarcated cortical infarct is identified. No extra-axial fluid collection. No evidence of intracranial mass. No midline shift. Vascular: No hyperdense vessel. Skull: Normal. Negative for fracture or focal lesion. Sinuses/Orbits: Visualized orbits show no acute finding. No significant paranasal sinus disease or mastoid effusion at the imaged levels. IMPRESSION: No CT evidence of acute intracranial abnormality. Mild generalized parenchymal atrophy. Electronically Signed   By: Jackey Loge DO   On: 04/06/2020 09:55   Medications:  Scheduled Meds: . Chlorhexidine Gluconate Cloth  6 each Topical Daily  . feeding supplement (ENSURE ENLIVE)  237 mL Oral TID BM  . folic acid  1 mg Oral Daily  . lactulose  30 g Oral TID  . LORazepam  0-4 mg Intravenous Q8H  . multivitamin with minerals  1 tablet Oral Daily  . nicotine  21 mg Transdermal Daily  . [START ON 04/08/2020] pantoprazole  40 mg Intravenous Q12H  . predniSONE  40 mg Oral Q breakfast  . sodium chloride flush  3 mL Intravenous Once  . thiamine  100 mg Oral Daily   Or  . thiamine  100 mg Intravenous Daily   Continuous Infusions: . sodium chloride 75 mL/hr at 04/07/20 1454   PRN Meds:.ondansetron **OR** ondansetron (ZOFRAN) IV, oxyCODONE, sodium chloride flush   Assessment: Principal Problem:   Acute alcoholic liver disease Active Problems:   Hypokalemia   Hyponatremia   Nicotine dependence   AKI (acute kidney injury) (HCC)   Anemia    Plan: Bilirubin continues to improve Mental status improved too Calculate Lillie score on day 7, however, given improvement, continue steroids for now  Continue daily CMP Avoid hepatotoxic drugs Encourage abstinence Folate, thiamine  Encourage good oral nutrition  Dr. Allegra Lai to follow from tomorrow   LOS: 3 days   Melodie Bouillon, MD 04/07/2020, 4:42 PM

## 2020-04-07 NOTE — Progress Notes (Addendum)
Progress Note    Jill Reyes  JKK:[id number] DOB: 31-Aug-1976  DOA: 04/04/2020 PCP: Patient, No Pcp Per      Brief Narrative:    Medical records reviewed and are as summarized below:  Jill Reyes is a 44 y.o. 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  CT head was obtained on 04/06/2020 but there was no evidence of acute abnormality.  Continue lactulose (has been changed from per rectum to oral route) and prednisone.  Follow-up with gastroenterologist.  Severe anemia, reported history of recent melena: Hemoglobin was 12.6 on 01/25/2020.  S/p transfusion with 1 unit of packed red blood cells on 04/05/2020. Stool for occult blood is pending.  Continue IV Protonix.  Monitor H&H.  Hyponatremia: Slowly improving.  Monitor sodium level.  Hypokalemia: Improved continue to monitor.  Hypotension: Treat with IV fluids.  Acute kidney injury: No significant change in creatinine compared to yesterday.  Treat with IV fluids.   Abnormal urinalysis, questionable acute UTI, leukocytosis: Insignificant growth on urine culture.  She has received 3 days worth of IV Rocephin.  Discontinue IV Rocephin.   Thrombocytopenia: This is likely from liver disease  Alcohol use disorder: Continue thiamine, folic acid and Ativan as needed.  Prognosis is poor.   Body mass index is 17.49 kg/m.  (Underweight)/severe protein calorie malnutrition   Diet Order            Diet 2 gram sodium Room service appropriate? Yes; Fluid consistency: Thin  Diet effective now                        Medications:   . Chlorhexidine Gluconate Cloth  6 each Topical Daily  . feeding supplement (ENSURE ENLIVE)  237 mL Oral TID BM  . folic acid  1 mg Oral Daily  . lactulose  30 g Oral TID  . LORazepam  0-4 mg Intravenous Q8H  . multivitamin with minerals  1 tablet Oral Daily  . nicotine  21 mg Transdermal Daily  . [START ON 04/08/2020] pantoprazole  40 mg Intravenous Q12H  . predniSONE  40 mg Oral Q breakfast  . sodium chloride flush  3 mL Intravenous Once  . thiamine  100 mg Oral Daily   Or  . thiamine  100 mg Intravenous Daily   Continuous Infusions: . sodium chloride       Anti-infectives (From admission, onward)   Start     Dose/Rate Route Frequency Ordered Stop   04/04/20 1600  cefTRIAXone (ROCEPHIN) 2 g in sodium chloride 0.9 % 100 mL IVPB  Status:  Discontinued        2 g 200 mL/hr over 30 Minutes Intravenous Every 24 hours 04/04/20 1558 04/07/20 1418             Family Communication/Anticipated D/C date and plan/Code Status   DVT prophylaxis: SCDs Start: 04/04/20 1350     Code Status: Full Code  Family Communication: Plan discussed with husband at the bedside Disposition Plan:    Status is: Inpatient  Remains inpatient appropriate because:IV treatments appropriate due to intensity of illness or inability  to take PO   Dispo: The patient is from: Home              Anticipated d/c is to: Home              Anticipated d/c date is: > 3 days              Patient currently is not medically stable to d/c.           Subjective:   Overnight events noted.  Patient's husband did not want her to have lactulose per rectum yesterday.  The patient is still confused and cannot provide any history.  She does not answer my questions.  Her husband is at the bedside.  Objective:    Vitals:   04/06/20 2302 04/07/20 0321 04/07/20 0851 04/07/20 1139  BP: 95/65 105/75 (!) 87/60 102/78  Pulse: (!) 108 (!) 110 (!) 110 (!) 120  Resp:  20 16 16    Temp:  98 F (36.7 C) 97.6 F (36.4 C) 97.7 F (36.5 C)  TempSrc:   Oral   SpO2:  98% 97%   Weight:      Height:       No data found.   Intake/Output Summary (Last 24 hours) at 04/07/2020 1419 Last data filed at 04/07/2020 0300 Gross per 24 hour  Intake 288.96 ml  Output --  Net 288.96 ml   Filed Weights   04/04/20 2023 04/05/20 0502 04/06/20 0451  Weight: 51.3 kg 51.3 kg 52.2 kg    Exam:  GEN: NAD SKIN: Jaundice, spider angioma on the chest and face EYES: icteric ENT: MMM CV: Tachycardic PULM: No wheezing or rales heard ABD: soft, mildly distended, NT, +BS CNS: Alert but confused.  She does not answer any questions and does not follow commands. EXT: No edema or tenderness   Data Reviewed:   I have personally reviewed following labs and imaging studies:  Labs: Labs show the following:   Basic Metabolic Panel: Recent Labs  Lab 04/04/20 0805 04/04/20 0805 04/04/20 1439 04/05/20 0520 04/05/20 0520 04/05/20 1808 04/05/20 1808 04/06/20 0337 04/07/20 0505  NA 119*  --   --  123*  --  127*  --  129* 131*  K 2.6*   < >  --  3.7   < > 4.0   < > 3.9 3.7  CL 68*  --   --  80*  --  84*  --  88* 93*  CO2 31  --   --  29  --  29  --  27 29  GLUCOSE 99  --   --  121*  --  133*  --  127* 96  BUN 50*  --   --  58*  --  56*  --  52* 45*  CREATININE 1.55*  --   --  1.51*  --  1.32*  --  1.14* 1.17*  CALCIUM 8.4*  --   --  7.4*  --  7.6*  --  7.6* 7.9*  MG  --   --  2.6*  --   --   --   --  2.7*  --   PHOS  --   --  UNABLE TO REPORT DUE TO ICTERUS  --   --   --   --  UNABLE TO REPORT DUE TO ICTERUS  --    < > = values in this interval not displayed.   GFR Estimated Creatinine Clearance: 51.6 mL/min (A) (by C-G formula  based on SCr of 1.17 mg/dL (H)). Liver Function Tests: Recent Labs  Lab 04/04/20 0805 04/06/20 0337 04/07/20 0505  AST 174* 225* 228*  ALT 50* 60* 67*  ALKPHOS 146* 143* 131*  BILITOT 20.1* 17.8* 16.0*  PROT 5.7* 5.3* 5.0*  ALBUMIN 2.5* 2.3*  2.1*   Recent Labs  Lab 04/04/20 0805  LIPASE 49   Recent Labs  Lab 04/04/20 1054  AMMONIA RESULTS UNAVAILABLE DUE TO INTERFERING SUBSTANCE   Coagulation profile Recent Labs  Lab 04/04/20 1036 04/06/20 0337 04/07/20 0505  INR 1.3* 1.4* 1.4*    CBC: Recent Labs  Lab 04/04/20 0805 04/04/20 0805 04/04/20 1439 04/05/20 0520 04/05/20 1808 04/06/20 0337 04/07/20 0505  WBC 13.7*  --  12.3* 11.4*  --  14.9* 12.7*  NEUTROABS  --   --   --   --   --  12.5* 9.4*  HGB 7.9*   < > 7.6* 7.0* 8.6* 8.5* 8.2*  HCT 22.2*   < > 20.5* 18.9* 24.5* 24.4* 23.7*  MCV 101.8*  --  99.5 99.5  --  100.0 101.7*  PLT 124*  --  118* 124*  --  141* 125*   < > = values in this interval not displayed.   Cardiac Enzymes: No results for input(s): CKTOTAL, CKMB, CKMBINDEX, TROPONINI in the last 168 hours. BNP (last 3 results) No results for input(s): PROBNP in the last 8760 hours. CBG: No results for input(s): GLUCAP in the last 168 hours. D-Dimer: No results for input(s): DDIMER in the last 72 hours. Hgb A1c: No results for input(s): HGBA1C in the last 72 hours. Lipid Profile: No results for input(s): CHOL, HDL, LDLCALC, TRIG, CHOLHDL, LDLDIRECT in the last 72 hours. Thyroid function studies: No results for input(s): TSH, T4TOTAL, T3FREE, THYROIDAB in the last 72 hours.  Invalid input(s): FREET3 Anemia work up: Recent Labs    04/05/20 0520  VITAMINB12 2,792*  FOLATE 78.0   Sepsis Labs: Recent Labs  Lab 04/04/20 1439 04/05/20 0520 04/06/20 0337 04/07/20 0505  WBC 12.3* 11.4* 14.9* 12.7*    Microbiology Recent Results (from the past 240 hour(s))  SARS Coronavirus 2 by RT PCR (hospital order, performed in [facility] hospital lab) Nasopharyngeal Nasopharyngeal Swab     Status: None   Collection Time: 04/04/20 12:11 PM   Specimen: Nasopharyngeal Swab  Result Value Ref Range Status   SARS Coronavirus 2 NEGATIVE NEGATIVE Final    Comment: (NOTE) SARS-CoV-2 target nucleic acids  are NOT DETECTED.  The SARS-CoV-2 RNA is generally detectable in upper and lower respiratory specimens during the acute phase of infection. The lowest concentration of SARS-CoV-2 viral copies this assay can detect is 250 copies / mL. A negative result does not preclude SARS-CoV-2 infection and should not be used as the sole basis for treatment or other patient management decisions.  A negative result may occur with improper specimen collection / handling, submission of specimen other than nasopharyngeal swab, presence of viral mutation(s) within the areas targeted by this assay, and inadequate number of viral copies (<250 copies / mL). A negative result must be combined with clinical observations, patient history, and epidemiological information.  Fact Sheet for Patients:   BoilerBrush.com.cy  Fact Sheet for Healthcare Providers: https://pope.com/  This test is not yet approved or  cleared by the [place] FDA and has been authorized for detection and/or diagnosis of SARS-CoV-2 by FDA under an Emergency Use Authorization (EUA).  This EUA will remain in effect (meaning this test can be used)  for the duration of the COVID-19 declaration under Section 564(b)(1) of the Act, 21 U.S.C. section 360bbb-3(b)(1), unless the authorization is terminated or revoked sooner.  Performed at [facility], [address], [place], [place] [address]   Urine Culture     Status: Abnormal   Collection Time: 04/05/20  5:40 PM   Specimen: Urine, Random  Result Value Ref Range Status   Specimen Description   Final    URINE, RANDOM Performed at [facility], [address], [place], [place] [address]    Special Requests   Final    NONE Performed at [facility], [address] [place], [place] [address]    Culture (A)  Final    <10,000 COLONIES/mL INSIGNIFICANT GROWTH Performed at [facility], [address], [place], [place] [address]    Report Status 04/07/2020 FINAL  Final    Procedures and diagnostic studies:  CT HEAD WO CONTRAST  Result Date: 04/06/2020 CLINICAL DATA:  Mental status change, unknown cause. EXAM: CT HEAD WITHOUT CONTRAST TECHNIQUE: Contiguous axial images were obtained from the base of the skull through the vertex without intravenous contrast. COMPARISON:  No pertinent prior exams are available for comparison. FINDINGS: Brain: Mild generalized parenchymal atrophy. There is no acute intracranial hemorrhage. No demarcated cortical infarct is identified. No extra-axial fluid collection. No evidence of intracranial mass. No midline shift. Vascular: No hyperdense vessel. Skull: Normal. Negative for fracture or focal lesion. Sinuses/Orbits: Visualized orbits show no acute finding. No significant paranasal sinus disease or mastoid effusion at the imaged levels. IMPRESSION: No CT evidence of acute intracranial abnormality. Mild generalized parenchymal atrophy. Electronically Signed   By: Jackey Loge DO   On: 04/06/2020 09:55               LOS: 3 days   Zamzam Whinery  [facility]     04/07/2020, 2:19 PM

## 2020-04-07 NOTE — Plan of Care (Signed)
  Problem: Education: Goal: Knowledge of General Education information will improve Description: Including pain rating scale, medication(s)/side effects and non-pharmacologic comfort measures Outcome: Progressing   Problem: Nutrition: Goal: Adequate nutrition will be maintained Outcome: Progressing   

## 2020-04-08 LAB — COMPREHENSIVE METABOLIC PANEL
ALT: 83 U/L — ABNORMAL HIGH (ref 0–44)
AST: 238 U/L — ABNORMAL HIGH (ref 15–41)
Albumin: 2.4 g/dL — ABNORMAL LOW (ref 3.5–5.0)
Alkaline Phosphatase: 172 U/L — ABNORMAL HIGH (ref 38–126)
Anion gap: 11 (ref 5–15)
BUN: 40 mg/dL — ABNORMAL HIGH (ref 6–20)
CO2: 25 mmol/L (ref 22–32)
Calcium: 8.1 mg/dL — ABNORMAL LOW (ref 8.9–10.3)
Chloride: 95 mmol/L — ABNORMAL LOW (ref 98–111)
Creatinine, Ser: 0.91 mg/dL (ref 0.44–1.00)
GFR calc Af Amer: >60 mL/min (ref >60)
GFR calc non Af Amer: >60 mL/min (ref >60)
Glucose, Bld: 107 mg/dL — ABNORMAL HIGH (ref 70–99)
Potassium: 3.5 mmol/L (ref 3.5–5.1)
Sodium: 131 mmol/L — ABNORMAL LOW (ref 135–145)
Total Bilirubin: 15.6 mg/dL — ABNORMAL HIGH (ref 0.3–1.2)
Total Protein: 5.4 g/dL — ABNORMAL LOW (ref 6.5–8.1)

## 2020-04-08 LAB — CBC WITH DIFFERENTIAL/PLATELET
Abs Immature Granulocytes: 0.21 10*3/uL — ABNORMAL HIGH (ref 0.00–0.07)
Basophils Absolute: 0 10*3/uL (ref 0.0–0.1)
Basophils Relative: 0 %
Eosinophils Absolute: 0 10*3/uL (ref 0.0–0.5)
Eosinophils Relative: 0 %
HCT: 26.6 % — ABNORMAL LOW (ref 36.0–46.0)
Hemoglobin: 9.2 g/dL — ABNORMAL LOW (ref 12.0–15.0)
Immature Granulocytes: 1 %
Lymphocytes Relative: 13 %
Lymphs Abs: 2.3 10*3/uL (ref 0.7–4.0)
MCH: 34.6 pg — ABNORMAL HIGH (ref 26.0–34.0)
MCHC: 34.6 g/dL (ref 30.0–36.0)
MCV: 100 fL (ref 80.0–100.0)
Monocytes Absolute: 1.1 10*3/uL — ABNORMAL HIGH (ref 0.1–1.0)
Monocytes Relative: 6 %
Neutro Abs: 13.7 10*3/uL — ABNORMAL HIGH (ref 1.7–7.7)
Neutrophils Relative %: 80 %
Platelets: 142 10*3/uL — ABNORMAL LOW (ref 150–400)
RBC: 2.66 MIL/uL — ABNORMAL LOW (ref 3.87–5.11)
RDW: 19.7 % — ABNORMAL HIGH (ref 11.5–15.5)
WBC: 17.3 10*3/uL — ABNORMAL HIGH (ref 4.0–10.5)
nRBC: 0.1 % (ref 0.0–0.2)

## 2020-04-08 LAB — AMMONIA: Ammonia: 66 umol/L — ABNORMAL HIGH (ref 9–35)

## 2020-04-08 LAB — MAGNESIUM: Magnesium: 2.9 mg/dL — ABNORMAL HIGH (ref 1.7–2.4)

## 2020-04-08 LAB — PHOSPHORUS: Phosphorus: UNDETERMINED mg/dL (ref 2.5–4.6)

## 2020-04-08 MED ORDER — RIFAXIMIN 550 MG PO TABS
550.0000 mg | ORAL_TABLET | Freq: Two times a day (BID) | ORAL | Status: DC
  Administered 2020-04-08 – 2020-04-15 (×14): 550 mg via ORAL
  Filled 2020-04-08 (×16): qty 1

## 2020-04-08 MED ORDER — ALBUMIN HUMAN 5 % IV SOLN
25.0000 g | Freq: Every day | INTRAVENOUS | Status: AC
  Administered 2020-04-08 – 2020-04-12 (×5): 25 g via INTRAVENOUS
  Filled 2020-04-08 (×5): qty 500

## 2020-04-08 NOTE — Progress Notes (Signed)
Progress Note    Jill Reyes  UJW:[id number] DOB: August 15, 1976  DOA: 04/04/2020 PCP: Patient, No Pcp Per      Brief Narrative:    Medical records reviewed and are as summarized below:  Jill Reyes is a 44 y.o. 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  Bilirubin level continues to trend down.  Ammonia level 66 on 04/08/2020.  CT head on 04/06/2020 did not show any acute abnormality..  Continue lactulose and prednisone.  Follow-up with gastroenterologist.  Severe anemia, reported history of recent melena: Hemoglobin was 12.6 on 01/25/2020.  S/p transfusion with 1 unit of packed red blood cells on 04/05/2020. Stool for occult blood is pending.  Continue IV Protonix.  Monitor H&H.  Hyponatremia: Slowly improving.  Monitor sodium level.  Hypokalemia: Improved continue to monitor.  Hypotension: BP is improved.  Acute kidney injury: Creatinine is improving.  Continue IV fluids.   Abnormal urinalysis, questionable acute UTI, leukocytosis: Insignificant growth on urine culture.  She has received 3 days worth of IV Rocephin which was completed on 04/07/2020.  Thrombocytopenia: This is likely from liver disease  Alcohol use disorder: Continue thiamine, folic acid and Ativan as needed.  Prognosis is poor.   Body mass index is 17.72 kg/m.  (Underweight)/severe protein calorie malnutrition   Diet Order            Diet 2 gram sodium Room service appropriate? Yes; Fluid consistency: Thin  Diet effective now                       Medications:   . Chlorhexidine Gluconate Cloth  6 each Topical Daily  . feeding supplement (ENSURE ENLIVE)  237 mL Oral TID BM   . folic acid  1 mg Oral Daily  . lactulose  30 g Oral TID  . LORazepam  0-4 mg Intravenous Q8H  . multivitamin with minerals  1 tablet Oral Daily  . nicotine  21 mg Transdermal Daily  . pantoprazole  40 mg Intravenous Q12H  . predniSONE  40 mg Oral Q breakfast  . sodium chloride flush  3 mL Intravenous Once  . thiamine  100 mg Oral Daily   Or  . thiamine  100 mg Intravenous Daily   Continuous Infusions: . sodium chloride 75 mL/hr at 04/08/20 0338     Anti-infectives (From admission, onward)   Start     Dose/Rate Route Frequency Ordered Stop   04/04/20 1600  cefTRIAXone (ROCEPHIN) 2 g in sodium chloride 0.9 % 100 mL IVPB  Status:  Discontinued        2 g 200 mL/hr over 30 Minutes Intravenous Every 24 hours 04/04/20 1558 04/07/20 1418             Family Communication/Anticipated D/C date and plan/Code Status   DVT prophylaxis: SCDs Start: 04/04/20 1350     Code Status: Full Code  Family Communication: Plan discussed with husband at the bedside Disposition Plan:    Status is: Inpatient  Remains inpatient appropriate because:IV treatments appropriate due to intensity of illness or inability to take PO   Dispo: The patient is from: Home  Anticipated d/c is to: Home              Anticipated d/c date is: > 3 days              Patient currently is not medically stable to d/c.           Subjective:   Chart review showed that she had one large bowel movement yesterday.  Her mental status is better today.  She is more communicative compared to yesterday but she is still confused.   Objective:    Vitals:   04/07/20 1946 04/08/20 0328 04/08/20 0752 04/08/20 1142  BP: 113/71 98/69 104/80 100/67  Pulse: (!) 114 (!) 112 (!) 109 76  Resp: 19 19 18 18   Temp: (!) 97.5 F (36.4 C) 97.9 F (36.6 C) 97.7 F (36.5 C) (!) 97.5 F (36.4 C)  TempSrc: Oral Oral Oral Oral  SpO2: 97% 97% 97% 98%  Weight:  52.9 kg    Height:       No data  found.  No intake or output data in the 24 hours ending 04/08/20 1146 Filed Weights   04/05/20 0502 04/06/20 0451 04/08/20 0328  Weight: 51.3 kg 52.2 kg 52.9 kg    Exam:  GEN: NAD SKIN: Jaundice, spider angioma on the chest and face EYES: icteric ENT: MMM CV: Tachycardic PULM: Clear to auscultation bilaterally ABD: soft, mildly distended, NT, +BS CNS: Alert, oriented to person and place.  She is able to follow simple commands.  She has flapping tremors of bilateral hands. EXT: No edema or tenderness   Data Reviewed:   I have personally reviewed following labs and imaging studies:  Labs: Labs show the following:   Basic Metabolic Panel: Recent Labs  Lab 04/04/20 0805 04/04/20 1439 04/05/20 0520 04/05/20 0520 04/05/20 1808 04/05/20 1808 04/06/20 06/06/20 04/06/20 0337 04/07/20 0505 04/08/20 0650  NA   < >  --  123*  --  127*  --  129*  --  131* 131*  K   < >  --  3.7   < > 4.0   < > 3.9   < > 3.7 3.5  CL   < >  --  80*  --  84*  --  88*  --  93* 95*  CO2   < >  --  29  --  29  --  27  --  29 25  GLUCOSE   < >  --  121*  --  133*  --  127*  --  96 107*  BUN   < >  --  58*  --  56*  --  52*  --  45* 40*  CREATININE   < >  --  1.51*  --  1.32*  --  1.14*  --  1.17* 0.91  CALCIUM   < >  --  7.4*  --  7.6*  --  7.6*  --  7.9* 8.1*  MG  --  2.6*  --   --   --   --  2.7*  --   --  2.9*  PHOS  --  UNABLE TO REPORT DUE TO ICTERUS  --   --   --   --  UNABLE TO REPORT DUE TO ICTERUS  --   --  UNABLE TO REPORT DUE TO ICTERUS   < > = values in this interval not displayed.   GFR Estimated Creatinine Clearance: 67.3 mL/min (by C-G formula based on SCr  of 0.91 mg/dL). Liver Function Tests: Recent Labs  Lab 04/04/20 0805 04/06/20 0337 04/07/20 0505 04/08/20 0650  AST 174* 225* 228* 238*  ALT 50* 60* 67* 83*  ALKPHOS 146* 143* 131* 172*  BILITOT 20.1* 17.8* 16.0* 15.6*  PROT 5.7* 5.3* 5.0* 5.4*  ALBUMIN 2.5* 2.3* 2.1* 2.4*   Recent Labs  Lab 04/04/20 0805  LIPASE 49    Recent Labs  Lab 04/04/20 1054 04/08/20 0650  AMMONIA RESULTS UNAVAILABLE DUE TO INTERFERING SUBSTANCE 66*   Coagulation profile Recent Labs  Lab 04/04/20 1036 04/06/20 0337 04/07/20 0505  INR 1.3* 1.4* 1.4*    CBC: Recent Labs  Lab 04/04/20 1439 04/04/20 1439 04/05/20 0520 04/05/20 1808 04/06/20 0337 04/07/20 0505 04/08/20 0650  WBC 12.3*  --  11.4*  --  14.9* 12.7* 17.3*  NEUTROABS  --   --   --   --  12.5* 9.4* 13.7*  HGB 7.6*   < > 7.0* 8.6* 8.5* 8.2* 9.2*  HCT 20.5*   < > 18.9* 24.5* 24.4* 23.7* 26.6*  MCV 99.5  --  99.5  --  100.0 101.7* 100.0  PLT 118*  --  124*  --  141* 125* 142*   < > = values in this interval not displayed.   Cardiac Enzymes: No results for input(s): CKTOTAL, CKMB, CKMBINDEX, TROPONINI in the last 168 hours. BNP (last 3 results) No results for input(s): PROBNP in the last 8760 hours. CBG: No results for input(s): GLUCAP in the last 168 hours. D-Dimer: No results for input(s): DDIMER in the last 72 hours. Hgb A1c: No results for input(s): HGBA1C in the last 72 hours. Lipid Profile: No results for input(s): CHOL, HDL, LDLCALC, TRIG, CHOLHDL, LDLDIRECT in the last 72 hours. Thyroid function studies: No results for input(s): TSH, T4TOTAL, T3FREE, THYROIDAB in the last 72 hours.  Invalid input(s): FREET3 Anemia work up: No results for input(s): VITAMINB12, FOLATE, FERRITIN, TIBC, IRON, RETICCTPCT in the last 72 hours. Sepsis Labs: Recent Labs  Lab 04/05/20 0520 04/06/20 0337 04/07/20 0505 04/08/20 0650  WBC 11.4* 14.9* 12.7* 17.3*    Microbiology Recent Results (from the past 240 hour(s))  SARS Coronavirus 2 by RT PCR (hospital order, performed in [facility] hospital lab) Nasopharyngeal Nasopharyngeal Swab     Status: None   Collection Time: 04/04/20 12:11 PM   Specimen: Nasopharyngeal Swab  Result Value Ref Range Status   SARS Coronavirus 2 NEGATIVE NEGATIVE Final    Comment: (NOTE) SARS-CoV-2 target nucleic acids are  NOT DETECTED.  The SARS-CoV-2 RNA is generally detectable in upper and lower respiratory specimens during the acute phase of infection. The lowest concentration of SARS-CoV-2 viral copies this assay can detect is 250 copies / mL. A negative result does not preclude SARS-CoV-2 infection and should not be used as the sole basis for treatment or other patient management decisions.  A negative result may occur with improper specimen collection / handling, submission of specimen other than nasopharyngeal swab, presence of viral mutation(s) within the areas targeted by this assay, and inadequate number of viral copies (<250 copies / mL). A negative result must be combined with clinical observations, patient history, and epidemiological information.  Fact Sheet for Patients:   [employer]iler[employer]com.cy  Fact Sheet for Healthcare Providers: https://pope.com/  This test is not yet approved or  cleared by the [place] FDA and has been authorized for detection and/or diagnosis of SARS-CoV-2 by FDA under an Emergency Use Authorization (EUA).  This EUA will remain in effect (meaning  this test can be used) for the duration of the COVID-19 declaration under Section 564(b)(1) of the Act, 21 U.S.C. section 360bbb-3(b)(1), unless the authorization is terminated or revoked sooner.  Performed at [facility], [address] [place], [place] [address]   Urine Culture     Status: Abnormal   Collection Time: 04/05/20  5:40 PM   Specimen: Urine, Random  Result Value Ref Range Status   Specimen Description   Final    URINE, RANDOM Performed at [facility], [address] [place], [place] [address]    Special Requests   Final    NONE Performed at [facility], [address] [place], [place] [address]    Culture (A)  Final    <10,000 COLONIES/mL INSIGNIFICANT GROWTH Performed at [facility], [address]  [place], [place] [address]    Report Status 04/07/2020 FINAL  Final    Procedures and diagnostic studies:  No results found.             LOS: 4 days   Demarrio Menges  [facility]     04/08/2020, 11:46 AM

## 2020-04-08 NOTE — Progress Notes (Signed)
Arlyss Repress, MD [address]  [address]  [place], [place] [address]  Main: [phone]  Fax: [phone] Pager: [phone]   Subjective: Slightly more lethargic per her husband who is bedside. Didn't eat today. No bm today. Taking lactulose   Objective: Vital signs in last 24 hours: Vitals:   04/07/20 1946 04/08/20 0328 04/08/20 0752 04/08/20 1142  BP: 113/71 98/69 104/80 100/67  Pulse: (!) 114 (!) 112 (!) 109 76  Resp: 19 19 18 18   Temp: (!) 97.5 F (36.4 C) 97.9 F (36.6 C) 97.7 F (36.5 C) (!) 97.5 F (36.4 C)  TempSrc: Oral Oral Oral Oral  SpO2: 97% 97% 97% 98%  Weight:  52.9 kg    Height:       Weight change:   Intake/Output Summary (Last 24 hours) at 04/08/2020 1449 Last data filed at 04/08/2020 1217 Gross per 24 hour  Intake 1600.01 ml  Output --  Net 1600.01 ml     Exam: Heart:: tachycardic Lungs: normal and clear to auscultation Abdomen: Diffusely distended, tympanic to percussion, mild epigastric tenderness   Lab Results: CBC Latest Ref Rng & Units 04/08/2020 04/07/2020 04/06/2020  WBC 4.0 - 10.5 K/uL 17.3(H) 12.7(H) 14.9(H)  Hemoglobin 12.0 - 15.0 g/dL 06/06/2020) 5.4(Y) 5.0(P)  Hematocrit 36 - 46 % 26.6(L) 23.7(L) 24.4(L)  Platelets 150 - 400 K/uL 142(L) 125(L) 141(L)   CMP Latest Ref Rng & Units 04/08/2020 04/07/2020 04/06/2020  Glucose 70 - 99 mg/dL 06/06/2020) 96 568(L)  BUN 6 - 20 mg/dL 275(T) 70(Y) 17(C)  Creatinine 0.44 - 1.00 mg/dL 94(W 9.67) 5.91(M)  Sodium 135 - 145 mmol/L 131(L) 131(L) 129(L)  Potassium 3.5 - 5.1 mmol/L 3.5 3.7 3.9  Chloride 98 - 111 mmol/L 95(L) 93(L) 88(L)  CO2 22 - 32 mmol/L 25 29 27   Calcium 8.9 - 10.3 mg/dL 8.1(L) 7.9(L) 7.6(L)  Total Protein 6.5 - 8.1 g/dL 3.84(Y) ) 5.3(L)  Total Bilirubin 0.3 - 1.2 mg/dL 15.6(H) 16.0(H) 17.8(H)  Alkaline Phos 38 - 126 U/L 172(H) 131(H) 143(H)  AST 15 - 41 U/L 238(H) 228(H) 225(H)  ALT 0 - 44 U/L 83(H) 67(H) 60(H)    Micro Results: Recent Results (from the past 240  hour(s))  SARS Coronavirus 2 by RT PCR (hospital order, performed in [facility] hospital lab) Nasopharyngeal Nasopharyngeal Swab     Status: None   Collection Time: 04/04/20 12:11 PM   Specimen: Nasopharyngeal Swab  Result Value Ref Range Status   SARS Coronavirus 2 NEGATIVE NEGATIVE Final    Comment: (NOTE) SARS-CoV-2 target nucleic acids are NOT DETECTED.  The SARS-CoV-2 RNA is generally detectable in upper and lower respiratory specimens during the acute phase of infection. The lowest concentration of SARS-CoV-2 viral copies this assay can detect is 250 copies / mL. A negative result does not preclude SARS-CoV-2 infection and should not be used as the sole basis for treatment or other patient management decisions.  A negative result may occur with improper specimen collection / handling, submission of specimen other than nasopharyngeal swab, presence of viral mutation(s) within the areas targeted by this assay, and inadequate number of viral copies (<250 copies / mL). A negative result must be combined with clinical observations, patient history, and epidemiological information.  Fact Sheet for Patients:   [facility]  Fact Sheet for Healthcare Providers: 06/04/20  This test is not yet approved or  cleared by the [employer] FDA and has been authorized for detection and/or diagnosis of SARS-CoV-2 by FDA under an  Emergency Use Authorization (EUA).  This EUA will remain in effect (meaning this test can be used) for the duration of the COVID-19 declaration under Section 564(b)(1) of the Act, 21 U.S.C. section 360bbb-3(b)(1), unless the authorization is terminated or revoked sooner.  Performed at [facility], [address], [place], [place] [address]   Urine Culture     Status: Abnormal   Collection Time: 04/05/20  5:40 PM   Specimen: Urine, Random  Result Value Ref Range Status   Specimen  Description   Final    URINE, RANDOM Performed at [facility], [address], [place], [place] [address]    Special Requests   Final    NONE Performed at [facility], [address] [place], [place] [address]    Culture (A)  Final    <10,000 COLONIES/mL INSIGNIFICANT GROWTH Performed at [facility], [address] [place], [place] [address]    Report Status 04/07/2020 FINAL  Final   Studies/Results: No results found. Medications:  I have reviewed the patient's current medications. Prior to Admission:  Medications Prior to Admission  Medication Sig Dispense Refill Last Dose  . loratadine (CLARITIN) 10 MG tablet Take 1 tablet (10 mg total) by mouth daily. 30 tablet 1 Past Week at PRN  . nicotine (NICODERM CQ - DOSED IN MG/24 HOURS) 21 mg/24hr patch Place 1 patch (21 mg total) onto the skin daily. 28 patch 0 04/03/2020 at 0800   Scheduled: . Chlorhexidine Gluconate Cloth  6 each Topical Daily  . feeding supplement (ENSURE ENLIVE)  237 mL Oral TID BM  . folic acid  1 mg Oral Daily  . lactulose  30 g Oral TID  . multivitamin with minerals  1 tablet Oral Daily  . nicotine  21 mg Transdermal Daily  . pantoprazole  40 mg Intravenous Q12H  . predniSONE  40 mg Oral Q breakfast  . rifaximin  550 mg Oral BID  . sodium chloride flush  3 mL Intravenous Once  . thiamine  100 mg Oral Daily   Or  . thiamine  100 mg Intravenous Daily   Continuous: . sodium chloride 75 mL/hr at 04/08/20 1217  . albumin human     DZH:[id number] **OR** ondansetron (ZOFRAN) IV, oxyCODONE, sodium chloride flush Anti-infectives (From admission, onward)   Start     Dose/Rate Route Frequency Ordered Stop   04/08/20 1600  rifaximin (XIFAXAN) tablet 550 mg     Discontinue     550 mg Oral 2 times daily 04/08/20 1448     04/04/20 1600  cefTRIAXone (ROCEPHIN) 2 g in sodium chloride 0.9 % 100 mL IVPB  Status:  Discontinued        2 g 200 mL/hr over 30 Minutes Intravenous Every 24  hours 04/04/20 1558 04/07/20 1418     Scheduled Meds: . Chlorhexidine Gluconate Cloth  6 each Topical Daily  . feeding supplement (ENSURE ENLIVE)  237 mL Oral TID BM  . folic acid  1 mg Oral Daily  . lactulose  30 g Oral TID  . multivitamin with minerals  1 tablet Oral Daily  . nicotine  21 mg Transdermal Daily  . pantoprazole  40 mg Intravenous Q12H  . predniSONE  40 mg Oral Q breakfast  . rifaximin  550 mg Oral BID  . sodium chloride flush  3 mL Intravenous Once  . thiamine  100 mg Oral Daily   Or  . thiamine  100 mg Intravenous Daily   Continuous  Infusions: . sodium chloride 75 mL/hr at 04/08/20 1217  . albumin human     PRN Meds:.ondansetron **OR** ondansetron (ZOFRAN) IV, oxyCODONE, sodium chloride flush   Assessment: Principal Problem:   Acute alcoholic liver disease Active Problems:   Hypokalemia   Hyponatremia   Nicotine dependence   AKI (acute kidney injury) (HCC)   Anemia  44 year old female admitted with alcoholic hepatitis, commenced on prednisone,   Plan: Alcoholic hepatitis: No sign of infection, leukocytosis is secondary to steroid use Prednisone day 5, will calculate Lille score on day 7 to decide whether or not she should continue 28 days of prednisone Monitor LFTs daily, total bilirubin slowly improving PSE: Continue lactulose, recommend to add rifaximin 550 mg twice daily Complete abstinence from alcohol use Continue multivitamin plus thiamine plus folic acid daily Minimize administration of opioid use for pain   AKI Patient has tachycardia, appears dehydrated, elevated BUN Recommend maintenance Albumin infusion daily for 3 days Hyponatremia is improving, on normal saline 75 ml/hour Strict ins and outs    LOS: 4 days   Hanzel Pizzo 04/08/2020, 2:49 PM

## 2020-04-09 ENCOUNTER — Inpatient Hospital Stay: Payer: Medicaid Other

## 2020-04-09 LAB — CBC WITH DIFFERENTIAL/PLATELET
Abs Immature Granulocytes: 0.13 10*3/uL — ABNORMAL HIGH (ref 0.00–0.07)
Basophils Absolute: 0 10*3/uL (ref 0.0–0.1)
Basophils Relative: 0 %
Eosinophils Absolute: 0 10*3/uL (ref 0.0–0.5)
Eosinophils Relative: 0 %
HCT: 25.4 % — ABNORMAL LOW (ref 36.0–46.0)
Hemoglobin: 8.5 g/dL — ABNORMAL LOW (ref 12.0–15.0)
Immature Granulocytes: 1 %
Lymphocytes Relative: 8 %
Lymphs Abs: 1.3 10*3/uL (ref 0.7–4.0)
MCH: 34.8 pg — ABNORMAL HIGH (ref 26.0–34.0)
MCHC: 33.5 g/dL (ref 30.0–36.0)
MCV: 104.1 fL — ABNORMAL HIGH (ref 80.0–100.0)
Monocytes Absolute: 1.1 10*3/uL — ABNORMAL HIGH (ref 0.1–1.0)
Monocytes Relative: 7 %
Neutro Abs: 13 10*3/uL — ABNORMAL HIGH (ref 1.7–7.7)
Neutrophils Relative %: 84 %
Platelets: 122 10*3/uL — ABNORMAL LOW (ref 150–400)
RBC: 2.44 MIL/uL — ABNORMAL LOW (ref 3.87–5.11)
RDW: 19.5 % — ABNORMAL HIGH (ref 11.5–15.5)
WBC: 15.5 10*3/uL — ABNORMAL HIGH (ref 4.0–10.5)
nRBC: 0 % (ref 0.0–0.2)

## 2020-04-09 LAB — COMPREHENSIVE METABOLIC PANEL
ALT: 80 U/L — ABNORMAL HIGH (ref 0–44)
AST: 199 U/L — ABNORMAL HIGH (ref 15–41)
Albumin: 2.9 g/dL — ABNORMAL LOW (ref 3.5–5.0)
Alkaline Phosphatase: 159 U/L — ABNORMAL HIGH (ref 38–126)
Anion gap: 12 (ref 5–15)
BUN: 33 mg/dL — ABNORMAL HIGH (ref 6–20)
CO2: 23 mmol/L (ref 22–32)
Calcium: 8.6 mg/dL — ABNORMAL LOW (ref 8.9–10.3)
Chloride: 98 mmol/L (ref 98–111)
Creatinine, Ser: 0.87 mg/dL (ref 0.44–1.00)
GFR calc Af Amer: >60 mL/min (ref >60)
GFR calc non Af Amer: >60 mL/min (ref >60)
Glucose, Bld: 119 mg/dL — ABNORMAL HIGH (ref 70–99)
Potassium: 3.2 mmol/L — ABNORMAL LOW (ref 3.5–5.1)
Sodium: 133 mmol/L — ABNORMAL LOW (ref 135–145)
Total Bilirubin: 12.9 mg/dL — ABNORMAL HIGH (ref 0.3–1.2)
Total Protein: 5.7 g/dL — ABNORMAL LOW (ref 6.5–8.1)

## 2020-04-09 LAB — PHOSPHORUS: Phosphorus: UNDETERMINED mg/dL (ref 2.5–4.6)

## 2020-04-09 LAB — AMMONIA: Ammonia: 67 umol/L — ABNORMAL HIGH (ref 9–35)

## 2020-04-09 LAB — MAGNESIUM: Magnesium: 3 mg/dL — ABNORMAL HIGH (ref 1.7–2.4)

## 2020-04-09 MED ORDER — SODIUM CHLORIDE 0.9 % IV SOLN
3.0000 g | Freq: Four times a day (QID) | INTRAVENOUS | Status: AC
  Administered 2020-04-09 – 2020-04-14 (×19): 3 g via INTRAVENOUS
  Filled 2020-04-09: qty 3
  Filled 2020-04-09: qty 8
  Filled 2020-04-09 (×3): qty 3
  Filled 2020-04-09 (×2): qty 8
  Filled 2020-04-09 (×3): qty 3
  Filled 2020-04-09: qty 8
  Filled 2020-04-09 (×6): qty 3
  Filled 2020-04-09: qty 8
  Filled 2020-04-09: qty 3
  Filled 2020-04-09: qty 8

## 2020-04-09 MED ORDER — POTASSIUM CHLORIDE CRYS ER 20 MEQ PO TBCR
40.0000 meq | EXTENDED_RELEASE_TABLET | ORAL | Status: AC
  Administered 2020-04-09 (×2): 40 meq via ORAL
  Filled 2020-04-09 (×2): qty 2

## 2020-04-09 NOTE — Progress Notes (Signed)
Pt took am meds this am whole with water. Pt was sitting upright and taking them one at a time. Pt began to choke and appeared to have increased nervousness. This nurse took a set of vitals, o2 probe was placed on her finger and a reading of 78%, this nurse placed pt on [place] 4l, and O2 increased to 90%. Pt could not handle the [place], and this nurse then called RT, pt placed on venti mask at 35% 6L, O2 at 92%. Pt is also tachy at 126 bpm, and resp at 24, Bp 118/81 Dr was made aware and a stat chest xray was ordered, results are consistent aspiration pna. Will ctm

## 2020-04-09 NOTE — Progress Notes (Signed)
Arlyss Repress, MD [address]  [address]  [place], [place] [address]  Main: [phone]  Fax: [phone] Pager: [phone]   Subjective: Slightly more lethargic per her husband who is bedside. Didn't eat today.  Had a 3 bms yesterday, 1 bowel movement today. Taking lactulose   Objective: Vital signs in last 24 hours: Vitals:   04/08/20 2000 04/09/20 0500 04/09/20 0745 04/09/20 1230  BP:   123/87 116/78  Pulse:   (!) 110 (!) 120  Resp: 17  18 19   Temp:   98.3 F (36.8 C) 98.1 F (36.7 C)  TempSrc:   Oral Axillary  SpO2:   95% 98%  Weight:  52 kg    Height:       Weight change: -0.862 kg  Intake/Output Summary (Last 24 hours) at 04/09/2020 1449 Last data filed at 04/09/2020 1145 Gross per 24 hour  Intake 1116.86 ml  Output 0 ml  Net 1116.86 ml     Exam: Heart:: tachycardic Lungs: normal and clear to auscultation Abdomen: Diffusely distended, tympanic to percussion, mild epigastric tenderness   Lab Results: CBC Latest Ref Rng & Units 04/09/2020 04/08/2020 04/07/2020  WBC 4.0 - 10.5 K/uL 15.5(H) 17.3(H) 12.7(H)  Hemoglobin 12.0 - 15.0 g/dL 06/07/2020) 9.6(E) 9.5(M)  Hematocrit 36 - 46 % 25.4(L) 26.6(L) 23.7(L)  Platelets 150 - 400 K/uL 122(L) 142(L) 125(L)   CMP Latest Ref Rng & Units 04/09/2020 04/08/2020 04/07/2020  Glucose 70 - 99 mg/dL 06/07/2020) 324(M) 96  BUN 6 - 20 mg/dL 010(U) 72(Z) 36(U)  Creatinine 0.44 - 1.00 mg/dL 44(I 3.47 4.25)  Sodium 135 - 145 mmol/L 133(L) 131(L) 131(L)  Potassium 3.5 - 5.1 mmol/L 3.2(L) 3.5 3.7  Chloride 98 - 111 mmol/L 98 95(L) 93(L)  CO2 22 - 32 mmol/L 23 25 29   Calcium 8.9 - 10.3 mg/dL 9.56(L) 8.1(L) 7.9(L)  Total Protein 6.5 - 8.1 g/dL ) 8.7(F) 6.4(P)  Total Bilirubin 0.3 - 1.2 mg/dL 12.9(H) 15.6(H) 16.0(H)  Alkaline Phos 38 - 126 U/L 159(H) 172(H) 131(H)  AST 15 - 41 U/L 199(H) 238(H) 228(H)  ALT 0 - 44 U/L 80(H) 83(H) 67(H)    Micro Results: Recent Results (from the past 240 hour(s))  SARS Coronavirus 2 by RT  PCR (hospital order, performed in [facility] hospital lab) Nasopharyngeal Nasopharyngeal Swab     Status: None   Collection Time: 04/04/20 12:11 PM   Specimen: Nasopharyngeal Swab  Result Value Ref Range Status   SARS Coronavirus 2 NEGATIVE NEGATIVE Final    Comment: (NOTE) SARS-CoV-2 target nucleic acids are NOT DETECTED.  The SARS-CoV-2 RNA is generally detectable in upper and lower respiratory specimens during the acute phase of infection. The lowest concentration of SARS-CoV-2 viral copies this assay can detect is 250 copies / mL. A negative result does not preclude SARS-CoV-2 infection and should not be used as the sole basis for treatment or other patient management decisions.  A negative result may occur with improper specimen collection / handling, submission of specimen other than nasopharyngeal swab, presence of viral mutation(s) within the areas targeted by this assay, and inadequate number of viral copies (<250 copies / mL). A negative result must be combined with clinical observations, patient history, and epidemiological information.  Fact Sheet for Patients:   [facility]  Fact Sheet for Healthcare Providers: 06/04/20  This test is not yet approved or  cleared by the [employer] FDA and has been authorized for detection and/or diagnosis of SARS-CoV-2 by FDA under an  Emergency Use Authorization (EUA).  This EUA will remain in effect (meaning this test can be used) for the duration of the COVID-19 declaration under Section 564(b)(1) of the Act, 21 U.S.C. section 360bbb-3(b)(1), unless the authorization is terminated or revoked sooner.  Performed at [facility], [address], [place], [place] [address]   Urine Culture     Status: Abnormal   Collection Time: 04/05/20  5:40 PM   Specimen: Urine, Random  Result Value Ref Range Status   Specimen Description   Final    URINE,  RANDOM Performed at [facility], [address] [place], [place] [address]    Special Requests   Final    NONE Performed at [facility], [address] [place], [place] [address]    Culture (A)  Final    <10,000 COLONIES/mL INSIGNIFICANT GROWTH Performed at [facility], [address], [place], [place] [address]    Report Status 04/07/2020 FINAL  Final   Studies/Results: DG Chest Port 1 View  Result Date: 04/09/2020 CLINICAL DATA:  Aspiration EXAM: PORTABLE CHEST 1 VIEW COMPARISON:  April 04, 2020 FINDINGS: There are areas of airspace opacity in the lung bases with associated atelectasis in the right base. Heart size and pulmonary vascularity are normal. No adenopathy. No bone lesions. IMPRESSION: Bibasilar airspace opacity consistent with either pneumonia or aspiration. Both entities may be present concurrently. There is stable right base atelectasis as well. Heart size normal. Electronically Signed   By: Bretta Bang III M.D.   On: 04/09/2020 11:20   Medications:  I have reviewed the patient's current medications. Prior to Admission:  Medications Prior to Admission  Medication Sig Dispense Refill Last Dose  . loratadine (CLARITIN) 10 MG tablet Take 1 tablet (10 mg total) by mouth daily. 30 tablet 1 Past Week at PRN  . nicotine (NICODERM CQ - DOSED IN MG/24 HOURS) 21 mg/24hr patch Place 1 patch (21 mg total) onto the skin daily. 28 patch 0 04/03/2020 at 0800   Scheduled: . Chlorhexidine Gluconate Cloth  6 each Topical Daily  . feeding supplement (ENSURE ENLIVE)  237 mL Oral TID BM  . folic acid  1 mg Oral Daily  . lactulose  30 g Oral TID  . multivitamin with minerals  1 tablet Oral Daily  . nicotine  21 mg Transdermal Daily  . pantoprazole  40 mg Intravenous Q12H  . potassium chloride  40 mEq Oral Q4H  . predniSONE  40 mg Oral Q breakfast  . rifaximin  550 mg Oral BID  . sodium chloride flush  3 mL Intravenous Once  . thiamine  100 mg Oral  Daily   Or  . thiamine  100 mg Intravenous Daily   Continuous: . albumin human 25 g (04/09/20 0959)   FMB:[id number] **OR** ondansetron (ZOFRAN) IV, oxyCODONE, sodium chloride flush Anti-infectives (From admission, onward)   Start     Dose/Rate Route Frequency Ordered Stop   04/08/20 1600  rifaximin (XIFAXAN) tablet 550 mg     Discontinue     550 mg Oral 2 times daily 04/08/20 1448     04/04/20 1600  cefTRIAXone (ROCEPHIN) 2 g in sodium chloride 0.9 % 100 mL IVPB  Status:  Discontinued        2 g 200 mL/hr over 30 Minutes Intravenous Every 24 hours 04/04/20 1558 04/07/20 1418     Scheduled Meds: . Chlorhexidine Gluconate Cloth  6 each Topical Daily  . feeding supplement (ENSURE ENLIVE)  237  mL Oral TID BM  . folic acid  1 mg Oral Daily  . lactulose  30 g Oral TID  . multivitamin with minerals  1 tablet Oral Daily  . nicotine  21 mg Transdermal Daily  . pantoprazole  40 mg Intravenous Q12H  . potassium chloride  40 mEq Oral Q4H  . predniSONE  40 mg Oral Q breakfast  . rifaximin  550 mg Oral BID  . sodium chloride flush  3 mL Intravenous Once  . thiamine  100 mg Oral Daily   Or  . thiamine  100 mg Intravenous Daily   Continuous Infusions: . albumin human 25 g (04/09/20 0959)   PRN Meds:.ondansetron **OR** ondansetron (ZOFRAN) IV, oxyCODONE, sodium chloride flush   Assessment: Principal Problem:   Acute alcoholic liver disease Active Problems:   Hypokalemia   Hyponatremia   Nicotine dependence   AKI (acute kidney injury) (HCC)   Anemia  44 year old female admitted with alcoholic hepatitis, commenced on prednisone,   Plan: Alcoholic hepatitis:  Prednisone commenced on 8/5, will calculate Lille score on day 7 to decide whether or not she should continue 28 days of prednisone Monitor LFTs daily, total bilirubin slowly improving PSE: Continue lactulose and rifaximin 550 mg Complete abstinence from alcohol use Continue multivitamin plus thiamine plus folic acid  daily Minimize administration of opioid use for pain Low-sodium diet, recommend nutrition consult for counseling  AKI: Gradually improving Patient has tachycardia, appears dehydrated, elevated BUN Recommend maintenance Albumin infusion daily for 3 days Hyponatremia is improving, on normal saline 75 ml/hour Strict ins and outs  Pneumonia, likely secondary to aspiration Low threshold to start antibiotics as patient is on prednisone, will defer to primary team    LOS: 5 days   Yaremi Stahlman 04/09/2020, 2:49 PM

## 2020-04-09 NOTE — Progress Notes (Addendum)
Progress Note    Jill Reyes  BLT:[id number] DOB: 08/06/1976  DOA: 04/04/2020 PCP: Patient, No Pcp Per      Brief Narrative:    Medical records reviewed and are as summarized below:  Jill Reyes is a 44 y.o. female with medical history significant for alcohol abuse who presented to the ER for evaluation of weakness which has progressively worsened over the last several weeks. Patient states that she was drinking a few bottle of vodka daily but quit about 2 weeks prior to her admission.  She also complained of nausea, vomiting, anorexia, abdominal distention and yellowish discoloration of her skin and eyes.  Work-up revealed acute alcoholic hepatitis probable underlying alcoholic liver cirrhosis.  She was confused and this was thought to be due to hepatic encephalopathy.  She was treated with prednisone, lactulose and rifaximin.  She also had acute kidney injury and hyponatremia that were treated with IV fluids.  She was treated with empiric IV Rocephin for abnormal urinalysis, suspected UTI.  Urine culture did not show any growth.  She was transfused with 1 unit of packed red blood cells for severe anemia.  She had hypokalemia that was corrected.  Hospital course was complicated by aspiration pneumonia and acute hypoxemic respiratory failure which occurred after patient choked/aspirated while taking lactulose.  She was started on IV Unasyn for aspiration pneumonia.  She required oxygen therapy briefly for acute hypoxemic respiratory failure.      Assessment/Plan:   Principal Problem:   Acute alcoholic liver disease Active Problems:   Hypokalemia   Hyponatremia   Nicotine dependence   AKI (acute kidney injury) (HCC)   Anemia    Acute alcoholic hepatitis, probable alcoholic liver cirrhosis with hepatic encephalopathy, coagulopathy, hypoalbuminemia: Mental status is slowly improving.  Bilirubin level continues to trend down.  Ammonia level 66 on 04/08/2020.  CT head on  04/06/2020 did not show any acute abnormality..  Continue lactulose and prednisone.  Rifaximin and IV albumin have been added.  Follow-up with gastroenterologist.  Severe anemia, reported history of recent melena: Hemoglobin was 12.6 on 01/25/2020.  S/p transfusion with 1 unit of packed red blood cells on 04/05/2020.  H&H has remained stable since transfusion..  Continue IV Protonix.  Monitor H&H.  Aspiration pneumonia, acute hypoxemic respiratory failure: Start IV Unasyn.  Oxygenation has improved and she's tolerating room air.  Hyponatremia: Slowly improving.  Monitor sodium level.  Hypokalemia: Replete potassium and monitor levels.  Hypotension: BP is improved.  Acute kidney injury: Creatinine has improved.  Discontinue IV fluids.  Abnormal urinalysis, questionable acute UTI, leukocytosis: Insignificant growth on urine culture.  She has received 3 days worth of IV Rocephin which was completed on 04/07/2020.  Thrombocytopenia: This is likely from liver disease  Alcohol use disorder: Continue thiamine, folic acid and Ativan as needed.  Prognosis is poor.   Body mass index is 17.43 kg/m.  (Underweight)/severe protein calorie malnutrition   Diet Order            Diet 2 gram sodium Room service appropriate? Yes; Fluid consistency: Thin  Diet effective now                       Medications:   . Chlorhexidine Gluconate Cloth  6 each Topical Daily  . feeding supplement (ENSURE ENLIVE)  237 mL Oral TID BM  . folic acid  1 mg Oral Daily  . lactulose  30 g Oral TID  . multivitamin with minerals  1  tablet Oral Daily  . nicotine  21 mg Transdermal Daily  . pantoprazole  40 mg Intravenous Q12H  . potassium chloride  40 mEq Oral Q4H  . predniSONE  40 mg Oral Q breakfast  . rifaximin  550 mg Oral BID  . sodium chloride flush  3 mL Intravenous Once  . thiamine  100 mg Oral Daily   Or  . thiamine  100 mg Intravenous Daily   Continuous Infusions: . albumin human 25 g (04/09/20  0959)  . ampicillin-sulbactam (UNASYN) IV       Anti-infectives (From admission, onward)   Start     Dose/Rate Route Frequency Ordered Stop   04/09/20 1515  Ampicillin-Sulbactam (UNASYN) 3 g in sodium chloride 0.9 % 100 mL IVPB     Discontinue     3 g 200 mL/hr over 30 Minutes Intravenous Every 6 hours 04/09/20 1511     04/08/20 1600  rifaximin (XIFAXAN) tablet 550 mg     Discontinue     550 mg Oral 2 times daily 04/08/20 1448     04/04/20 1600  cefTRIAXone (ROCEPHIN) 2 g in sodium chloride 0.9 % 100 mL IVPB  Status:  Discontinued        2 g 200 mL/hr over 30 Minutes Intravenous Every 24 hours 04/04/20 1558 04/07/20 1418             Family Communication/Anticipated D/C date and plan/Code Status   DVT prophylaxis: SCDs Start: 04/04/20 1350     Code Status: Full Code  Family Communication: Plan discussed with patient Disposition Plan:    Status is: Inpatient  Remains inpatient appropriate because:IV treatments appropriate due to intensity of illness or inability to take PO   Dispo: The patient is from: Home              Anticipated d/c is to: Home              Anticipated d/c date is: > 3 days              Patient currently is not medically stable to d/c.           Subjective:   Patient is more communicative today and her mental status continues to improve.  She complained of abdominal pain this morning.  Later in the day, Nicholaus BloomKelley, her nurse reported patient aspirated while taking her lactulose this morning.  She became tachycardic and oxygen saturation dropped to 78% on room air.  She was subsequently placed on Venturi mask and oxygen improved to 92% on 6 L/min oxygen.  Objective:    Vitals:   04/08/20 2000 04/09/20 0500 04/09/20 0745 04/09/20 1230  BP:   123/87 116/78  Pulse:   (!) 110 (!) 120  Resp: 17  18 19   Temp:   98.3 F (36.8 C) 98.1 F (36.7 C)  TempSrc:   Oral Axillary  SpO2:   95% 98%  Weight:  52 kg    Height:       No data  found.   Intake/Output Summary (Last 24 hours) at 04/09/2020 1512 Last data filed at 04/09/2020 1145 Gross per 24 hour  Intake 1116.86 ml  Output 0 ml  Net 1116.86 ml   Filed Weights   04/06/20 0451 04/08/20 0328 04/09/20 0500  Weight: 52.2 kg 52.9 kg 52 kg    Exam:  GEN: NAD SKIN: Jaundice, spider angioma on the chest and face EYES: icteric ENT: MMM CV: Tachycardic PULM: No wheezing or rales heard  ABD: soft, mildly distended, mild mid abdominal tenderness, +BS CNS: Alert, oriented x3.  She is able to follow simple commands.  She has fine tremors of bilateral hands. EXT: No edema or tenderness   Data Reviewed:   I have personally reviewed following labs and imaging studies:  Labs: Labs show the following:   Basic Metabolic Panel: Recent Labs  Lab 04/04/20 1439 04/05/20 0520 04/05/20 1808 04/05/20 1808 04/06/20 1779 04/06/20 3903 04/07/20 0505 04/07/20 0505 04/08/20 0650 04/09/20 0511  NA  --    < > 127*  --  129*  --  131*  --  131* 133*  K  --    < > 4.0   < > 3.9   < > 3.7   < > 3.5 3.2*  CL  --    < > 84*  --  88*  --  93*  --  95* 98  CO2  --    < > 29  --  27  --  29  --  25 23  GLUCOSE  --    < > 133*  --  127*  --  96  --  107* 119*  BUN  --    < > 56*  --  52*  --  45*  --  40* 33*  CREATININE  --    < > 1.32*  --  1.14*  --  1.17*  --  0.91 0.87  CALCIUM  --    < > 7.6*  --  7.6*  --  7.9*  --  8.1* 8.6*  MG 2.6*  --   --   --  2.7*  --   --   --  2.9* 3.0*  PHOS UNABLE TO REPORT DUE TO ICTERUS  --   --   --  UNABLE TO REPORT DUE TO ICTERUS  --   --   --  UNABLE TO REPORT DUE TO ICTERUS NOT CALCULATED   < > = values in this interval not displayed.   GFR Estimated Creatinine Clearance: 69.2 mL/min (by C-G formula based on SCr of 0.87 mg/dL). Liver Function Tests: Recent Labs  Lab 04/04/20 0805 04/06/20 0337 04/07/20 0505 04/08/20 0650 04/09/20 0511  AST 174* 225* 228* 238* 199*  ALT 50* 60* 67* 83* 80*  ALKPHOS 146* 143* 131* 172* 159*   BILITOT 20.1* 17.8* 16.0* 15.6* 12.9*  PROT 5.7* 5.3* 5.0* 5.4* 5.7*  ALBUMIN 2.5* 2.3* 2.1* 2.4* 2.9*   Recent Labs  Lab 04/04/20 0805  LIPASE 49   Recent Labs  Lab 04/04/20 1054 04/08/20 0650 04/09/20 0511  AMMONIA RESULTS UNAVAILABLE DUE TO INTERFERING SUBSTANCE 66* 67*   Coagulation profile Recent Labs  Lab 04/04/20 1036 04/06/20 0337 04/07/20 0505  INR 1.3* 1.4* 1.4*    CBC: Recent Labs  Lab 04/05/20 0520 04/05/20 0520 04/05/20 1808 04/06/20 0337 04/07/20 0505 04/08/20 0650 04/09/20 0511  WBC 11.4*  --   --  14.9* 12.7* 17.3* 15.5*  NEUTROABS  --   --   --  12.5* 9.4* 13.7* 13.0*  HGB 7.0*   < > 8.6* 8.5* 8.2* 9.2* 8.5*  HCT 18.9*   < > 24.5* 24.4* 23.7* 26.6* 25.4*  MCV 99.5  --   --  100.0 101.7* 100.0 104.1*  PLT 124*  --   --  141* 125* 142* 122*   < > = values in this interval not displayed.   Cardiac Enzymes: No results for input(s): CKTOTAL, CKMB, CKMBINDEX, TROPONINI in the  last 168 hours. BNP (last 3 results) No results for input(s): PROBNP in the last 8760 hours. CBG: No results for input(s): GLUCAP in the last 168 hours. D-Dimer: No results for input(s): DDIMER in the last 72 hours. Hgb A1c: No results for input(s): HGBA1C in the last 72 hours. Lipid Profile: No results for input(s): CHOL, HDL, LDLCALC, TRIG, CHOLHDL, LDLDIRECT in the last 72 hours. Thyroid function studies: No results for input(s): TSH, T4TOTAL, T3FREE, THYROIDAB in the last 72 hours.  Invalid input(s): FREET3 Anemia work up: No results for input(s): VITAMINB12, FOLATE, FERRITIN, TIBC, IRON, RETICCTPCT in the last 72 hours. Sepsis Labs: Recent Labs  Lab 04/06/20 0337 04/07/20 0505 04/08/20 0650 04/09/20 0511  WBC 14.9* 12.7* 17.3* 15.5*    Microbiology Recent Results (from the past 240 hour(s))  SARS Coronavirus 2 by RT PCR (hospital order, performed in [facility] hospital lab) Nasopharyngeal Nasopharyngeal Swab     Status: None   Collection Time: 04/04/20  12:11 PM   Specimen: Nasopharyngeal Swab  Result Value Ref Range Status   SARS Coronavirus 2 NEGATIVE NEGATIVE Final    Comment: (NOTE) SARS-CoV-2 target nucleic acids are NOT DETECTED.  The SARS-CoV-2 RNA is generally detectable in upper and lower respiratory specimens during the acute phase of infection. The lowest concentration of SARS-CoV-2 viral copies this assay can detect is 250 copies / mL. A negative result does not preclude SARS-CoV-2 infection and should not be used as the sole basis for treatment or other patient management decisions.  A negative result may occur with improper specimen collection / handling, submission of specimen other than nasopharyngeal swab, presence of viral mutation(s) within the areas targeted by this assay, and inadequate number of viral copies (<250 copies / mL). A negative result must be combined with clinical observations, patient history, and epidemiological information.  Fact Sheet for Patients:   [employer]  Fact Sheet for Healthcare Providers: https://pope.com/  This test is not yet approved or  cleared by the [place] FDA and has been authorized for detection and/or diagnosis of SARS-CoV-2 by FDA under an Emergency Use Authorization (EUA).  This EUA will remain in effect (meaning this test can be used) for the duration of the COVID-19 declaration under Section 564(b)(1) of the Act, 21 U.S.C. section 360bbb-3(b)(1), unless the authorization is terminated or revoked sooner.  Performed at [facility], [address], [place], [place] [address]   Urine Culture     Status: Abnormal   Collection Time: 04/05/20  5:40 PM   Specimen: Urine, Random  Result Value Ref Range Status   Specimen Description   Final    URINE, RANDOM Performed at [facility], [address], [place], [place] [address]    Special Requests   Final    NONE Performed at [facility], [address] [place], [place] [address]    Culture (A)  Final    <10,000 COLONIES/mL INSIGNIFICANT GROWTH Performed at [facility], [address] [place], [place] [address]    Report Status 04/07/2020 FINAL  Final    Procedures and diagnostic studies:  DG Chest Port 1 View  Result Date: 04/09/2020 CLINICAL DATA:  Aspiration EXAM: PORTABLE CHEST 1 VIEW COMPARISON:  April 04, 2020 FINDINGS: There are areas of airspace opacity in the lung bases with associated atelectasis in the right base. Heart size and pulmonary vascularity are normal. No adenopathy. No bone lesions. IMPRESSION: Bibasilar airspace opacity consistent with either pneumonia or aspiration. Both entities may  be present concurrently. There is stable right base atelectasis as well. Heart size normal. Electronically Signed   By: Bretta Bang III M.D.   On: 04/09/2020 11:20               LOS: 5 days   Andry Bogden  [facility]     04/09/2020, 3:12 PM

## 2020-04-10 ENCOUNTER — Inpatient Hospital Stay: Payer: Medicaid Other

## 2020-04-10 LAB — CBC WITH DIFFERENTIAL/PLATELET
Abs Immature Granulocytes: 0.11 10*3/uL — ABNORMAL HIGH (ref 0.00–0.07)
Basophils Absolute: 0 10*3/uL (ref 0.0–0.1)
Basophils Relative: 0 %
Eosinophils Absolute: 0 10*3/uL (ref 0.0–0.5)
Eosinophils Relative: 0 %
HCT: 23.7 % — ABNORMAL LOW (ref 36.0–46.0)
Hemoglobin: 7.8 g/dL — ABNORMAL LOW (ref 12.0–15.0)
Immature Granulocytes: 1 %
Lymphocytes Relative: 9 %
Lymphs Abs: 1.2 10*3/uL (ref 0.7–4.0)
MCH: 35 pg — ABNORMAL HIGH (ref 26.0–34.0)
MCHC: 32.9 g/dL (ref 30.0–36.0)
MCV: 106.3 fL — ABNORMAL HIGH (ref 80.0–100.0)
Monocytes Absolute: 0.9 10*3/uL (ref 0.1–1.0)
Monocytes Relative: 6 %
Neutro Abs: 11.7 10*3/uL — ABNORMAL HIGH (ref 1.7–7.7)
Neutrophils Relative %: 84 %
Platelets: 122 10*3/uL — ABNORMAL LOW (ref 150–400)
RBC: 2.23 MIL/uL — ABNORMAL LOW (ref 3.87–5.11)
RDW: 19.9 % — ABNORMAL HIGH (ref 11.5–15.5)
WBC: 13.9 10*3/uL — ABNORMAL HIGH (ref 4.0–10.5)
nRBC: 0 % (ref 0.0–0.2)

## 2020-04-10 LAB — COMPREHENSIVE METABOLIC PANEL
ALT: 70 U/L — ABNORMAL HIGH (ref 0–44)
AST: 163 U/L — ABNORMAL HIGH (ref 15–41)
Albumin: 3.1 g/dL — ABNORMAL LOW (ref 3.5–5.0)
Alkaline Phosphatase: 139 U/L — ABNORMAL HIGH (ref 38–126)
Anion gap: 10 (ref 5–15)
BUN: 30 mg/dL — ABNORMAL HIGH (ref 6–20)
CO2: 24 mmol/L (ref 22–32)
Calcium: 8.4 mg/dL — ABNORMAL LOW (ref 8.9–10.3)
Chloride: 100 mmol/L (ref 98–111)
Creatinine, Ser: 0.82 mg/dL (ref 0.44–1.00)
GFR calc Af Amer: >60 mL/min (ref >60)
GFR calc non Af Amer: >60 mL/min (ref >60)
Glucose, Bld: 99 mg/dL (ref 70–99)
Potassium: 4.2 mmol/L (ref 3.5–5.1)
Sodium: 134 mmol/L — ABNORMAL LOW (ref 135–145)
Total Bilirubin: 12.9 mg/dL — ABNORMAL HIGH (ref 0.3–1.2)
Total Protein: 5.6 g/dL — ABNORMAL LOW (ref 6.5–8.1)

## 2020-04-10 LAB — BODY FLUID CELL COUNT WITH DIFFERENTIAL
Eos, Fluid: 0 %
Lymphs, Fluid: 5 %
Monocyte-Macrophage-Serous Fluid: 88 %
Neutrophil Count, Fluid: 7 %
Total Nucleated Cell Count, Fluid: 70 cu mm

## 2020-04-10 LAB — GLUCOSE, PLEURAL OR PERITONEAL FLUID: Glucose, Fluid: 134 mg/dL

## 2020-04-10 LAB — ALBUMIN, PLEURAL OR PERITONEAL FLUID: Albumin, Fluid: <1 g/dL

## 2020-04-10 LAB — PHOSPHORUS: Phosphorus: UNDETERMINED mg/dL (ref 2.5–4.6)

## 2020-04-10 LAB — PROTEIN, PLEURAL OR PERITONEAL FLUID: Total protein, fluid: <3 g/dL

## 2020-04-10 LAB — MAGNESIUM: Magnesium: 3.1 mg/dL — ABNORMAL HIGH (ref 1.7–2.4)

## 2020-04-10 LAB — AMMONIA: Ammonia: 51 umol/L — ABNORMAL HIGH (ref 9–35)

## 2020-04-10 MED ORDER — ALBUMIN HUMAN 25 % IV SOLN: 25.0000 g | Freq: Once | INTRAVENOUS | Status: DC

## 2020-04-10 MED ORDER — TRAMADOL HCL 50 MG PO TABS
50.0000 mg | ORAL_TABLET | Freq: Three times a day (TID) | ORAL | Status: DC | PRN
  Administered 2020-04-10 – 2020-04-15 (×12): 50 mg via ORAL
  Filled 2020-04-10 (×13): qty 1

## 2020-04-10 MED ORDER — MEGESTROL ACETATE 20 MG PO TABS
40.0000 mg | ORAL_TABLET | Freq: Every day | ORAL | Status: DC
  Administered 2020-04-10 – 2020-04-15 (×6): 40 mg via ORAL
  Filled 2020-04-10 (×8): qty 2

## 2020-04-10 MED ORDER — BOOST / RESOURCE BREEZE PO LIQD CUSTOM
1.0000 | Freq: Three times a day (TID) | ORAL | Status: DC
  Administered 2020-04-10 – 2020-04-14 (×10): 1 via ORAL

## 2020-04-10 NOTE — Progress Notes (Signed)
Nutrition Follow Up Note   DOCUMENTATION CODES:   Severe malnutrition in context of social or environmental circumstances  INTERVENTION:   Boost Breeze po TID, each supplement provides 250 kcal and 9 grams of protein  Magic cup TID with meals, each supplement provides 290 kcal and 9 grams of protein  [employer] Instant Breakfast TID- each packet provides 130kcal and 5g protein   MVI, folic acid and thiamine daily in setting of etoh abuse   Pt is at high refeed risk; recommend monitor K, Mg and P labs daily as oral intake improves.   NUTRITION DIAGNOSIS:   Severe Malnutrition related to social / environmental circumstances (etoh abuse, suspected liver disease) as evidenced by moderate to severe fat depletions, moderate to severe muscle depletions, 26 percent weight loss in 1 year.  GOAL:   Patient will meet greater than or equal to 90% of their needs  -not met   MONITOR:   PO intake, Supplement acceptance, Labs, Weight trends, Skin, I & O's  ASSESSMENT:   44 year old female with h/o etoh abuse and lung nodule who is s/p surgery for perirectal abscess and hemorrhoids on 4/26 and who is now admitted with suspected alcoholic liver disease.   Met with pt and pt's husband in room today. Pt is much more alert today and is able to hold a conversation and answer questions. Pt has continued to have poor appetite and oral intake in hospital; pt is eating <25% of meals and is refusing Ensure supplements. Pt ate only bites of her breakfast and lunch today. Pt reports that her appetite is improving and that she feels more hungry today than she been. Pt does not like the Ensure but she did like the Boost Breeze and drank one after lunch today. Pt reports that she has drank [employer] at home before and she does like this. 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 RD discussed with patient today the recommendation for nasogastric tube placement and tube feeds. Pt is very adamant  that she does not want to have temporary feeding tube placed. Pt agrees to drink Boost Breeze, [employer] with whole milk and eat [employer]. RD also discussed recommendations for a low sodium/high protein diet today. Husband is very interested in learning more about patient's diet; RD will provide handouts for pt at discharge. Per chart, pt is fairly weight stable since admit. Pt is at refeed risk; however, unable to check phosphorus lab r/t icterus.   Medications reviewed and include: folic acid, lactulose, MVI, nicotine, protonix, prednisone, thiamine, unasyn, albumin  Labs reviewed: Na 134(L), BUN 30(H), Mg 3.1(H), alk phos 139(H), AST 163(H), ALT 70(H), ammonia 51(H), tbili 12.9(H) Wbc- 13.9(H), Hgb 7.8(L), Hct 23.7(L), MCH 35.0(H)  Diet Order:   Diet Order            Diet 2 gram sodium Room service appropriate? Yes; Fluid consistency: Thin  Diet effective now                EDUCATION NEEDS:   Not appropriate for education at this time  Skin:  Skin Assessment: Reviewed RN Assessment (petechiae, jaundice)  Last BM:  8/10- type 6  Height:   Ht Readings from Last 1 Encounters:  04/04/20 '5\' 8"'  (1.727 m)    Weight:   Wt Readings from Last 1 Encounters:  04/09/20 52 kg    Ideal Body Weight:  63.6 kg  BMI:  Body mass index is 17.43 kg/m.  Estimated Nutritional Needs:  Kcal:  1700-1900kcal/day  Protein:  85-95g/day  Fluid:  >1.6L/day  Koleen Distance MS, RD, LDN Please refer to [facility] for RD and/or RD on-call/weekend/after hours pager

## 2020-04-10 NOTE — Procedures (Signed)
PROCEDURE SUMMARY:  Successful image-guided paracentesis from the right lower abdomen.  Yielded 1.35 liters of clear, bright yellow fluid.  No immediate complications.  EBL: zero Patient tolerated well.   Specimen was sent for labs.  Please see imaging section of Epic for full dictation.  Villa Herb PA-C 04/10/2020 4:10 PM

## 2020-04-10 NOTE — Progress Notes (Signed)
Jill Repress, MD [address]  [address]  [place], [place] [address]  Main: [phone]  Fax: [phone] Pager: [phone]   Subjective: Patient is much more alert today.  However, her appetite is poor.  She did have bowel movement yesterday.  She feels very weak but would like to get up and walk.  She is on opioid medication for upper abdominal discomfort   Objective: Vital signs in last 24 hours: Vitals:   04/09/20 2007 04/10/20 0605 04/10/20 0808 04/10/20 1146  BP: 113/75 103/75 98/67 104/71  Pulse: (!) 105 (!) 105 (!) 108 (!) 110  Resp: 20 20 18 18   Temp: 98.3 F (36.8 C) 98.2 F (36.8 C) 98.6 F (37 C) 98.4 F (36.9 C)  TempSrc: Oral Oral Oral   SpO2: 96% 98% 99% 97%  Weight:      Height:       Weight change:   Intake/Output Summary (Last 24 hours) at 04/10/2020 1342 Last data filed at 04/10/2020 0945 Gross per 24 hour  Intake 444 ml  Output 400 ml  Net 44 ml     Exam: Heart:: tachycardic Lungs: normal and clear to auscultation Abdomen: Diffusely distended, tympanic to percussion, mild epigastric tenderness   Lab Results: CBC Latest Ref Rng & Units 04/10/2020 04/09/2020 04/08/2020  WBC 4.0 - 10.5 K/uL 13.9(H) 15.5(H) 17.3(H)  Hemoglobin 12.0 - 15.0 g/dL 7.8(L) 8.5(L) 9.2(L)  Hematocrit 36 - 46 % 23.7(L) 25.4(L) 26.6(L)  Platelets 150 - 400 K/uL 122(L) 122(L) 142(L)   CMP Latest Ref Rng & Units 04/10/2020 04/09/2020 04/08/2020  Glucose 70 - 99 mg/dL 99 06/08/2020) 149(F)  BUN 6 - 20 mg/dL 026(V) 78(H) 88(F)  Creatinine 0.44 - 1.00 mg/dL 02(D 7.41 2.87  Sodium 135 - 145 mmol/L 134(L) 133(L) 131(L)  Potassium 3.5 - 5.1 mmol/L 4.2 3.2(L) 3.5  Chloride 98 - 111 mmol/L 100 98 95(L)  CO2 22 - 32 mmol/L 24 23 25   Calcium 8.9 - 10.3 mg/dL 8.67) ) 8.1(L)  Total Protein 6.5 - 8.1 g/dL 6.7(M) 5.7(L) 5.4(L)  Total Bilirubin 0.3 - 1.2 mg/dL 12.9(H) 12.9(H) 15.6(H)  Alkaline Phos 38 - 126 U/L 139(H) 159(H) 172(H)  AST 15 - 41 U/L 163(H) 199(H) 238(H)    ALT 0 - 44 U/L 70(H) 80(H) 83(H)    Micro Results: Recent Results (from the past 240 hour(s))  SARS Coronavirus 2 by RT PCR (hospital order, performed in [facility] hospital lab) Nasopharyngeal Nasopharyngeal Swab     Status: None   Collection Time: 04/04/20 12:11 PM   Specimen: Nasopharyngeal Swab  Result Value Ref Range Status   SARS Coronavirus 2 NEGATIVE NEGATIVE Final    Comment: (NOTE) SARS-CoV-2 target nucleic acids are NOT DETECTED.  The SARS-CoV-2 RNA is generally detectable in upper and lower respiratory specimens during the acute phase of infection. The lowest concentration of SARS-CoV-2 viral copies this assay can detect is 250 copies / mL. A negative result does not preclude SARS-CoV-2 infection and should not be used as the sole basis for treatment or other patient management decisions.  A negative result may occur with improper specimen collection / handling, submission of specimen other than nasopharyngeal swab, presence of viral mutation(s) within the areas targeted by this assay, and inadequate number of viral copies (<250 copies / mL). A negative result must be combined with clinical observations, patient history, and epidemiological information.  Fact Sheet for Patients:   [facility]  Fact Sheet for Healthcare Providers: 06/04/20  This test is  not yet approved or  cleared by the [place] and has been authorized for detection and/or diagnosis of SARS-CoV-2 by FDA under an Emergency Use Authorization (EUA).  This EUA will remain in effect (meaning this test can be used) for the duration of the COVID-19 declaration under Section 564(b)(1) of the Act, 21 U.S.C. section 360bbb-3(b)(1), unless the authorization is terminated or revoked sooner.  Performed at [facility], [address], [place], [place] [address]   Urine Culture     Status: Abnormal   Collection Time:  04/05/20  5:40 PM   Specimen: Urine, Random  Result Value Ref Range Status   Specimen Description   Final    URINE, RANDOM Performed at [facility], [address] [place], [place] [address]    Special Requests   Final    NONE Performed at [facility], [address] [place], [place] [address]    Culture (A)  Final    <10,000 COLONIES/mL INSIGNIFICANT GROWTH Performed at [facility], [address], [place], [place] [address]    Report Status 04/07/2020 FINAL  Final   Studies/Results: DG Chest Port 1 View  Result Date: 04/09/2020 CLINICAL DATA:  Aspiration EXAM: PORTABLE CHEST 1 VIEW COMPARISON:  April 04, 2020 FINDINGS: There are areas of airspace opacity in the lung bases with associated atelectasis in the right base. Heart size and pulmonary vascularity are normal. No adenopathy. No bone lesions. IMPRESSION: Bibasilar airspace opacity consistent with either pneumonia or aspiration. Both entities may be present concurrently. There is stable right base atelectasis as well. Heart size normal. Electronically Signed   By: Bretta Bang III M.D.   On: 04/09/2020 11:20    Medications:  I have reviewed the patient's current medications. Prior to Admission:  Medications Prior to Admission  Medication Sig Dispense Refill Last Dose   loratadine (CLARITIN) 10 MG tablet Take 1 tablet (10 mg total) by mouth daily. 30 tablet 1 Past Week at PRN   nicotine (NICODERM CQ - DOSED IN MG/24 HOURS) 21 mg/24hr patch Place 1 patch (21 mg total) onto the skin daily. 28 patch 0 04/03/2020 at 0800   Scheduled:  Chlorhexidine Gluconate Cloth  6 each Topical Daily   feeding supplement (ENSURE ENLIVE)  237 mL Oral TID BM   folic acid  1 mg Oral Daily   multivitamin with minerals  1 tablet Oral Daily   nicotine  21 mg Transdermal Daily   pantoprazole  40 mg Intravenous Q12H   predniSONE  40 mg Oral Q breakfast   rifaximin  550 mg Oral BID   sodium chloride flush  3 mL  Intravenous Once   thiamine  100 mg Oral Daily   Or   thiamine  100 mg Intravenous Daily   Continuous:  albumin human 25 g (04/10/20 1027)   ampicillin-sulbactam (UNASYN) IV 3 g (04/10/20 0951)   LPF:[id number] **OR** ondansetron (ZOFRAN) IV, sodium chloride flush, traMADol Anti-infectives (From admission, onward)    Start     Dose/Rate Route Frequency Ordered Stop   04/09/20 1515  Ampicillin-Sulbactam (UNASYN) 3 g in sodium chloride 0.9 % 100 mL IVPB     Discontinue     3 g 200 mL/hr over 30 Minutes Intravenous Every 6 hours 04/09/20 1511     04/08/20 1600  rifaximin (XIFAXAN) tablet 550 mg     Discontinue     550 mg Oral 2 times daily 04/08/20 1448     04/04/20 1600  cefTRIAXone (  ROCEPHIN) 2 g in sodium chloride 0.9 % 100 mL IVPB  Status:  Discontinued        2 g 200 mL/hr over 30 Minutes Intravenous Every 24 hours 04/04/20 1558 04/07/20 1418      Scheduled Meds:  Chlorhexidine Gluconate Cloth  6 each Topical Daily   feeding supplement (ENSURE ENLIVE)  237 mL Oral TID BM   folic acid  1 mg Oral Daily   multivitamin with minerals  1 tablet Oral Daily   nicotine  21 mg Transdermal Daily   pantoprazole  40 mg Intravenous Q12H   predniSONE  40 mg Oral Q breakfast   rifaximin  550 mg Oral BID   sodium chloride flush  3 mL Intravenous Once   thiamine  100 mg Oral Daily   Or   thiamine  100 mg Intravenous Daily   Continuous Infusions:  albumin human 25 g (04/10/20 1027)   ampicillin-sulbactam (UNASYN) IV 3 g (04/10/20 0951)   PRN Meds:.ondansetron **OR** ondansetron (ZOFRAN) IV, sodium chloride flush, traMADol   Assessment: Principal Problem:   Acute alcoholic liver disease Active Problems:   Hypokalemia   Hyponatremia   Nicotine dependence   AKI (acute kidney injury) (HCC)   Anemia  44 year old female admitted with alcoholic hepatitis, commenced on prednisone,   Plan: Alcoholic hepatitis:  Prednisone commenced on 8/5, Lille score on day 7 shows  0.1.since it is  less than 0.45, indicates that patient is responding to prednisone. Therefore recommend 3 more weeks of prednisone 40 mg daily followed by 16 days taper by 10 mg every 4 days until down to 10 mg then decrease by 5 mg every 3 days until finished. Monitor LFTs daily, total bilirubin slowly improving PSE: Recommend to discontinue lactulose due to abdominal bloating, continue rifaximin 550 mg.  Switch to MiraLAX as needed for constipation High-protein diet, encourage p.o. intake Can try supplemental tube feeds with placement of Dobbhoff tube temporarily Complete abstinence from alcohol use Continue multivitamin plus thiamine plus folic acid daily Minimize administration of opioid use for pain Low-sodium diet, recommend nutrition consult for counseling  AKI: Gradually improving Patient has tachycardia, appears dehydrated, elevated BUN Continue maintenance Albumin infusion daily for 3 days, day 2 today Strict ins and outs  Pneumonia, likely secondary to aspiration Currently on antibiotics for aspiration pneumonia which is reasonable given her alcoholic hepatitis and on steroids  Macrocytic anemia, hemoglobin is downtrending with no signs of active GI bleed We will monitor closely for now  Encourage out of bed to chair and ambulate as tolerated  GI will follow along with you    LOS: 6 days   Obadiah Dennard 04/10/2020, 1:42 PM

## 2020-04-10 NOTE — Progress Notes (Signed)
PROGRESS NOTE  Jill Reyes:[id number] DOB: 05/04/1976 DOA: 04/04/2020 PCP: Patient, No Pcp Per  Brief History   Jill Reyes is a 44 y.o. female with medical history significant foralcohol abuse who presented to the ER for evaluation of weakness which has progressively worsened over the last several weeks. Patient states that she was drinking a few bottle of vodka daily but quit about 2 weeksprior to her admission.  She also complained of nausea, vomiting, anorexia, abdominal distention and yellowish discoloration of her skin and eyes.  Work-up revealed acute alcoholic hepatitis probable underlying alcoholic liver cirrhosis.  She was confused and this was thought to be due to hepatic encephalopathy.  She was treated with prednisone, lactulose and rifaximin.  She also had acute kidney injury and hyponatremia that were treated with IV fluids.  She was treated with empiric IV Rocephin for abnormal urinalysis, suspected UTI.  Urine culture did not show any growth.  She was transfused with 1 unit of packed red blood cells for severe anemia.  She had hypokalemia that was corrected.  Hospital course was complicated by aspiration pneumonia and acute hypoxemic respiratory failure which occurred after patient choked/aspirated while taking lactulose.  She was started on IV Unasyn for aspiration pneumonia.  She required oxygen therapy briefly for acute hypoxemic respiratory failure.  Consultants  . Gastroenterology . Interventional radiology  Procedures  . paracentesis  Antibiotics   Anti-infectives (From admission, onward)   Start     Dose/Rate Route Frequency Ordered Stop   04/09/20 1515  Ampicillin-Sulbactam (UNASYN) 3 g in sodium chloride 0.9 % 100 mL IVPB     Discontinue     3 g 200 mL/hr over 30 Minutes Intravenous Every 6 hours 04/09/20 1511     04/08/20 1600  rifaximin (XIFAXAN) tablet 550 mg     Discontinue     550 mg Oral 2 times daily 04/08/20 1448     04/04/20 1600  cefTRIAXone  (ROCEPHIN) 2 g in sodium chloride 0.9 % 100 mL IVPB  Status:  Discontinued        2 g 200 mL/hr over 30 Minutes Intravenous Every 24 hours 04/04/20 1558 04/07/20 1418    .  Subjective  The patient is lying in bed. She appears acutely ill. She complains of pain in her abdomen.  Objective   Vitals:  Vitals:   04/10/20 1553 04/10/20 1618  BP: 112/75 103/65  Pulse: (!) 116 (!) 114  Resp:    Temp:  98.5 F (36.9 C)  SpO2: 94% 97%   Exam:  Constitutional:  . The patient is awake, alert, and oriented x 3. No acute distress. Eyes:  . pupils and irises appear normal . Normal lids and conjunctivae ENMT:  . grossly normal hearing  . Lips appear normal . external ears, nose appear normal . Oropharynx: mucosa, tongue,posterior pharynx appear normal Neck:  . neck appears normal, no masses, normal ROM, supple . no thyromegaly Respiratory:  . No increased work of breathing. . No wheezes, rales, or rhonchi . No tactile fremitus Cardiovascular:  . Regular rate and rhythm . No murmurs, ectopy, or gallups. . No lateral PMI. No thrills. Abdomen:  . Abdomen is soft, non-tender, non-distended . No hernias, masses, or organomegaly . Normoactive bowel sounds.  Musculoskeletal:  . No cyanosis, clubbing, or edema Skin:  . No rashes, lesions, ulcers . palpation of skin: no induration or nodules Neurologic:  . CN 2-12 intact . Sensation all 4 extremities intact Psychiatric:  . Mental status o Mood,  affect appropriate o Orientation to person, place, time  . judgment and insight appear intact  I have personally reviewed the following:   Today's Data  . Vitals, CMP, CBC  Micro Data  . Urine culture (04/05/2020) - no significant growth . Body fluid culture: pending  Scheduled Meds: . Chlorhexidine Gluconate Cloth  6 each Topical Daily  . feeding supplement  1 Container Oral TID BM  . folic acid  1 mg Oral Daily  . megestrol  40 mg Oral Daily  . multivitamin with minerals  1  tablet Oral Daily  . nicotine  21 mg Transdermal Daily  . pantoprazole  40 mg Intravenous Q12H  . predniSONE  40 mg Oral Q breakfast  . rifaximin  550 mg Oral BID  . sodium chloride flush  3 mL Intravenous Once  . thiamine  100 mg Oral Daily   Or  . thiamine  100 mg Intravenous Daily   Continuous Infusions: . albumin human 25 g (04/10/20 1027)  . ampicillin-sulbactam (UNASYN) IV 3 g (04/10/20 1720)    Principal Problem:   Acute alcoholic liver disease Active Problems:   Hypokalemia   Hyponatremia   Nicotine dependence   AKI (acute kidney injury) (HCC)   Anemia   LOS: 6 days   A & P  Acute alcoholic hepatitis, probable alcoholic liver cirrhosis with hepatic encephalopathy, coagulopathy, hypoalbuminemia: Mental status is slowly improving.  Bilirubin level continues to trend down.  Ammonia level 66 on 04/08/2020.  CT head on 04/06/2020 did not show any acute abnormality..  Continue lactulose and prednisone.  Rifaximin and IV albumin have been added. Today the  Patient's abdomen was taut and tender. She was sent for paracentesis with IR. They obtained 1.8 L. Chemistry, cell count and cultures are pending. She is receiving an albumin infusion as per GI.   Severe anemia, reported history of recent melena: Hemoglobin was 12.6 on 01/25/2020.  S/p transfusion with 1 unit of packed red blood cells on 04/05/2020.  H&H has remained stable since transfusion..  Continue IV Protonix.  Monitor H&H.  Aspiration pneumonia, acute hypoxemic respiratory failure: Start IV Unasyn.  Oxygenation has improved and she's tolerating room air.  Hyponatremia: Resolving. Monitor sodium level.  Hypokalemia: Resolved. Monitor.   Hypotension: BP's remain low normal, but improved over previous. Monitor.  Acute kidney injury: Creatinine has improved.  Discontinue IV fluids.  Abnormal urinalysis, questionable acute UTI, leukocytosis: Insignificant growth on urine culture.  She has received 3 days worth of IV  Rocephin which was completed on 04/07/2020.  Thrombocytopenia: This is likely from liver disease.Monitor and avoid anticoagulation.  Alcohol use disorder: Continue thiamine, folic acid and Ativan as needed.  Debility: Patient is extremely weak.  Prognosis is poor.  I have seen and examined this patient myself. I have spent 35 minutes in her evaluation and care.  DVT prophylaxis: SCD's CODE STATUS: Full code Family Communication: Family at bedside. Disposition: Status is: Inpatient  Remains inpatient appropriate because:IV treatments appropriate due to intensity of illness or inability to take PO   Dispo: The patient is from: Home              Anticipated d/c is to: SNF              Anticipated d/c date is: 3 days              Patient currently is not medically stable to d/c.  Brenan Modesto, DO [facility] Direct contact: see [email or web address]  7PM-7AM contact  night coverage as above 04/10/2020, 7:07 PM  LOS: 6 days

## 2020-04-11 DIAGNOSIS — K7031 Alcoholic cirrhosis of liver with ascites: Secondary | ICD-10-CM

## 2020-04-11 DIAGNOSIS — K7011 Alcoholic hepatitis with ascites: Principal | ICD-10-CM

## 2020-04-11 LAB — COMPREHENSIVE METABOLIC PANEL
ALT: 60 U/L — ABNORMAL HIGH (ref 0–44)
AST: 117 U/L — ABNORMAL HIGH (ref 15–41)
Albumin: 2.9 g/dL — ABNORMAL LOW (ref 3.5–5.0)
Alkaline Phosphatase: 124 U/L (ref 38–126)
Anion gap: 12 (ref 5–15)
BUN: 27 mg/dL — ABNORMAL HIGH (ref 6–20)
CO2: 23 mmol/L (ref 22–32)
Calcium: 8.6 mg/dL — ABNORMAL LOW (ref 8.9–10.3)
Chloride: 99 mmol/L (ref 98–111)
Creatinine, Ser: 0.86 mg/dL (ref 0.44–1.00)
GFR calc Af Amer: >60 mL/min (ref >60)
GFR calc non Af Amer: >60 mL/min (ref >60)
Glucose, Bld: 101 mg/dL — ABNORMAL HIGH (ref 70–99)
Potassium: 3.5 mmol/L (ref 3.5–5.1)
Sodium: 134 mmol/L — ABNORMAL LOW (ref 135–145)
Total Bilirubin: 12.6 mg/dL — ABNORMAL HIGH (ref 0.3–1.2)
Total Protein: 5.3 g/dL — ABNORMAL LOW (ref 6.5–8.1)

## 2020-04-11 LAB — CBC WITH DIFFERENTIAL/PLATELET
Abs Immature Granulocytes: 0.06 10*3/uL (ref 0.00–0.07)
Basophils Absolute: 0 10*3/uL (ref 0.0–0.1)
Basophils Relative: 0 %
Eosinophils Absolute: 0.1 10*3/uL (ref 0.0–0.5)
Eosinophils Relative: 1 %
HCT: 22.8 % — ABNORMAL LOW (ref 36.0–46.0)
Hemoglobin: 7.7 g/dL — ABNORMAL LOW (ref 12.0–15.0)
Immature Granulocytes: 1 %
Lymphocytes Relative: 12 %
Lymphs Abs: 1.5 10*3/uL (ref 0.7–4.0)
MCH: 35 pg — ABNORMAL HIGH (ref 26.0–34.0)
MCHC: 33.8 g/dL (ref 30.0–36.0)
MCV: 103.6 fL — ABNORMAL HIGH (ref 80.0–100.0)
Monocytes Absolute: 0.6 10*3/uL (ref 0.1–1.0)
Monocytes Relative: 5 %
Neutro Abs: 10.2 10*3/uL — ABNORMAL HIGH (ref 1.7–7.7)
Neutrophils Relative %: 81 %
Platelets: 121 10*3/uL — ABNORMAL LOW (ref 150–400)
RBC: 2.2 MIL/uL — ABNORMAL LOW (ref 3.87–5.11)
RDW: 19.4 % — ABNORMAL HIGH (ref 11.5–15.5)
WBC: 12.5 10*3/uL — ABNORMAL HIGH (ref 4.0–10.5)
nRBC: 0 % (ref 0.0–0.2)

## 2020-04-11 LAB — PH, BODY FLUID: pH, Body Fluid: 7.5

## 2020-04-11 MED ORDER — SPIRONOLACTONE 100 MG PO TABS: 100.0000 mg | ORAL_TABLET | Freq: Every day | ORAL | Status: DC

## 2020-04-11 MED ORDER — FUROSEMIDE 40 MG PO TABS: 40.0000 mg | ORAL_TABLET | Freq: Every day | ORAL | Status: DC

## 2020-04-11 MED ORDER — NON FORMULARY: 5.0000 mg | Freq: Every day | Status: DC

## 2020-04-11 MED ORDER — FUROSEMIDE 20 MG PO TABS
20.0000 mg | ORAL_TABLET | Freq: Every day | ORAL | Status: DC
  Administered 2020-04-12 – 2020-04-15 (×4): 20 mg via ORAL
  Filled 2020-04-11 (×4): qty 1

## 2020-04-11 MED ORDER — MELATONIN 5 MG PO TABS
5.0000 mg | ORAL_TABLET | Freq: Every day | ORAL | Status: DC
  Administered 2020-04-11 – 2020-04-14 (×4): 5 mg via ORAL
  Filled 2020-04-11 (×4): qty 1

## 2020-04-11 MED ORDER — POLYETHYLENE GLYCOL 3350 17 G PO PACK
17.0000 g | PACK | Freq: Every day | ORAL | Status: DC
  Administered 2020-04-11 – 2020-04-15 (×5): 17 g via ORAL
  Filled 2020-04-11 (×5): qty 1

## 2020-04-11 MED ORDER — POTASSIUM CHLORIDE CRYS ER 20 MEQ PO TBCR: 40.0000 meq | EXTENDED_RELEASE_TABLET | ORAL | Status: DC

## 2020-04-11 MED ORDER — SPIRONOLACTONE 25 MG PO TABS
50.0000 mg | ORAL_TABLET | Freq: Every day | ORAL | Status: DC
  Administered 2020-04-12 – 2020-04-15 (×4): 50 mg via ORAL
  Filled 2020-04-11 (×4): qty 2

## 2020-04-11 NOTE — Progress Notes (Signed)
PROGRESS NOTE  Jill Reyes LKG:[id number] DOB: 08/14/1976 DOA: 04/04/2020 PCP: Patient, No Pcp Per  Brief History   Jill Reyes is a 44 y.o. female with medical history significant foralcohol abuse who presented to the ER for evaluation of weakness which has progressively worsened over the last several weeks. Patient states that she was drinking a few bottle of vodka daily but quit about 2 weeksprior to her admission.  She also complained of nausea, vomiting, anorexia, abdominal distention and yellowish discoloration of her skin and eyes.  Work-up revealed acute alcoholic hepatitis probable underlying alcoholic liver cirrhosis.  She was confused and this was thought to be due to hepatic encephalopathy.  She was treated with prednisone, lactulose and rifaximin.  She also had acute kidney injury and hyponatremia that were treated with IV fluids.  She was treated with empiric IV Rocephin for abnormal urinalysis, suspected UTI.  Urine culture did not show any growth.  She was transfused with 1 unit of packed red blood cells for severe anemia.  She had hypokalemia that was corrected.  Hospital course was complicated by aspiration pneumonia and acute hypoxemic respiratory failure which occurred after patient choked/aspirated while taking lactulose.  She was started on IV Unasyn for aspiration pneumonia.  She required oxygen therapy briefly for acute hypoxemic respiratory failure.  Continue IV albumin  And steroids for 3 days as per GI recommendations. The patient underwent paracentesis with 1.8 L fluid removed. It appeared transudative and not indicative of infection.   Consultants  . Gastroenterology . Interventional radiology  Procedures  . paracentesis  Antibiotics   Anti-infectives (From admission, onward)   Start     Dose/Rate Route Frequency Ordered Stop   04/09/20 1515  Ampicillin-Sulbactam (UNASYN) 3 g in sodium chloride 0.9 % 100 mL IVPB     Discontinue     3 g 200 mL/hr over 30  Minutes Intravenous Every 6 hours 04/09/20 1511     04/08/20 1600  rifaximin (XIFAXAN) tablet 550 mg     Discontinue     550 mg Oral 2 times daily 04/08/20 1448     04/04/20 1600  cefTRIAXone (ROCEPHIN) 2 g in sodium chloride 0.9 % 100 mL IVPB  Status:  Discontinued        2 g 200 mL/hr over 30 Minutes Intravenous Every 24 hours 04/04/20 1558 04/07/20 1418     Subjective  The patient is lying in bed. No new complaints.  Objective   Vitals:  Vitals:   04/11/20 1228 04/11/20 1617  BP: 107/70 116/80  Pulse: (!) 114 (!) 113  Resp:    Temp: 99.1 F (37.3 C) 99 F (37.2 C)  SpO2: 97% 97%   Exam:  Constitutional:  . The patient is awake, alert, and oriented x 3. No acute distress. Respiratory:  . No increased work of breathing. . No wheezes, rales, or rhonchi . No tactile fremitus Cardiovascular:  . Regular rate and rhythm . No murmurs, ectopy, or gallups. . No lateral PMI. No thrills. Abdomen:  . Abdomen is soft, positive for tenderness in epigastrum, slightly distended . No hernias, masses, or organomegaly . Normoactive bowel sounds.  Musculoskeletal:  . No cyanosis, clubbing, or edema Skin:  . No rashes, lesions, ulcers . palpation of skin: no induration or nodules Neurologic:  . CN 2-12 intact . Sensation all 4 extremities intact Psychiatric:  . Mental status o Mood, affect appropriate o Orientation to person, place, time  . judgment and insight appear intact  I have personally reviewed  the following:   Today's Data  . Vitals, CMP, CBC . Body fluid cell count, chemistry  Micro Data  . Urine culture (04/05/2020) - no significant growth . Body fluid culture: No growth  Scheduled Meds: . Chlorhexidine Gluconate Cloth  6 each Topical Daily  . feeding supplement  1 Container Oral TID BM  . folic acid  1 mg Oral Daily  . [START ON 04/12/2020] furosemide  20 mg Oral QPC breakfast  . megestrol  40 mg Oral Daily  . melatonin  5 mg Oral QHS  . multivitamin with  minerals  1 tablet Oral Daily  . nicotine  21 mg Transdermal Daily  . pantoprazole  40 mg Intravenous Q12H  . predniSONE  40 mg Oral Q breakfast  . rifaximin  550 mg Oral BID  . sodium chloride flush  3 mL Intravenous Once  . [START ON 04/12/2020] spironolactone  50 mg Oral QPC breakfast  . thiamine  100 mg Oral Daily   Or  . thiamine  100 mg Intravenous Daily   Continuous Infusions: . albumin human 25 g (04/11/20 0946)  . ampicillin-sulbactam (UNASYN) IV 3 g (04/11/20 0932)    Principal Problem:   Acute alcoholic liver disease Active Problems:   Hypokalemia   Hyponatremia   Nicotine dependence   AKI (acute kidney injury) (HCC)   Anemia   LOS: 7 days   A & P  Acute alcoholic hepatitis, probable alcoholic liver cirrhosis with hepatic encephalopathy, coagulopathy, hypoalbuminemia: Mental status is slowly improving.  Bilirubin level continues to trend down.  Ammonia level 66 on 04/08/2020.  CT head on 04/06/2020 did not show any acute abnormality..  Continue lactulose and prednisone.  Rifaximin and IV albumin have been added. Today the  Patient's abdomen was taut and tender. She was sent for paracentesis with IR. They obtained 1.8 L. Chemistry and cell count are consistent with non-infected, transudative fluid. Cultures on the fluid have had no growth. She is receiving an albumin infusion as per GI. GI recommends discharge on 40 of prednisone daily with a slow taper and cessation of alcohol  Severe anemia, reported history of recent melena: Hemoglobin was 12.6 on 01/25/2020.  S/p transfusion with 1 unit of packed red blood cells on 04/05/2020.  H&H has remained stable since transfusion..  Continue IV Protonix.  Monitor H&H. Hemoglobin 7.7 today.  Aspiration pneumonia, acute hypoxemic respiratory failure: Start IV Unasyn.  Oxygenation has improved and she's tolerating room air.  Hyponatremia: Resolving. Monitor sodium level.  Hypokalemia: Resolved. Monitor.   Hypotension: BP's remain  low normal, but improved over previous. Monitor.  Acute kidney injury: Creatinine has improved.  Discontinue IV fluids.  Abnormal urinalysis, questionable acute UTI, leukocytosis: Insignificant growth on urine culture.  She has received 3 days worth of IV Rocephin which was completed on 04/07/2020.  Thrombocytopenia: This is likely from liver disease.Monitor and avoid anticoagulation.  Alcohol use disorder: Continue thiamine, folic acid and Ativan as needed.  Debility: Patient is extremely weak.  Prognosis is poor.  I have seen and examined this patient myself. I have spent 32 minutes in her evaluation and care.  DVT prophylaxis: SCD's CODE STATUS: Full code Family Communication: Family at bedside. Disposition: Status is: Inpatient  Remains inpatient appropriate because:IV treatments appropriate due to intensity of illness or inability to take PO   Dispo: The patient is from: Home              Anticipated d/c is to: SNF  Anticipated d/c date is: 2 days              Patient currently is not medically stable to d/c.  Domanik Rainville, DO [facility] Direct contact: see [email or web address]  7PM-7AM contact night coverage as above 04/11/2020, 5:21 PM  LOS: 6 days

## 2020-04-11 NOTE — Plan of Care (Signed)
  Problem: Education: Goal: Knowledge of General Education information will improve Description Including pain rating scale, medication(s)/side effects and non-pharmacologic comfort measures Outcome: Progressing   

## 2020-04-11 NOTE — Progress Notes (Signed)
Arlyss Repressohini R Raylen Tangonan, MD [address]  [address]  [place], [place] [address]  Main: [phone]  Fax: [phone] Pager: [phone]   Subjective: Patient is much more alert today.  She is trying to eat more.  She underwent paracentesis yesterday and 1.3 L of yellow fluid was removed.  She reports her abdomen is less distended but still limiting her p.o. intake.  She did not have bowel movement today.  She just started working with PT/OT   Objective: Vital signs in last 24 hours: Vitals:   04/10/20 2000 04/11/20 0503 04/11/20 0733 04/11/20 1228  BP:  105/62 110/71 107/70  Pulse:  (!) 105 (!) 118 (!) 114  Resp: 18     Temp:  98.3 F (36.8 C) 98.7 F (37.1 C) 99.1 F (37.3 C)  TempSrc:  Oral Oral Oral  SpO2:  97% 98% 97%  Weight:  52.2 kg    Height:       Weight change:   Intake/Output Summary (Last 24 hours) at 04/11/2020 1540 Last data filed at 04/11/2020 1500 Gross per 24 hour  Intake 563.12 ml  Output 800 ml  Net -236.88 ml     Exam: Heart:: tachycardic Lungs: normal and clear to auscultation Abdomen: Diffusely distended, tympanic to percussion, mild epigastric tenderness   Lab Results: CBC Latest Ref Rng & Units 04/11/2020 04/10/2020 04/09/2020  WBC 4.0 - 10.5 K/uL 12.5(H) 13.9(H) 15.5(H)  Hemoglobin 12.0 - 15.0 g/dL 7.7(L) 7.8(L) 8.5(L)  Hematocrit 36 - 46 % 22.8(L) 23.7(L) 25.4(L)  Platelets 150 - 400 K/uL 121(L) 122(L) 122(L)   CMP Latest Ref Rng & Units 04/11/2020 04/10/2020 04/09/2020  Glucose 70 - 99 mg/dL 962(X101(H) 99 528(U119(H)  BUN 6 - 20 mg/dL 13(K27(H) 44(W30(H) 10(U33(H)  Creatinine 0.44 - 1.00 mg/dL 7.250.86 3.660.82 4.400.87  Sodium 135 - 145 mmol/L 134(L) 134(L) 133(L)  Potassium 3.5 - 5.1 mmol/L 3.5 4.2 3.2(L)  Chloride 98 - 111 mmol/L 99 100 98  CO2 22 - 32 mmol/L 23 24 23   Calcium 8.9 - 10.3 mg/dL 3.4(V8.6(L) 4.2(V8.4(L) 9.5(G8.6(L)  Total Protein 6.5 - 8.1 g/dL 5.3(L) 5.6(L) 5.7(L)  Total Bilirubin 0.3 - 1.2 mg/dL 12.6(H) 12.9(H) 12.9(H)  Alkaline Phos 38 - 126 U/L 124 139(H)  159(H)  AST 15 - 41 U/L 117(H) 163(H) 199(H)  ALT 0 - 44 U/L 60(H) 70(H) 80(H)    Micro Results: Recent Results (from the past 240 hour(s))  SARS Coronavirus 2 by RT PCR (hospital order, performed [facility]) Nasopharyngeal Nasopharyngeal Swab     Status: None   Collection Time: 04/04/20 12:11 PM   Specimen: Nasopharyngeal Swab  Result Value Ref Range Status   SARS Coronavirus 2 NEGATIVE NEGATIVE Final    Comment: (NOTE) SARS-CoV-2 target nucleic acids are NOT DETECTED.  The SARS-CoV-2 RNA is generally detectable in upper and lower respiratory specimens during the acute phase of infection. The lowest concentration of SARS-CoV-2 viral copies this assay can detect is 250 copies / mL. A negative result does not preclude SARS-CoV-2 infection and should not be used as the sole basis for treatment or other patient management decisions.  A negative result may occur with improper specimen collection / handling, submission of specimen other than nasopharyngeal swab, presence of viral mutation(s) within the areas targeted by this assay, and inadequate number of viral copies (<250 copies / mL). A negative result must be combined with clinical observations, patient history, and epidemiological information.  Fact Sheet for Patients:   BoilerBrush.com.cyhttps://www.fda.gov/media/136312/download  Fact  Sheet for Healthcare Providers: https://pope.com/  This test is not yet approved or  cleared by the [place] and has been authorized for detection and/or diagnosis of SARS-CoV-2 by FDA under an Emergency Use Authorization (EUA).  This EUA will remain in effect (meaning this test can be used) for the duration of the COVID-19 declaration under Section 564(b)(1) of the Act, 21 U.S.C. section 360bbb-3(b)(1), unless the authorization is terminated or revoked sooner.  Performed at [facility], [address] [place], [place] [address]   Urine Culture      Status: Abnormal   Collection Time: 04/05/20  5:40 PM   Specimen: Urine, Random  Result Value Ref Range Status   Specimen Description   Final    URINE, RANDOM Performed at [facility], [address], [place], [place] [address]    Special Requests   Final    NONE Performed at [facility], [address] [place], [place] [address]    Culture (A)  Final    <10,000 COLONIES/mL INSIGNIFICANT GROWTH Performed at [facility], [address] [place], [place] [address]    Report Status 04/07/2020 FINAL  Final  Body fluid culture     Status: None (Preliminary result)   Collection Time: 04/10/20  4:08 PM   Specimen: PATH Cytology Peritoneal fluid  Result Value Ref Range Status   Specimen Description   Final    PERITONEAL Performed at [facility], [address], [place], [place] [address]    Special Requests   Final    NONE Performed at [facility], [address] [place], [place] [address]    Gram Stain   Final    RARE WBC PRESENT, PREDOMINANTLY MONONUCLEAR NO ORGANISMS SEEN    Culture   Final    NO GROWTH < 12 HOURS Performed at [facility], [address], [place], [place] [address]    Report Status PENDING  Incomplete   Studies/Results: US Paracentesis  Result Date: 04/10/2020 INDICATION: Patient with history of alcohol abuse, alcoholic hepatitis with new onset abdominal distension. Request to IR for diagnostic and therapeutic paracentesis. EXAM: ULTRASOUND GUIDED DIAGNOSTIC AND THERAPEUTIC PARACENTESIS MEDICATIONS: 10 mL lidocaine 1% COMPLICATIONS: None immediate. PROCEDURE: Informed written consent was obtained from the patient after a discussion of the risks, benefits and alternatives to treatment. A timeout was performed prior to the initiation of the procedure. Initial ultrasound scanning demonstrates a small amount of ascites within the right lower abdominal quadrant. The right lower abdomen was prepped and draped  in the usual sterile fashion. 1% lidocaine was used for local anesthesia. Following this, a 6 Fr Safe-T-Centesis catheter was introduced. An ultrasound image was saved for documentation purposes. The paracentesis was performed. The catheter was removed and a dressing was applied. The patient tolerated the procedure well without immediate post procedural complication. FINDINGS: A total of approximately 1.35 L of clear, bright yellow fluid was removed. Samples were sent to the laboratory as requested by the clinical team. IMPRESSION: Successful ultrasound-guided paracentesis yielding 1.35 liters of peritoneal fluid. Read by Lynnette Caffey, PA-C Electronically Signed   By: Alcide Clever M.D.   On: 04/10/2020 16:39    Medications:  I have reviewed the patient's current medications. Prior to Admission:  Medications Prior to Admission  Medication Sig Dispense Refill Last Dose  . loratadine (CLARITIN) 10 MG tablet Take 1 tablet (10 mg total) by mouth daily. 30 tablet 1 Past Week at PRN  . nicotine (  NICODERM CQ - DOSED IN MG/24 HOURS) 21 mg/24hr patch Place 1 patch (21 mg total) onto the skin daily. 28 patch 0 04/03/2020 at 0800   Scheduled: . Chlorhexidine Gluconate Cloth  6 each Topical Daily  . feeding supplement  1 Container Oral TID BM  . folic acid  1 mg Oral Daily  . [START ON 04/12/2020] furosemide  20 mg Oral QPC breakfast  . megestrol  40 mg Oral Daily  . melatonin  5 mg Oral QHS  . multivitamin with minerals  1 tablet Oral Daily  . nicotine  21 mg Transdermal Daily  . pantoprazole  40 mg Intravenous Q12H  . predniSONE  40 mg Oral Q breakfast  . rifaximin  550 mg Oral BID  . sodium chloride flush  3 mL Intravenous Once  . [START ON 04/12/2020] spironolactone  50 mg Oral QPC breakfast  . thiamine  100 mg Oral Daily   Or  . thiamine  100 mg Intravenous Daily   Continuous: . albumin human 25 g (04/11/20 0946)  . ampicillin-sulbactam (UNASYN) IV 3 g (04/11/20 0932)   HWE:[id number]  **OR** ondansetron (ZOFRAN) IV, sodium chloride flush, traMADol Anti-infectives (From admission, onward)   Start     Dose/Rate Route Frequency Ordered Stop   04/09/20 1515  Ampicillin-Sulbactam (UNASYN) 3 g in sodium chloride 0.9 % 100 mL IVPB     Discontinue     3 g 200 mL/hr over 30 Minutes Intravenous Every 6 hours 04/09/20 1511     04/08/20 1600  rifaximin (XIFAXAN) tablet 550 mg     Discontinue     550 mg Oral 2 times daily 04/08/20 1448     04/04/20 1600  cefTRIAXone (ROCEPHIN) 2 g in sodium chloride 0.9 % 100 mL IVPB  Status:  Discontinued        2 g 200 mL/hr over 30 Minutes Intravenous Every 24 hours 04/04/20 1558 04/07/20 1418     Scheduled Meds: . Chlorhexidine Gluconate Cloth  6 each Topical Daily  . feeding supplement  1 Container Oral TID BM  . folic acid  1 mg Oral Daily  . [START ON 04/12/2020] furosemide  20 mg Oral QPC breakfast  . megestrol  40 mg Oral Daily  . melatonin  5 mg Oral QHS  . multivitamin with minerals  1 tablet Oral Daily  . nicotine  21 mg Transdermal Daily  . pantoprazole  40 mg Intravenous Q12H  . predniSONE  40 mg Oral Q breakfast  . rifaximin  550 mg Oral BID  . sodium chloride flush  3 mL Intravenous Once  . [START ON 04/12/2020] spironolactone  50 mg Oral QPC breakfast  . thiamine  100 mg Oral Daily   Or  . thiamine  100 mg Intravenous Daily   Continuous Infusions: . albumin human 25 g (04/11/20 0946)  . ampicillin-sulbactam (UNASYN) IV 3 g (04/11/20 0932)   PRN Meds:.ondansetron **OR** ondansetron (ZOFRAN) IV, sodium chloride flush, traMADol   Assessment: Principal Problem:   Acute alcoholic liver disease Active Problems:   Hypokalemia   Hyponatremia   Nicotine dependence   AKI (acute kidney injury) (HCC)   Anemia   44 year old female admitted with alcoholic hepatitis, commenced on prednisone,   Plan: Alcoholic hepatitis:  Prednisone commenced on 8/5, Lille score on day 7 shows  0.1.since it is less than 0.45, indicates that  patient is responding to prednisone. Therefore recommend 3 more weeks of prednisone 40 mg daily followed by 16 days taper by 10 mg  every 4 days until down to 10 mg then decrease by 5 mg every 3 days until finished. Can check labs every other day PSE: Recommend to discontinue lactulose due to abdominal bloating, continue rifaximin 550 mg.  Switch to MiraLAX as needed for constipation High-protein diet, encourage p.o. intake Can try supplemental tube feeds with placement of Dobbhoff tube temporarily Complete abstinence from alcohol use Continue multivitamin plus thiamine plus folic acid daily Minimize administration of opioid use for pain Low-sodium diet, patient is evaluated and counseled by dietitian  AKI: Gradually improving Patient has tachycardia, appears dehydrated, BUN is improving Continue maintenance Albumin infusion daily for 3 days, day 3 today Strict ins and outs  Pneumonia, likely secondary to aspiration Currently on antibiotics for aspiration pneumonia which is reasonable given her alcoholic hepatitis and on steroids Can be switched to oral antibiotic upon discharge  Macrocytic anemia, hemoglobin is downtrending with no signs of active GI bleed We will monitor closely for now, likely combination of frequent phlebotomies, alcohol induced bone marrow supression, malnutrition, anemia of chronic disease  Alcoholic cirrhosis of liver Volume overload with ascites and swelling of legs S/p therapeutic paracentesis, no evidence of SBP.  SAAG >1.1 consistent with portal hypertension Recommend to start low-dose diuretics Lasix 20 mg and spironolactone 50 mg daily Low-sodium diet  Encourage out of bed to chair and ambulate as tolerated  GI will follow along with you    LOS: 7 days   Reesha Debes 04/11/2020, 3:40 PM

## 2020-04-11 NOTE — Evaluation (Signed)
Occupational Therapy Evaluation Patient Details Name: Jill Reyes MRN: [id number] DOB: July 14, 1976 Today's Date: 04/11/2020    History of Present Illness Per MD Notes: Andrian Reyes is a 44 y.o. female with medical history significant for alcohol abuse who presented to the ER for evaluation of weakness which has progressively worsened over the last several weeks. Patient states that she was drinking a few bottle of vodka daily but quit about 2 weeks prior to her admission.  She also complained of nausea, vomiting, anorexia, abdominal distention and yellowish discoloration of her skin and eyes.Work-up revealed acute alcoholic hepatitis probable underlying alcoholic liver cirrhosis.  She was confused and this was thought to be due to hepatic encephalopathy.  She was treated with prednisone, lactulose and rifaximin.  She also had acute kidney injury and hyponatremia that were treated with IV fluids.  She was treated with empiric IV Rocephin for abnormal urinalysis, suspected UTI.  Urine culture did not show any growth.  She was transfused with 1 unit of packed red blood cells for severe anemia.  She had hypokalemia that was corrected.Hospital course was complicated by aspiration pneumonia and acute hypoxemic respiratory failure which occurred after patient choked/aspirated while taking lactulose.  She was started on IV Unasyn for aspiration pneumonia.  She required oxygen therapy briefly for acute hypoxemic respiratory failure.   Clinical Impression   [name] was seen for OT evaluation this date. Prior to onset of weakness and hospital admission, pt was independent in all ADL/IADL management and denies using AE for functional mobility. Pt lives with her husband in a multi-level home with 1 STE. Currently pt demonstrates impairments as described below (See OT problem list) which functionally limit her ability to perform ADL/self-care tasks. Pt currently requires MIN A for LB ADL management and functional  mobility.  Pt would benefit from skilled OT services to address noted impairments and functional limitations (see below for any additional details) in order to maximize safety and independence while minimizing falls risk and caregiver burden. Upon hospital discharge, recommend HHOT to maximize pt safety and return to functional independence during meaningful occupations of daily life.      Follow Up Recommendations  Home health OT;Supervision/Assistance - 24 hour    Equipment Recommendations  3 in 1 bedside commode    Recommendations for Other Services       Precautions / Restrictions Precautions Precautions: Fall Precaution Comments: High Fall Restrictions Weight Bearing Restrictions: No      Mobility Bed Mobility               General bed mobility comments: Deferred. Pt declined 2/2 10/10 stomach pain this date.  Transfers                 General transfer comment: Deferred. Pt declined 2/2 10/10 stomach pain this date.    Balance Overall balance assessment: Mild deficits observed, not formally tested                                         ADL either performed or assessed with clinical judgement   ADL Overall ADL's : Needs assistance/impaired Eating/Feeding: Set up;Supervision/ safety;Sitting;Bed level Eating/Feeding Details (indicate cue type and reason): Pt declining OOB/EOB, currently bed-level for ADL mgt due to increased abdominal pain. Grooming: Sitting;Set up;Supervision/safety  General ADL Comments: Anticipate MIN A for LB ADL management including bathing and dressing in seated position to maximize safety. Pt declines functional mobility. Will continue to assess.     Vision Baseline Vision/History: Wears glasses (Contacts) Wears Glasses: At all times Patient Visual Report: No change from baseline       Perception     Praxis      Pertinent Vitals/Pain Pain Assessment: 0-10 Pain  Score: 10-Worst pain ever Pain Location: stomach pain Pain Descriptors / Indicators: Constant;Sore Pain Intervention(s): Limited activity within patient's tolerance;Monitored during session;Patient requesting pain meds-RN notified     Hand Dominance Right   Extremity/Trunk Assessment Upper Extremity Assessment Upper Extremity Assessment: Generalized weakness (Assessment limited, pt declines OOB/EOB activity. Will continue to monitor. Generaly AROM WFL, grip strength decreased 4/5, decreased FMC.)   Lower Extremity Assessment Lower Extremity Assessment: Generalized weakness       Communication Communication Communication: No difficulties   Cognition Arousal/Alertness: Awake/alert Behavior During Therapy: WFL for tasks assessed/performed Overall Cognitive Status: Impaired/Different from baseline Area of Impairment: Safety/judgement;Memory                     Memory: Decreased short-term memory   Safety/Judgement: Decreased awareness of deficits     General Comments: Pt generally A&O x3. Endorses short-term memory difficulty and occasionally requires re-direction to task/topic at hand. Pt endorses occasional hallucinations of shadows in room and around people. Pt aware these shadows are not actually present.   General Comments  Pt noted with significant BLE edema which holds shape of SCD devices when removed in BLE. Educated on elevation of BLE to maximize fluid return toward heart. MD in room during session and aware.    Exercises Other Exercises Other Exercises: Pt and caregiver (Husband at bedside during session) educated on role of OT in acute setting, safe use of AE/DME including 3 in 1 instructions, edema management strategies, and extensive conversation/education regarding mobility during hospitalization and importance of frequent activity to support functional performance.   Shoulder Instructions      Home Living Family/patient expects to be discharged to::  Private residence Living Arrangements: Spouse/significant other Available Help at Discharge: Family;Available 24 hours/day (Lives with spouse on one level of home, BIL lives on other level.) Type of Home: House Home Access: Stairs to enter [employer] of Steps: at least one step at front Entrance Stairs-Rails: None Home Layout: Two level (Pt and spouse live on one level with bedroom and limited kitchen access, does need to go upstairs for bathroom.) Alternate Level Stairs-Number of Steps: full flight   Bathroom Shower/Tub: Tub/shower unit;Door   [employer] Toilet: Standard     Home Equipment: None   Additional Comments: Pt plans to ask family if there is a BSC available for her to use.      Prior Functioning/Environment Level of Independence: Independent        Comments: Pt reports she was independent with all ADL/IADL management. Spouse has recently begun providing supervision for safety during bathing        OT Problem List: Decreased strength;Decreased coordination;Pain;Decreased activity tolerance;Increased edema;Impaired balance (sitting and/or standing);Decreased knowledge of use of DME or AE;Decreased safety awareness      OT Treatment/Interventions: Self-care/ADL training;Therapeutic exercise;Therapeutic activities;DME and/or AE instruction;Patient/family education;Balance training;Energy conservation    OT Goals(Current goals can be found in the care plan section) Acute Rehab OT Goals Patient Stated Goal: To have less pain OT Goal Formulation: With patient Time For Goal Achievement: 04/25/20 Potential to Achieve  Goals: Fair ADL Goals Pt Will Perform Grooming: with modified independence;with set-up;standing (c LRAD PRN for improved safety and functional independence.) Pt Will Perform Upper Body Dressing: sitting;with modified independence Pt Will Perform Lower Body Dressing: sit to/from stand;with modified independence;with adaptive equipment (c LRAD PRN  for improved safety and functional independence.) Pt Will Transfer to Toilet: bedside commode;with set-up;with supervision (c LRAD PRN for improved safety and functional independence.) Pt Will Perform Toileting - Clothing Manipulation and hygiene: sit to/from stand;with adaptive equipment;with supervision;with set-up;with caregiver independent in assisting (c LRAD PRN for improved safety and functional independence.)  OT Frequency: Min 2X/week   Barriers to D/C: Inaccessible home environment          Co-evaluation              AM-PAC OT "6 Clicks" Daily Activity     Outcome Measure Help from another person eating meals?: A Little Help from another person taking care of personal grooming?: A Little Help from another person toileting, which includes using toliet, bedpan, or urinal?: A Little Help from another person bathing (including washing, rinsing, drying)?: A Little Help from another person to put on and taking off regular upper body clothing?: A Little Help from another person to put on and taking off regular lower body clothing?: A Little 6 Click Score: 18   End of Session Equipment Utilized During Treatment: Gait belt;Rolling walker Nurse Communication: Patient requests pain meds  Activity Tolerance: Patient limited by fatigue;Patient limited by pain Patient left: in bed;with call bell/phone within reach;with bed alarm set;with family/visitor present;with SCD's reapplied  OT Visit Diagnosis: Other abnormalities of gait and mobility (R26.89);Pain Pain - Right/Left:  (both) Pain - part of body:  (abdominal pain)                Time: 8938-1017 OT Time Calculation (min): 33 min Charges:  OT General Charges $OT Visit: 1 Visit OT Evaluation $OT Eval Moderate Complexity: 1 Mod OT Treatments $Self Care/Home Management : 8-22 mins  Rockney Ghee, M.S., OTR/L Ascom: [phone] 04/11/20, 4:01 PM

## 2020-04-11 NOTE — Evaluation (Signed)
Physical Therapy Evaluation Patient Details Name: Jill Reyes MRN: [id number] DOB: 1975-11-11 Today's Date: 04/11/2020   History of Present Illness  Per MD Notes: Loretha Ure is a 44 y.o. female with medical history significant for alcohol abuse who presented to the ER for evaluation of weakness which has progressively worsened over the last several weeks. Patient states that she was drinking a few bottle of vodka daily but quit about 2 weeks prior to her admission.  She also complained of nausea, vomiting, anorexia, abdominal distention and yellowish discoloration of her skin and eyes.Work-up revealed acute alcoholic hepatitis probable underlying alcoholic liver cirrhosis.  She was confused and this was thought to be due to hepatic encephalopathy.  She was treated with prednisone, lactulose and rifaximin.  She also had acute kidney injury and hyponatremia that were treated with IV fluids.  She was treated with empiric IV Rocephin for abnormal urinalysis, suspected UTI.  Urine culture did not show any growth.  She was transfused with 1 unit of packed red blood cells for severe anemia.  She had hypokalemia that was corrected.Hospital course was complicated by aspiration pneumonia and acute hypoxemic respiratory failure which occurred after patient choked/aspirated while taking lactulose.  She was started on IV Unasyn for aspiration pneumonia.  She required oxygen therapy briefly for acute hypoxemic respiratory failure.  Clinical Impression  Pt lying in bed upon arrival to room and agreeable to PT evaluation with increased encouragement. Pt required max verbal cues and increased time for all activities. Pt performed supine <> sit with mod I for increased time and use of bedrails. Pt seated EOB x 10 minutes with supervision for safety. Pt noted to have resting HR at 110-114 bpm which increased to 120 bpm during functional mobility. Pt performed therex in bed for promotion of muscle strenghtneing and fluid  circulation. Pt presents with decreased strength, decreased endurance, and decreased mobility and requires skilled therapy to address deficits. Recommend HHPT at discharge to optimize return to PLOF and maximize functional mobility.    Follow Up Recommendations Home health PT    Equipment Recommendations  Rolling walker with 5" wheels;3in1 (PT)    Recommendations for Other Services       Precautions / Restrictions Precautions Precautions: Fall Precaution Comments: High Fall Restrictions Weight Bearing Restrictions: No      Mobility  Bed Mobility Overal bed mobility: Modified Independent             General bed mobility comments: required increased time to perform task and verbal cues on sequencing; no physical assistance required  Transfers                 General transfer comment: pt deferred stating "I think I have done all I can do today."  Ambulation/Gait             General Gait Details: not performed  Stairs            Wheelchair Mobility    Modified Rankin (Stroke Patients Only)       Balance Overall balance assessment: Needs assistance Sitting-balance support: Bilateral upper extremity supported;Feet supported Sitting balance-Leahy Scale: Good Sitting balance - Comments: supervision for safety; pt leans her head back and sits in cervical extension several times however is controlled       Standing balance comment: not attempted                             Pertinent Vitals/Pain Pain  Assessment: 0-10 Pain Score: 8  Pain Location: bilateral legs; stomach pain Pain Descriptors / Indicators: Constant;Sore;Sharp Pain Intervention(s): Monitored during session;Limited activity within patient's tolerance;Repositioned    Home Living Family/patient expects to be discharged to:: Private residence Living Arrangements: Spouse/significant other Available Help at Discharge: Family;Available 24 hours/day Type of Home:  House Home Access: Stairs to enter Entrance Stairs-Rails: None Entrance Stairs-Number of Steps: at least one step at front Home Layout: Two level Home Equipment: None Additional Comments: Pt plans to ask family if there is a BSC available for her to use.    Prior Function Level of Independence: Independent         Comments: Pt reports independence with ambulation and endorses fasll history.     Hand Dominance   Dominant Hand: Right    Extremity/Trunk Assessment   Upper Extremity Assessment Upper Extremity Assessment: Generalized weakness (grossly 3+ to 4-/5 bilaterally)    Lower Extremity Assessment Lower Extremity Assessment: Generalized weakness (grossly 3+ to 4-/g bilaterally)       Communication   Communication: No difficulties  Cognition Arousal/Alertness: Awake/alert Behavior During Therapy: WFL for tasks assessed/performed Overall Cognitive Status: Impaired/Different from baseline Area of Impairment: Safety/judgement;Memory                     Memory: Decreased short-term memory   Safety/Judgement: Decreased awareness of deficits     General Comments: Pt generally A&O x3. Endorses short-term memory difficulty and occasionally requires re-direction to task/topic at hand. Pt endorses occasional hallucinations of shadows in room and around people. Pt aware these shadows are not actually present.      General Comments General comments (skin integrity, edema, etc.): noted BLE edema (pitting edema)    Exercises Other Exercises Other Exercises: Pt and caregiver (Husband at bedside during session) educated on role of OT in acute setting, safe use of AE/DME including 3 in 1 instructions, edema management strategies, and extensive conversation/education regarding mobility during hospitalization and importance of frequent activity to support functional performance. Other Exercises: pt perfomred bridging x 3, able to scoot herself up in bed and performed hip  ABD, SLR, and heel slides x 3 bilat with decreased speed and increased effort Other Exercises: pt educated on role of PT, importance of mobility and decreasing falls risk   Assessment/Plan    PT Assessment Patient needs continued PT services  PT Problem List Decreased strength;Decreased activity tolerance;Decreased balance;Decreased mobility;Decreased coordination;Decreased cognition;Cardiopulmonary status limiting activity;Pain       PT Treatment Interventions DME instruction;Gait training;Stair training;Functional mobility training;Therapeutic activities;Therapeutic exercise;Balance training;Patient/family education    PT Goals (Current goals can be found in the Care Plan section)  Acute Rehab PT Goals Patient Stated Goal: To have less pain PT Goal Formulation: With patient Time For Goal Achievement: 04/25/20 Potential to Achieve Goals: Good    Frequency Min 2X/week   Barriers to discharge        Co-evaluation               AM-PAC PT "6 Clicks" Mobility  Outcome Measure Help needed turning from your back to your side while in a flat bed without using bedrails?: None Help needed moving from lying on your back to sitting on the side of a flat bed without using bedrails?: None Help needed moving to and from a bed to a chair (including a wheelchair)?: A Little Help needed standing up from a chair using your arms (e.g., wheelchair or bedside chair)?: A Little Help needed to walk in  hospital room?: A Lot Help needed climbing 3-5 steps with a railing? : A Lot 6 Click Score: 18    End of Session   Activity Tolerance: Patient limited by fatigue Patient left: in bed;with call bell/phone within reach;with bed alarm set;with family/visitor present;with SCD's reapplied Nurse Communication: Mobility status PT Visit Diagnosis: Unsteadiness on feet (R26.81);Muscle weakness (generalized) (M62.81);History of falling (Z91.81);Pain Pain - Right/Left:  (bilateral and midline) Pain -  part of body: Leg (stomach)    Time: 6270-3500 PT Time Calculation (min) (ACUTE ONLY): 51 min   Charges:              Frederich Chick, SPT  Frederich Chick 04/11/2020, 4:55 PM

## 2020-04-12 DIAGNOSIS — K659 Peritonitis, unspecified: Secondary | ICD-10-CM

## 2020-04-12 DIAGNOSIS — G9340 Encephalopathy, unspecified: Secondary | ICD-10-CM

## 2020-04-12 DIAGNOSIS — B379 Candidiasis, unspecified: Secondary | ICD-10-CM

## 2020-04-12 LAB — PROTEIN, BODY FLUID (OTHER): Total Protein, Body Fluid Other: 1.3 g/dL

## 2020-04-12 LAB — BASIC METABOLIC PANEL
Anion gap: 11 (ref 5–15)
BUN: 30 mg/dL — ABNORMAL HIGH (ref 6–20)
CO2: 25 mmol/L (ref 22–32)
Calcium: 8.8 mg/dL — ABNORMAL LOW (ref 8.9–10.3)
Chloride: 100 mmol/L (ref 98–111)
Creatinine, Ser: 0.99 mg/dL (ref 0.44–1.00)
GFR calc Af Amer: >60 mL/min (ref >60)
GFR calc non Af Amer: >60 mL/min (ref >60)
Glucose, Bld: 101 mg/dL — ABNORMAL HIGH (ref 70–99)
Potassium: 4 mmol/L (ref 3.5–5.1)
Sodium: 136 mmol/L (ref 135–145)

## 2020-04-12 LAB — CYTOLOGY - NON PAP

## 2020-04-12 MED ORDER — FLUCONAZOLE 100 MG PO TABS
400.0000 mg | ORAL_TABLET | Freq: Every day | ORAL | Status: DC
  Administered 2020-04-12 – 2020-04-15 (×4): 400 mg via ORAL
  Filled 2020-04-12 (×4): qty 4

## 2020-04-12 MED ORDER — FLUCONAZOLE IN SODIUM CHLORIDE 400-0.9 MG/200ML-% IV SOLN
400.0000 mg | INTRAVENOUS | Status: DC
  Filled 2020-04-12: qty 200

## 2020-04-12 MED ORDER — SODIUM CHLORIDE 0.9 % IV SOLN: INTRAVENOUS | Status: DC

## 2020-04-12 NOTE — Plan of Care (Signed)

## 2020-04-12 NOTE — Progress Notes (Signed)
PROGRESS NOTE  Jill Reyes WSF:[id number] DOB: Nov 04, 1975 DOA: 04/04/2020 PCP: Patient, No Pcp Per  Brief History   Jill Reyes is a 43 y.o. female with medical history significant foralcohol abuse who presented to the ER for evaluation of weakness which has progressively worsened over the last several weeks. Patient states that she was drinking a few bottle of vodka daily but quit about 2 weeksprior to her admission.  She also complained of nausea, vomiting, anorexia, abdominal distention and yellowish discoloration of her skin and eyes.  Work-up revealed acute alcoholic hepatitis probable underlying alcoholic liver cirrhosis.  She was confused and this was thought to be due to hepatic encephalopathy.  She was treated with prednisone, lactulose and rifaximin.  She also had acute kidney injury and hyponatremia that were treated with IV fluids.  She was treated with empiric IV Rocephin for abnormal urinalysis, suspected UTI.  Urine culture did not show any growth.  She was transfused with 1 unit of packed red blood cells for severe anemia.  She had hypokalemia that was corrected.  Hospital course was complicated by aspiration pneumonia and acute hypoxemic respiratory failure which occurred after patient choked/aspirated while taking lactulose.  She was started on IV Unasyn for aspiration pneumonia.  She required oxygen therapy briefly for acute hypoxemic respiratory failure.  Continue IV albumin  And steroids for 3 days as per GI recommendations. The patient underwent paracentesis with 1.8 L fluid removed. Ascitic fluid has grown out rare yeast. Infectious disease has been consulted. The patient has been started on diflucan 400 mg daily. I have discussed the patient with Dr. Allegra Lai. She will take the patient for EGD in the am. Consultants   Gastroenterology  Interventional radiology  Procedures   Paracentesis  Antibiotics   Anti-infectives (From admission, onward)   Start     Dose/Rate  Route Frequency Ordered Stop   04/12/20 1700  fluconazole (DIFLUCAN) tablet 400 mg     Discontinue     400 mg Oral Daily 04/12/20 1620     04/12/20 1600  fluconazole (DIFLUCAN) IVPB 400 mg  Status:  Discontinued        400 mg 100 mL/hr over 120 Minutes Intravenous Every 24 hours 04/12/20 1510 04/12/20 1620   04/09/20 1515  Ampicillin-Sulbactam (UNASYN) 3 g in sodium chloride 0.9 % 100 mL IVPB     Discontinue     3 g 200 mL/hr over 30 Minutes Intravenous Every 6 hours 04/09/20 1511 04/14/20 1759   04/08/20 1600  rifaximin (XIFAXAN) tablet 550 mg     Discontinue     550 mg Oral 2 times daily 04/08/20 1448     04/04/20 1600  cefTRIAXone (ROCEPHIN) 2 g in sodium chloride 0.9 % 100 mL IVPB  Status:  Discontinued        2 g 200 mL/hr over 30 Minutes Intravenous Every 24 hours 04/04/20 1558 04/07/20 1418     Subjective  The patient is lying in bed. No new complaints.  Objective   Vitals:  Vitals:   04/12/20 1158 04/12/20 1512  BP: 107/74 105/75  Pulse: (!) 115 (!) 108  Resp: 18 17  Temp: 98.4 F (36.9 C) 98.7 F (37.1 C)  SpO2: 97% 95%   Exam:  Constitutional:   The patient is awake, alert, and oriented x 3. No acute distress. Respiratory:   No increased work of breathing.  No wheezes, rales, or rhonchi  No tactile fremitus Cardiovascular:   Regular rate and rhythm  No murmurs, ectopy, or  gallups.  No lateral PMI. No thrills. Abdomen:   Abdomen is soft, positive for tenderness in epigastrum, slightly distended  No hernias, masses, or organomegaly  Normoactive bowel sounds.  Musculoskeletal:   No cyanosis, clubbing, or edema Skin:   No rashes, lesions, ulcers  palpation of skin: no induration or nodules Neurologic:   CN 2-12 intact  Sensation all 4 extremities intact Psychiatric:   Mental status o Mood, affect appropriate o Orientation to person, place, time   judgment and insight appear intact  I have personally reviewed the following:    Today's Data   Vitals, CMP, CBC  Body fluid cell count, chemistry  Micro Data   Urine culture (04/05/2020) - no significant growth  Body fluid culture: Growth of rare yeast   Scheduled Meds:  Chlorhexidine Gluconate Cloth  6 each Topical Daily   feeding supplement  1 Container Oral TID BM   fluconazole  400 mg Oral Daily   folic acid  1 mg Oral Daily   furosemide  20 mg Oral QPC breakfast   megestrol  40 mg Oral Daily   melatonin  5 mg Oral QHS   multivitamin with minerals  1 tablet Oral Daily   nicotine  21 mg Transdermal Daily   pantoprazole  40 mg Intravenous Q12H   polyethylene glycol  17 g Oral Daily   predniSONE  40 mg Oral Q breakfast   rifaximin  550 mg Oral BID   sodium chloride flush  3 mL Intravenous Once   spironolactone  50 mg Oral QPC breakfast   thiamine  100 mg Oral Daily   Or   thiamine  100 mg Intravenous Daily   Continuous Infusions:  ampicillin-sulbactam (UNASYN) IV 3 g (04/12/20 1711)    Principal Problem:   Acute alcoholic liver disease Active Problems:   Hypokalemia   Hyponatremia   Nicotine dependence   AKI (acute kidney injury) (HCC)   Anemia   LOS: 8 days   A & P  Acute alcoholic hepatitis, probable alcoholic liver cirrhosis with hepatic encephalopathy, coagulopathy, hypoalbuminemia: Mental status is slowly improving.  Bilirubin level continues to trend down.  Ammonia level 66 on 04/08/2020.  CT head on 04/06/2020 did not show any acute abnormality..  Continue lactulose and prednisone.  Rifaximin and IV albumin have been added. Today the  Patient's abdomen was taut and tender. She was sent for paracentesis with IR. They obtained 1.8 L. Chemistry and cell count are consistent with non-infected, transudative fluid. Cultures on the fluid have had no growth. She is receiving an albumin infusion as per GI. GI recommends discharge on 40 of prednisone daily with a slow taper and cessation of alcohol.  Yeast in ascitic fluids: I have  discussed the patient with infectious disease. She will be started on diflucan as recommended. Also Dr. Rivka Safer has recommended that the patient have a work up for possible GI bleed as perturbations in the GI mucosa could be the source of the yeast in the peritoneal ascites.  Severe anemia, reported history of recent melena: Hemoglobin was 12.6 on 01/25/2020.  S/p transfusion with 1 unit of packed red blood cells on 04/05/2020.  H&H has remained stable since transfusion..  Continue IV Protonix.  Monitor H&H. Hemoglobin 7.7 today.  Aspiration pneumonia, acute hypoxemic respiratory failure: Start IV Unasyn.  Oxygenation has improved and she's tolerating room air.  Hyponatremia: Resolving. Monitor sodium level.  Hypokalemia: Resolved. Monitor.   Hypotension: BP's remain low normal, but improved over previous. Monitor.  Acute  kidney injury: Creatinine has improved.  Discontinue IV fluids.  Abnormal urinalysis, questionable acute UTI, leukocytosis: Insignificant growth on urine culture.  She has received 3 days worth of IV Rocephin which was completed on 04/07/2020.  Thrombocytopenia: This is likely from liver disease.Monitor and avoid anticoagulation.  Alcohol use disorder: Continue thiamine, folic acid and Ativan as needed.  Debility: Patient is extremely weak.  Prognosis is poor.  I have seen and examined this patient myself. I have spent 38 minutes in her evaluation and care.  DVT prophylaxis: SCD's CODE STATUS: Full code Family Communication: Family at bedside. Disposition: Status is: Inpatient  Remains inpatient appropriate because:IV treatments appropriate due to intensity of illness or inability to take PO   Dispo: The patient is from: Home              Anticipated d/c is to: SNF              Anticipated d/c date is: 2 days              Patient currently is not medically stable to d/c.  Linford Quintela, DO [facility] Direct contact: see [email or web address]  7PM-7AM  contact night coverage as above 04/12/2020, 7:45 PM  LOS: 6 days

## 2020-04-12 NOTE — Progress Notes (Signed)
Jill Repress, MD [address]  [address]  [place], [place] [address]  Main: [phone]  Fax: [phone] Pager: [phone]   Subjective: Patient is much more alert today.  She is eating more, her abdomen is less distended.  She denies any black stools, rectal bleeding, nausea or vomiting  Objective: Vital signs in last 24 hours: Vitals:   04/12/20 0515 04/12/20 0731 04/12/20 1158 04/12/20 1512  BP: 109/78 108/76 107/74 105/75  Pulse: (!) 105 (!) 102 (!) 115 (!) 108  Resp: 19 18 18 17   Temp: 98.3 F (36.8 C) 97.8 F (36.6 C) 98.4 F (36.9 C) 98.7 F (37.1 C)  TempSrc: Oral Oral Oral Oral  SpO2: 100% 100% 97% 95%  Weight:      Height:       Weight change:   Intake/Output Summary (Last 24 hours) at 04/12/2020 1550 Last data filed at 04/12/2020 1157 Gross per 24 hour  Intake 320 ml  Output 150 ml  Net 170 ml     Exam: Heart:: tachycardic Lungs: normal and clear to auscultation Abdomen: Nontender, nondistended  Lab Results: CBC Latest Ref Rng & Units 04/11/2020 04/10/2020 04/09/2020  WBC 4.0 - 10.5 K/uL 12.5(H) 13.9(H) 15.5(H)  Hemoglobin 12.0 - 15.0 g/dL 7.7(L) 7.8(L) 8.5(L)  Hematocrit 36 - 46 % 22.8(L) 23.7(L) 25.4(L)  Platelets 150 - 400 K/uL 121(L) 122(L) 122(L)   CMP Latest Ref Rng & Units 04/12/2020 04/11/2020 04/10/2020  Glucose 70 - 99 mg/dL 06/10/2020) 063(K) 99  BUN 6 - 20 mg/dL 160(F) 09(N) 23(F)  Creatinine 0.44 - 1.00 mg/dL 57(D 2.20 2.54  Sodium 135 - 145 mmol/L 136 134(L) 134(L)  Potassium 3.5 - 5.1 mmol/L 4.0 3.5 4.2  Chloride 98 - 111 mmol/L 100 99 100  CO2 22 - 32 mmol/L 25 23 24   Calcium 8.9 - 10.3 mg/dL 2.70) ) 6.2(B)  Total Protein 6.5 - 8.1 g/dL - 5.3(L) 5.6(L)  Total Bilirubin 0.3 - 1.2 mg/dL - 12.6(H) 12.9(H)  Alkaline Phos 38 - 126 U/L - 124 139(H)  AST 15 - 41 U/L - 117(H) 163(H)  ALT 0 - 44 U/L - 60(H) 70(H)    Micro Results: Recent Results (from the past 240 hour(s))  SARS Coronavirus 2 by RT PCR (hospital order,  performed in [place] hospital lab) Nasopharyngeal Nasopharyngeal Swab     Status: None   Collection Time: 04/04/20 12:11 PM   Specimen: Nasopharyngeal Swab  Result Value Ref Range Status   SARS Coronavirus 2 NEGATIVE NEGATIVE Final    Comment: (NOTE) SARS-CoV-2 target nucleic acids are NOT DETECTED.  The SARS-CoV-2 RNA is generally detectable in upper and lower respiratory specimens during the acute phase of infection. The lowest concentration of SARS-CoV-2 viral copies this assay can detect is 250 copies / mL. A negative result does not preclude SARS-CoV-2 infection and should not be used as the sole basis for treatment or other patient management decisions.  A negative result may occur with improper specimen collection / handling, submission of specimen other than nasopharyngeal swab, presence of viral mutation(s) within the areas targeted by this assay, and inadequate number of viral copies (<250 copies / mL). A negative result must be combined with clinical observations, patient history, and epidemiological information.  Fact Sheet for Patients:   [facility]  Fact Sheet for Healthcare Providers: 06/04/20  This test is not yet approved or  cleared by the [employer] FDA and has been authorized for detection and/or diagnosis of SARS-CoV-2 by FDA under  an Emergency Use Authorization (EUA).  This EUA will remain in effect (meaning this test can be used) for the duration of the COVID-19 declaration under Section 564(b)(1) of the Act, 21 U.S.C. section 360bbb-3(b)(1), unless the authorization is terminated or revoked sooner.  Performed at [facility], [address], [place], [place] [address]   Urine Culture     Status: Abnormal   Collection Time: 04/05/20  5:40 PM   Specimen: Urine, Random  Result Value Ref Range Status   Specimen Description   Final    URINE, RANDOM Performed at [facility], [address] [place], [place] [address]    Special Requests   Final    NONE Performed at [facility], [address] [place], [place] [address]    Culture (A)  Final    <10,000 COLONIES/mL INSIGNIFICANT GROWTH Performed at [facility], [address], [place], [place] [address]    Report Status 04/07/2020 FINAL  Final  Body fluid culture     Status: None (Preliminary result)   Collection Time: 04/10/20  4:08 PM   Specimen: PATH Cytology Peritoneal fluid  Result Value Ref Range Status   Specimen Description   Final    PERITONEAL Performed at [facility], [address] [place], [place] [address]    Special Requests   Final    NONE Performed at [facility], [address] [place], [place] [address]    Gram Stain   Final    RARE WBC PRESENT, PREDOMINANTLY MONONUCLEAR NO ORGANISMS SEEN    Culture   Final    RARE YEAST CRITICAL RESULT CALLED TO, READ BACK BY AND VERIFIED WITH: RN E.MORALIS AT 0824 ON 04/12/20 BY DV Performed at [facility], [address], [place], [place] [address]    Report Status PENDING  Incomplete   Studies/Results: US Paracentesis  Result Date: 04/10/2020 INDICATION: Patient with history of alcohol abuse, alcoholic hepatitis with new onset abdominal distension. Request to IR for diagnostic and therapeutic paracentesis. EXAM: ULTRASOUND GUIDED DIAGNOSTIC AND THERAPEUTIC PARACENTESIS MEDICATIONS: 10 mL lidocaine 1% COMPLICATIONS: None immediate. PROCEDURE: Informed written consent was obtained from the patient after a discussion of the risks, benefits and alternatives to treatment. A timeout was performed prior to the initiation of the procedure. Initial ultrasound scanning demonstrates a small amount of ascites within the right lower abdominal quadrant. The right lower abdomen was prepped and draped in the usual sterile fashion. 1% lidocaine was used for local anesthesia. Following this, a 6 Fr  Safe-T-Centesis catheter was introduced. An ultrasound image was saved for documentation purposes. The paracentesis was performed. The catheter was removed and a dressing was applied. The patient tolerated the procedure well without immediate post procedural complication. FINDINGS: A total of approximately 1.35 L of clear, bright yellow fluid was removed. Samples were sent to the laboratory as requested by the clinical team. IMPRESSION: Successful ultrasound-guided paracentesis yielding 1.35 liters of peritoneal fluid. Read by Lynnette Caffey, PA-C Electronically Signed   By: Alcide Clever M.D.   On: 04/10/2020 16:39    Medications:  I have reviewed the patient's current medications. Prior to Admission:  Medications Prior to Admission  Medication Sig Dispense Refill Last Dose  . loratadine (CLARITIN) 10 MG tablet Take 1 tablet (10 mg total) by mouth daily. 30 tablet 1 Past Week at PRN  . nicotine (NICODERM CQ - DOSED IN MG/24 HOURS) 21 mg/24hr patch Place 1 patch (21 mg total) onto the  skin daily. 28 patch 0 04/03/2020 at 0800   Scheduled: . Chlorhexidine Gluconate Cloth  6 each Topical Daily  . feeding supplement  1 Container Oral TID BM  . folic acid  1 mg Oral Daily  . furosemide  20 mg Oral QPC breakfast  . megestrol  40 mg Oral Daily  . melatonin  5 mg Oral QHS  . multivitamin with minerals  1 tablet Oral Daily  . nicotine  21 mg Transdermal Daily  . pantoprazole  40 mg Intravenous Q12H  . polyethylene glycol  17 g Oral Daily  . predniSONE  40 mg Oral Q breakfast  . rifaximin  550 mg Oral BID  . sodium chloride flush  3 mL Intravenous Once  . spironolactone  50 mg Oral QPC breakfast  . thiamine  100 mg Oral Daily   Or  . thiamine  100 mg Intravenous Daily   Continuous: . ampicillin-sulbactam (UNASYN) IV 3 g (04/12/20 1024)  . fluconazole (DIFLUCAN) IV     GYI:[id number] **OR** ondansetron (ZOFRAN) IV, sodium chloride flush, traMADol Anti-infectives (From admission, onward)    Start     Dose/Rate Route Frequency Ordered Stop   04/12/20 1600  fluconazole (DIFLUCAN) IVPB 400 mg     Discontinue     400 mg 100 mL/hr over 120 Minutes Intravenous Every 24 hours 04/12/20 1510     04/09/20 1515  Ampicillin-Sulbactam (UNASYN) 3 g in sodium chloride 0.9 % 100 mL IVPB     Discontinue     3 g 200 mL/hr over 30 Minutes Intravenous Every 6 hours 04/09/20 1511 04/14/20 1759   04/08/20 1600  rifaximin (XIFAXAN) tablet 550 mg     Discontinue     550 mg Oral 2 times daily 04/08/20 1448     04/04/20 1600  cefTRIAXone (ROCEPHIN) 2 g in sodium chloride 0.9 % 100 mL IVPB  Status:  Discontinued        2 g 200 mL/hr over 30 Minutes Intravenous Every 24 hours 04/04/20 1558 04/07/20 1418     Scheduled Meds: . Chlorhexidine Gluconate Cloth  6 each Topical Daily  . feeding supplement  1 Container Oral TID BM  . folic acid  1 mg Oral Daily  . furosemide  20 mg Oral QPC breakfast  . megestrol  40 mg Oral Daily  . melatonin  5 mg Oral QHS  . multivitamin with minerals  1 tablet Oral Daily  . nicotine  21 mg Transdermal Daily  . pantoprazole  40 mg Intravenous Q12H  . polyethylene glycol  17 g Oral Daily  . predniSONE  40 mg Oral Q breakfast  . rifaximin  550 mg Oral BID  . sodium chloride flush  3 mL Intravenous Once  . spironolactone  50 mg Oral QPC breakfast  . thiamine  100 mg Oral Daily   Or  . thiamine  100 mg Intravenous Daily   Continuous Infusions: . ampicillin-sulbactam (UNASYN) IV 3 g (04/12/20 1024)  . fluconazole (DIFLUCAN) IV     PRN Meds:.ondansetron **OR** ondansetron (ZOFRAN) IV, sodium chloride flush, traMADol   Assessment: Principal Problem:   Acute alcoholic liver disease Active Problems:   Hypokalemia   Hyponatremia   Nicotine dependence   AKI (acute kidney injury) [facility])   Anemia   44 year old female admitted with alcoholic hepatitis and alcoholic cirrhosis of liver, commenced on prednisone on 8/5  Plan: Alcoholic hepatitis:  Prednisone  commenced on 8/5, Lille score on day 7 shows  0.1.since it is less than  0.45, indicates that patient is responding to prednisone. Therefore recommend 3 more weeks of prednisone 40 mg daily followed by 16 days taper by 10 mg every 4 days until down to 10 mg then decrease by 5 mg every 3 days until finished. Can check labs every other day PSE: Lactulose has been discontinued due to abdominal bloating, continue rifaximin 550 mg.  Switch to MiraLAX as needed for constipation High-protein diet, encourage p.o. intake Can try supplemental tube feeds with placement of Dobbhoff tube temporarily Complete abstinence from alcohol use Continue multivitamin plus thiamine plus folic acid daily Minimize administration of opioid use for pain Low-sodium diet, patient is evaluated and counseled by dietitian  AKI: Resolved Continue maintenance Albumin infusion daily for 5 days, day 5 today Strict ins and outs  Pneumonia, likely secondary to aspiration Currently on antibiotics for aspiration pneumonia which is reasonable given her alcoholic hepatitis and on steroids Can be switched to oral antibiotic upon discharge  Macrocytic anemia, hemoglobin is downtrending with no signs of active GI bleed Patient is growing rare yeast in ascitic fluid.  ID is concerned about GI bleed.  We can perform EGD tomorrow to evaluate for any upper GI source. Patient is agreeable for this procedure  Alcoholic cirrhosis of liver Volume overload with ascites and swelling of legs S/p therapeutic paracentesis, no evidence of SBP.  SAAG >1.1 consistent with portal hypertension Continue Lasix 20 mg and spironolactone 50 mg daily Low-sodium diet  Encourage out of bed to chair and ambulate as tolerated  GI will follow along with you.  Dr. Servando SnareWohl will cover for the weekend    LOS: 8 days   Karalee Hauter 04/12/2020, 3:50 PM

## 2020-04-12 NOTE — Consult Note (Addendum)
NAMEHonestee Reyes  DOB: 1976-02-12  MRN: [id number]  Date/Time: 04/12/2020 3:50 PM  REQUESTING PROVIDER: Dr. Gerri Lins Subjective:  REASON FOR CONSULT: yeast in ascitic fluid? Jill Reyes is a 44 y.o. female with a history of  Alcohol abuse,  presents to the hospital on 04/04/2020 with not feeling well for the past 2 weeks with no appetite nausea vomiting and having abdominal distention with jaundice and skin color changes. In the ED vitals were 93/48, 97.9, HR 101, Pulse ox 101 Labs revealed sodium of 119, potassium of 2.6, BUN of 50, creatinine of 1.5 and total bilirubin of 20.1.  Hemoglobin was 7.9 and platelet count was 124 and white count was 13.7.  Patient was recently in the hospital between4/23/2021 until 12/25/2019 with perianal abscess.  During that hospitalization she was found to have hyponatremia, hypokalemia and also elevated AST and folate deficiency thought to be due to alcohol excess.  She underwent fourth degree internal hemorrhoidectomy and incision and drainage of perianal abscess by Dr. Tonna Boehringer on 12/25/2019.  Marland Kitchen  Readmitted in May for tachycardia and SIRS-like presentation and was found to have elevated D-dimer but work-up was negative for PE and DVT.  She had hypokalemia and hypomagnesemia and protein calorie malnutrition.    PMH Hyponatremia Hypokalemia   Past Surgical History:  Procedure Laterality Date  . CARPAL TUNNEL RELEASE    . CESAREAN SECTION    . HEMORRHOID SURGERY N/A 12/25/2019   Procedure: HEMORRHOIDECTOMY;  Surgeon: Sung Amabile, DO;  Location: [facility] ORS;  Service: General;  Laterality: N/A;  . INCISION AND DRAINAGE PERIRECTAL ABSCESS N/A 12/25/2019   Procedure: IRRIGATION AND DEBRIDEMENT PERIRECTAL ABSCESS;  Surgeon: Sung Amabile, DO;  Location: [facility] ORS;  Service: General;  Laterality: N/A;  . TUBAL LIGATION      Social History   Socioeconomic History  . Marital status: Married    Spouse name: Not on file  . Number of children: Not on file  . Years  of education: Not on file  . Highest education level: Not on file  Occupational History  . Not on file  Tobacco Use  . Smoking status: Current Every Day Smoker    Types: Cigarettes  . Smokeless tobacco: Never Used  Substance and Sexual Activity  . Alcohol use: Yes    Comment: dailhy drinker, 2-3 beers a day, was a former liquor drinker  . Drug use: Not Currently  . Sexual activity: Not on file  Other Topics Concern  . Not on file  Social History Narrative  . Not on file   Social Determinants of Health   Financial Resource Strain:   . Difficulty of Paying Living Expenses:   Food Insecurity:   . Worried About Programme researcher, broadcasting/film/video in the Last Year:   . Barista in the Last Year:   Transportation Needs:   . Freight forwarder (Medical):   Marland Kitchen Lack of Transportation (Non-Medical):   Physical Activity:   . Days of Exercise per Week:   . Minutes of Exercise per Session:   Stress:   . Feeling of Stress :   Social Connections:   . Frequency of Communication with Friends and Family:   . Frequency of Social Gatherings with Friends and Family:   . Attends Religious Services:   . Active Member of Clubs or Organizations:   . Attends Banker Meetings:   Marland Kitchen Marital Status:   Intimate Partner Violence:   . Fear of Current or Ex-Partner:   . Emotionally  Abused:   Marland Kitchen. Physically Abused:   . Sexually Abused:     No family history on file. Allergies  Allergen Reactions  . Acetaminophen Shortness Of Breath    ? Current Facility-Administered Medications  Medication Dose Route Frequency Provider Last Rate Last Admin  . Ampicillin-Sulbactam (UNASYN) 3 g in sodium chloride 0.9 % 100 mL IVPB  3 g Intravenous Q6H Ronnald Rampatel, Kishan S, RPH 200 mL/hr at 04/12/20 1024 3 g at 04/12/20 1024  . Chlorhexidine Gluconate Cloth 2 % PADS 6 each  6 each Topical Daily Agbata, Tochukwu, MD   6 each at 04/12/20 1026  . feeding supplement (BOOST / RESOURCE BREEZE) liquid 1 Container  1  Container Oral TID BM Swayze, Ava, DO   1 Container at 04/12/20 1031  . fluconazole (DIFLUCAN) IVPB 400 mg  400 mg Intravenous Q24H Swayze, Ava, DO      . folic acid (FOLVITE) tablet 1 mg  1 mg Oral Daily Agbata, Tochukwu, MD   1 mg at 04/12/20 1025  . furosemide (LASIX) tablet 20 mg  20 mg Oral QPC breakfast Toney ReilVanga, Rohini Reddy, MD   20 mg at 04/12/20 1024  . megestrol (MEGACE) tablet 40 mg  40 mg Oral Daily Swayze, Ava, DO   40 mg at 04/12/20 1025  . melatonin tablet 5 mg  5 mg Oral QHS Tressie EllisChappell, Alex B, RPH   5 mg at 04/11/20 2140  . multivitamin with minerals tablet 1 tablet  1 tablet Oral Daily Agbata, Tochukwu, MD   1 tablet at 04/12/20 1025  . nicotine (NICODERM CQ - dosed in mg/24 hours) patch 21 mg  21 mg Transdermal Daily Agbata, Tochukwu, MD   21 mg at 04/12/20 1029  . ondansetron (ZOFRAN) tablet 4 mg  4 mg Oral Q6H PRN Agbata, Tochukwu, MD       Or  . ondansetron (ZOFRAN) injection 4 mg  4 mg Intravenous Q6H PRN Agbata, Tochukwu, MD      . pantoprazole (PROTONIX) injection 40 mg  40 mg Intravenous Q12H Willy Eddyobinson, Patrick, MD   40 mg at 04/12/20 1025  . polyethylene glycol (MIRALAX / GLYCOLAX) packet 17 g  17 g Oral Daily Swayze, Ava, DO   17 g at 04/12/20 1026  . predniSONE (DELTASONE) tablet 40 mg  40 mg Oral Q breakfast Agbata, Tochukwu, MD   40 mg at 04/12/20 1025  . rifaximin (XIFAXAN) tablet 550 mg  550 mg Oral BID Toney ReilVanga, Rohini Reddy, MD   550 mg at 04/12/20 1025  . sodium chloride flush (NS) 0.9 % injection 10-40 mL  10-40 mL Intracatheter PRN Agbata, Tochukwu, MD   10 mL at 04/09/20 1145  . sodium chloride flush (NS) 0.9 % injection 3 mL  3 mL Intravenous Once Willy Eddyobinson, Patrick, MD      . spironolactone (ALDACTONE) tablet 50 mg  50 mg Oral QPC breakfast Toney ReilVanga, Rohini Reddy, MD   50 mg at 04/12/20 1024  . thiamine tablet 100 mg  100 mg Oral Daily Agbata, Tochukwu, MD   100 mg at 04/12/20 1025   Or  . thiamine (B-1) injection 100 mg  100 mg Intravenous Daily Agbata, Tochukwu, MD    100 mg at 04/06/20 1126  . traMADol (ULTRAM) tablet 50 mg  50 mg Oral TID PRN Toney ReilVanga, Rohini Reddy, MD   50 mg at 04/12/20 0409     Abtx:  Anti-infectives (From admission, onward)   Start     Dose/Rate Route Frequency Ordered Stop   04/12/20 1600  fluconazole (DIFLUCAN) IVPB 400 mg     Discontinue     400 mg 100 mL/hr over 120 Minutes Intravenous Every 24 hours 04/12/20 1510     04/09/20 1515  Ampicillin-Sulbactam (UNASYN) 3 g in sodium chloride 0.9 % 100 mL IVPB     Discontinue     3 g 200 mL/hr over 30 Minutes Intravenous Every 6 hours 04/09/20 1511 04/14/20 1759   04/08/20 1600  rifaximin (XIFAXAN) tablet 550 mg     Discontinue     550 mg Oral 2 times daily 04/08/20 1448     04/04/20 1600  cefTRIAXone (ROCEPHIN) 2 g in sodium chloride 0.9 % 100 mL IVPB  Status:  Discontinued        2 g 200 mL/hr over 30 Minutes Intravenous Every 24 hours 04/04/20 1558 04/07/20 1418      REVIEW OF SYSTEMS:  Const: negative fever, negative chills, negative weight loss Eyes: negative diplopia or visual changes, negative eye pain ENT: negative coryza, negative sore throat Resp: negative cough, hemoptysis, had dyspnea Cards: negative for chest pain, palpitations, has  lower extremity edema GU: negative for frequency, dysuria and hematuria GI: abdominal tightness Skin: negative for rash and pruritus Heme: negative for easy bruising and gum/nose bleeding MS: generalized  weakness Neurolo:negative for headaches, dizziness, vertigo, memory problems  Psych:  anxiety Endocrine: negative for thyroid, diabetes Allergy/Immunology- acetaminophen Objective:  VITALS:  BP 105/75 (BP Location: Left Arm)   Pulse (!) 108   Temp 98.7 F (37.1 C) (Oral)   Resp 17   Ht 5\' 8"  (1.727 m)   Wt 52.2 kg   LMP 03/28/2020 (Approximate)   SpO2 95%   BMI 17.49 kg/m  PHYSICAL EXAM:  General: Alert, cooperative, no distress, appears stated age.  Head: Normocephalic, without obvious abnormality, atraumatic. Eyes:  Conjunctivae clear, icteric sclerae. Pupils are equal ENT Nares normal. No drainage or sinus tenderness. Lips, mucosa, and tongue normal. No Thrush Neck: Supple, symmetrical, no adenopathy, thyroid: non tender no carotid bruit and no JVD. Back: No CVA tenderness. Lungs: b/la ir entry Decreased bases Heart: Regular rate and rhythm, no murmur, rub or gallop. Abdomen: distended.  Extremities: edema legs Skin: No rashes or lesions. Or bruising Lymph: Cervical, supraclavicular normal. Neurologic: Grossly non-focal Pertinent Labs Lab Results    CBC    Component Value Date/Time   WBC 12.5 (H) 04/11/2020 0705   RBC 2.20 (L) 04/11/2020 0705   HGB 7.7 (L) 04/11/2020 0705   HCT 22.8 (L) 04/11/2020 0705   PLT 121 (L) 04/11/2020 0705   MCV 103.6 (H) 04/11/2020 0705   MCH 35.0 (H) 04/11/2020 0705   MCHC 33.8 04/11/2020 0705   RDW 19.4 (H) 04/11/2020 0705   LYMPHSABS 1.5 04/11/2020 0705   MONOABS 0.6 04/11/2020 0705   EOSABS 0.1 04/11/2020 0705   BASOSABS 0.0 04/11/2020 0705    CMP Latest Ref Rng & Units 04/12/2020 04/11/2020 04/10/2020  Glucose 70 - 99 mg/dL 06/10/2020) 024(O) 99  BUN 6 - 20 mg/dL 973(Z) 32(D) 92(E)  Creatinine 0.44 - 1.00 mg/dL 26(S 3.41 9.62  Sodium 135 - 145 mmol/L 136 134(L) 134(L)  Potassium 3.5 - 5.1 mmol/L 4.0 3.5 4.2  Chloride 98 - 111 mmol/L 100 99 100  CO2 22 - 32 mmol/L 25 23 24   Calcium 8.9 - 10.3 mg/dL 2.29) ) 7.9(G)  Total Protein 6.5 - 8.1 g/dL - 5.3(L) 5.6(L)  Total Bilirubin 0.3 - 1.2 mg/dL - 12.6(H) 12.9(H)  Alkaline Phos 38 - 126 U/L - 124  139(H)  AST 15 - 41 U/L - 117(H) 163(H)  ALT 0 - 44 U/L - 60(H) 70(H)      Microbiology: Recent Results (from the past 240 hour(s))  SARS Coronavirus 2 by RT PCR (hospital order, performed in [facility] hospital lab) Nasopharyngeal Nasopharyngeal Swab     Status: None   Collection Time: 04/04/20 12:11 PM   Specimen: Nasopharyngeal Swab  Result Value Ref Range Status   SARS Coronavirus 2 NEGATIVE  NEGATIVE Final    Comment: (NOTE) SARS-CoV-2 target nucleic acids are NOT DETECTED.  The SARS-CoV-2 RNA is generally detectable in upper and lower respiratory specimens during the acute phase of infection. The lowest concentration of SARS-CoV-2 viral copies this assay can detect is 250 copies / mL. A negative result does not preclude SARS-CoV-2 infection and should not be used as the sole basis for treatment or other patient management decisions.  A negative result may occur with improper specimen collection / handling, submission of specimen other than nasopharyngeal swab, presence of viral mutation(s) within the areas targeted by this assay, and inadequate number of viral copies (<250 copies / mL). A negative result must be combined with clinical observations, patient history, and epidemiological information.  Fact Sheet for Patients:   BoilerBrush.com.cy  Fact Sheet for Healthcare Providers: https://pope.com/  This test is not yet approved or  cleared by the [place] FDA and has been authorized for detection and/or diagnosis of SARS-CoV-2 by FDA under an Emergency Use Authorization (EUA).  This EUA will remain in effect (meaning this test can be used) for the duration of the COVID-19 declaration under Section 564(b)(1) of the Act, 21 U.S.C. section 360bbb-3(b)(1), unless the authorization is terminated or revoked sooner.  Performed at [facility], [address] [place], [place] [address]   Urine Culture     Status: Abnormal   Collection Time: 04/05/20  5:40 PM   Specimen: Urine, Random  Result Value Ref Range Status   Specimen Description   Final    URINE, RANDOM Performed at [facility], [address] [place], [place] [address]    Special Requests   Final    NONE Performed at [facility], [address] [place], [place] [address]    Culture (A)  Final    <10,000 COLONIES/mL  INSIGNIFICANT GROWTH Performed at [facility], [address], [place], [place] [address]    Report Status 04/07/2020 FINAL  Final  Body fluid culture     Status: None (Preliminary result)   Collection Time: 04/10/20  4:08 PM   Specimen: PATH Cytology Peritoneal fluid  Result Value Ref Range Status   Specimen Description   Final    PERITONEAL Performed at [facility], [address], [place], [place] [address]    Special Requests   Final    NONE Performed at [facility], [address] [place], [place] [address]    Gram Stain   Final    RARE WBC PRESENT, PREDOMINANTLY MONONUCLEAR NO ORGANISMS SEEN    Culture   Final    RARE YEAST CRITICAL RESULT CALLED TO, READ BACK BY AND VERIFIED WITH: RN E.MORALIS AT 0824 ON 04/12/20 BY DV Performed at [facility], [address], [place], [place] [address]    Report Status PENDING  Incomplete    IMAGING RESULTS: CT head without contrast no acute changes. Chest x-ray: Right basilar atelectasis.   I have personally reviewed the films ? Impression/Recommendation  ?  Acute alcoholic liver disease started on prednisone ?Acute hepatitis secondary to alcohol  Severe hyponatremia causing encephalopathy- resolved  Anemia: hemoglobin drop of 5 g in 3 months  Tense ascites status post paracentesis cell count was normal but rare yeast was growing in the culture. This is likely candida  We need to treat this like an infection candida peritonitis because of possibility of translocation of yeast from the intestines into the abdomen and especially with possible GI bleed secondary to a drop in hemoglobin.  We will put her on PO fluconazole for 7-10 days. ___________________________________________________ Discussed with patient, requesting provider Note:  This document was prepared using Dragon voice recognition software and may include unintentional dictation errors.

## 2020-04-13 ENCOUNTER — Encounter
Admission: EM | Disposition: A | Discharge status: Home Health | Payer: Self-pay | Source: Home / Self Care | Attending: Internal Medicine

## 2020-04-13 ENCOUNTER — Encounter: Payer: Self-pay | Admitting: Internal Medicine

## 2020-04-13 ENCOUNTER — Inpatient Hospital Stay: Payer: Medicaid Other | Admitting: Anesthesiology

## 2020-04-13 DIAGNOSIS — K7460 Unspecified cirrhosis of liver: Secondary | ICD-10-CM

## 2020-04-13 DIAGNOSIS — K7290 Hepatic failure, unspecified without coma: Secondary | ICD-10-CM

## 2020-04-13 DIAGNOSIS — I8510 Secondary esophageal varices without bleeding: Secondary | ICD-10-CM

## 2020-04-13 DIAGNOSIS — K766 Portal hypertension: Secondary | ICD-10-CM

## 2020-04-13 DIAGNOSIS — K7030 Alcoholic cirrhosis of liver without ascites: Secondary | ICD-10-CM

## 2020-04-13 HISTORY — PX: ESOPHAGOGASTRODUODENOSCOPY (EGD) WITH PROPOFOL: SHX5813

## 2020-04-13 LAB — POCT PREGNANCY, URINE: Preg Test, Ur: NEGATIVE

## 2020-04-13 SURGERY — ESOPHAGOGASTRODUODENOSCOPY (EGD) WITH PROPOFOL
Anesthesia: General

## 2020-04-13 MED ORDER — LIDOCAINE HCL (CARDIAC) PF 100 MG/5ML IV SOSY
PREFILLED_SYRINGE | INTRAVENOUS | Status: DC | PRN
  Administered 2020-04-13: 100 mg via INTRAVENOUS

## 2020-04-13 MED ORDER — PROPOFOL 500 MG/50ML IV EMUL
INTRAVENOUS | Status: DC | PRN
  Administered 2020-04-13: 50 ug/kg/min via INTRAVENOUS

## 2020-04-13 NOTE — Progress Notes (Signed)
PROGRESS NOTE  Jill Reyes WJX:[id number] DOB: [birth year] DOA: 04/04/2020 PCP: Patient, No Pcp Per  Brief History   Jill Reyes is a 44 y.o. female with medical history significant foralcohol abuse who presented to the ER for evaluation of weakness which has progressively worsened over the last several weeks. Patient states that she was drinking a few bottle of vodka daily but quit about 2 weeksprior to her admission.  She also complained of nausea, vomiting, anorexia, abdominal distention and yellowish discoloration of her skin and eyes.  Work-up revealed acute alcoholic hepatitis probable underlying alcoholic liver cirrhosis.  She was confused and this was thought to be due to hepatic encephalopathy.  She was treated with prednisone, lactulose and rifaximin.  She also had acute kidney injury and hyponatremia that were treated with IV fluids.  She was treated with empiric IV Rocephin for abnormal urinalysis, suspected UTI.  Urine culture did not show any growth.  She was transfused with 1 unit of packed red blood cells for severe anemia.  She had hypokalemia that was corrected.  Hospital course was complicated by aspiration pneumonia and acute hypoxemic respiratory failure which occurred after patient choked/aspirated while taking lactulose.  She was started on IV Unasyn for aspiration pneumonia.  She required oxygen therapy briefly for acute hypoxemic respiratory failure.  Continue IV albumin  And steroids for 3 days as per GI recommendations. The patient underwent paracentesis with 1.8 L fluid removed. Ascitic fluid has grown out rare yeast. Infectious disease has been consulted. The patient has been started on diflucan 400 mg daily. I discussed the patient with gastroenterology.  Consultants   Gastroenterology  Interventional radiology  Procedures   Paracentesis  Antibiotics   Anti-infectives (From admission, onward)   Start     Dose/Rate Route Frequency Ordered Stop   04/12/20  1700  fluconazole (DIFLUCAN) tablet 400 mg     Discontinue     400 mg Oral Daily 04/12/20 1620     04/12/20 1600  fluconazole (DIFLUCAN) IVPB 400 mg  Status:  Discontinued        400 mg 100 mL/hr over 120 Minutes Intravenous Every 24 hours 04/12/20 1510 04/12/20 1620   04/09/20 1515  Ampicillin-Sulbactam (UNASYN) 3 g in sodium chloride 0.9 % 100 mL IVPB     Discontinue     3 g 200 mL/hr over 30 Minutes Intravenous Every 6 hours 04/09/20 1511 04/14/20 1759   04/08/20 1600  rifaximin (XIFAXAN) tablet 550 mg     Discontinue     550 mg Oral 2 times daily 04/08/20 1448     04/04/20 1600  cefTRIAXone (ROCEPHIN) 2 g in sodium chloride 0.9 % 100 mL IVPB  Status:  Discontinued        2 g 200 mL/hr over 30 Minutes Intravenous Every 24 hours 04/04/20 1558 04/07/20 1418     Subjective  The patient is lying in bed. No new complaints.  Objective   Vitals:  Vitals:   04/13/20 1600 04/13/20 1633  BP: 107/76 105/79  Pulse: (!) 102 (!) 104  Resp:  18  Temp:  98.5 F (36.9 C)  SpO2: 97% 98%   Exam:  Constitutional:   The patient is awake, alert, and oriented x 3. No acute distress. Respiratory:   No increased work of breathing.  No wheezes, rales, or rhonchi  No tactile fremitus Cardiovascular:   Regular rate and rhythm  No murmurs, ectopy, or gallups.  No lateral PMI. No thrills. Abdomen:   Abdomen is soft, positive  for tenderness in epigastrum, slightly distended  No hernias, masses, or organomegaly  Normoactive bowel sounds.  Musculoskeletal:   No cyanosis, clubbing, or edema Skin:   No rashes, lesions, ulcers  palpation of skin: no induration or nodules Neurologic:   CN 2-12 intact  Sensation all 4 extremities intact Psychiatric:   Mental status o Mood, affect appropriate o Orientation to person, place, time   judgment and insight appear intact  I have personally reviewed the following:   Today's Data   Vitals, CMP, CBC  Body fluid cell count,  chemistry  Micro Data   Urine culture (04/05/2020) - no significant growth  Body fluid culture: Growth of rare yeast   Scheduled Meds:  Chlorhexidine Gluconate Cloth  6 each Topical Daily   feeding supplement  1 Container Oral TID BM   fluconazole  400 mg Oral Daily   folic acid  1 mg Oral Daily   furosemide  20 mg Oral QPC breakfast   megestrol  40 mg Oral Daily   melatonin  5 mg Oral QHS   multivitamin with minerals  1 tablet Oral Daily   nicotine  21 mg Transdermal Daily   pantoprazole  40 mg Intravenous Q12H   polyethylene glycol  17 g Oral Daily   predniSONE  40 mg Oral Q breakfast   rifaximin  550 mg Oral BID   sodium chloride flush  3 mL Intravenous Once   spironolactone  50 mg Oral QPC breakfast   thiamine  100 mg Oral Daily   Or   thiamine  100 mg Intravenous Daily   Continuous Infusions:  ampicillin-sulbactam (UNASYN) IV Stopped (04/13/20 1237)    Principal Problem:   Acute alcoholic liver disease Active Problems:   Hypokalemia   Hyponatremia   Nicotine dependence   AKI (acute kidney injury) (HCC)   Anemia   Esophageal varices in alcoholic cirrhosis (HCC)   Portal hypertension (HCC)   LOS: 9 days   A & P  Acute alcoholic hepatitis, probable alcoholic liver cirrhosis with hepatic encephalopathy, coagulopathy, hypoalbuminemia: Mental status is slowly improving.  Bilirubin level continues to trend down.  Ammonia level 66 on 04/08/2020.  CT head on 04/06/2020 did not show any acute abnormality..  Continue lactulose and prednisone.  Rifaximin and IV albumin have been added. Today the  Patient's abdomen was taut and tender. She was sent for paracentesis with IR. They obtained 1.8 L. Chemistry and cell count are consistent with non-infected, transudative fluid. Cultures on the fluid have had no growth. She is receiving an albumin infusion as per GI. GI recommends discharge on 40 of prednisone daily with a slow taper and cessation of alcohol.  Yeast in  ascitic fluids: I have discussed the patient with infectious disease. She will be started on diflucan as recommended. Also Dr. Rivka Safer has recommended that the patient have a work up for possible GI bleed as perturbations in the GI mucosa could be the source of the yeast in the peritoneal ascites. This was done on 04/13/2020. It demonstrated grade I esophageal varices, and mild portal gastropathy. She is receiving diflucan 400 mg daily as per infectious disease's recommendation.  Severe anemia, reported history of recent melena: Hemoglobin was 12.6 on 01/25/2020.  S/p transfusion with 1 unit of packed red blood cells on 04/05/2020.  H&H has remained stable since transfusion..  Continue IV Protonix.  Monitor H&H. Hemoglobin 7.7 today.  Aspiration pneumonia, acute hypoxemic respiratory failure: Start IV Unasyn.  Oxygenation has improved and she's tolerating room  air.  Hyponatremia: Resolving. Monitor sodium level.  Hypokalemia: Resolved. Monitor.   Hypotension: BP's remain low normal, but improved over previous. Monitor.  Acute kidney injury: Creatinine has improved.  Discontinue IV fluids.  Abnormal urinalysis, questionable acute UTI, leukocytosis: Insignificant growth on urine culture.  She has received 3 days worth of IV Rocephin which was completed on 04/07/2020.  Thrombocytopenia: This is likely from liver disease.Monitor and avoid anticoagulation.  Alcohol use disorder: Continue thiamine, folic acid and Ativan as needed.  Debility: Patient is extremely weak.  Prognosis is poor.  I have seen and examined this patient myself. I have spent 32 minutes in her evaluation and care.  DVT prophylaxis: SCD's CODE STATUS: Full code Family Communication: Family at bedside. Disposition: Status is: Inpatient  Remains inpatient appropriate because:IV treatments appropriate due to intensity of illness or inability to take PO   Dispo: The patient is from: Home              Anticipated  d/c is to: SNF              Anticipated d/c date is: 2 days              Patient currently is not medically stable to d/c.  Macaulay Reicher, DO [facility] Direct contact: see [email or web address]  7PM-7AM contact night coverage as above 04/13/2020, 5:35 PM  LOS: 6 days

## 2020-04-13 NOTE — Anesthesia Preprocedure Evaluation (Addendum)
Anesthesia Evaluation  Patient identified by MRN, date of birth, ID band Patient awake    Reviewed: Allergy & Precautions, H&P , NPO status , reviewed documented beta blocker date and time   Airway Mallampati: II  TM Distance: >3 FB Neck ROM: full    Dental  (+) Caps   Pulmonary Current Smoker and Patient abstained from smoking.,  Room air sats 98-99%   Pulmonary exam normal        Cardiovascular Normal cardiovascular exam     Neuro/Psych    GI/Hepatic neg GERD  ,States no longer N/V   Endo/Other    Renal/GU Renal disease     Musculoskeletal   Abdominal   Peds  Hematology  (+) Blood dyscrasia, anemia ,   Anesthesia Other Findings History reviewed. No pertinent past medical history.  Past Surgical History: No date: CARPAL TUNNEL RELEASE No date: CESAREAN SECTION 12/25/2019: HEMORRHOID SURGERY; N/A     Comment:  Procedure: HEMORRHOIDECTOMY;  Surgeon: Sung Amabile, DO;              Location: [facility] ORS;  Service: General;  Laterality: N/A; 12/25/2019: INCISION AND DRAINAGE PERIRECTAL ABSCESS; N/A     Comment:  Procedure: IRRIGATION AND DEBRIDEMENT PERIRECTAL               ABSCESS;  Surgeon: Sung Amabile, DO;  Location: [facility] ORS;              Service: General;  Laterality: N/A; No date: TUBAL LIGATION  BMI    Body Mass Index: 17.49 kg/m      Reproductive/Obstetrics                            Anesthesia Physical Anesthesia Plan  ASA: III and emergent  Anesthesia Plan: General   Post-op Pain Management:    Induction: Intravenous  PONV Risk Score and Plan: 2 and Treatment may vary due to age or medical condition and TIVA  Airway Management Planned: Nasal Cannula and Natural Airway  Additional Equipment:   Intra-op Plan:   Post-operative Plan:   Informed Consent: I have reviewed the patients History and Physical, chart, labs and discussed the procedure including the risks,  benefits and alternatives for the proposed anesthesia with the patient or authorized representative who has indicated his/her understanding and acceptance.     Dental Advisory Given  Plan Discussed with: CRNA  Anesthesia Plan Comments:         Anesthesia Quick Evaluation

## 2020-04-13 NOTE — Transfer of Care (Signed)
Immediate Anesthesia Transfer of Care Note  Patient: Jill Reyes  Procedure(s) Performed: ESOPHAGOGASTRODUODENOSCOPY (EGD) WITH PROPOFOL (N/A )  Patient Location: PACU and Endoscopy Unit  Anesthesia Type:MAC  Level of Consciousness: sedated  Airway & Oxygen Therapy: Patient Spontanous Breathing and Patient connected to nasal cannula oxygen  Post-op Assessment: Report given to RN and Post -op Vital signs reviewed and stable  Post vital signs: Reviewed and stable  Last Vitals:  Vitals Value Taken Time  BP 103/73   Temp    Pulse 103   Resp 12   SpO2 98     Last Pain:  Vitals:   04/13/20 1539  TempSrc:   PainSc: Asleep         Complications: No complications documented.

## 2020-04-13 NOTE — Plan of Care (Signed)
  Problem: Education: Goal: Knowledge of General Education information will improve Description Including pain rating scale, medication(s)/side effects and non-pharmacologic comfort measures Outcome: Progressing   

## 2020-04-13 NOTE — Op Note (Signed)
[facility] Gastroenterology Patient Name: Jill Reyes Procedure Date: 04/13/2020 3:16 PM MRN: [id number] Account #: [id number] Date of Birth: [birth year] Admit Type: Inpatient Age: 44 Room: [facility] ENDO ROOM 4 Gender: Female Note Status: Finalized Procedure:             Upper GI endoscopy Indications:           Cirrhosis with suspected esophageal varices Providers:             Midge Minium MD, MD Medicines:             Propofol per Anesthesia Complications:         No immediate complications. Procedure:             Pre-Anesthesia Assessment:                        - Prior to the procedure, a History and Physical was                         performed, and patient medications and allergies were                         reviewed. The patient's tolerance of previous                         anesthesia was also reviewed. The risks and benefits                         of the procedure and the sedation options and risks                         were discussed with the patient. All questions were                         answered, and informed consent was obtained. Prior                         Anticoagulants: The patient has taken no previous                         anticoagulant or antiplatelet agents. ASA Grade                         Assessment: III - A patient with severe systemic                         disease. After reviewing the risks and benefits, the                         patient was deemed in satisfactory condition to                         undergo the procedure.                        After obtaining informed consent, the endoscope was                         passed under direct vision. Throughout the procedure,  the patient's blood pressure, pulse, and oxygen                         saturations were monitored continuously. The Endoscope                         was introduced through the mouth, and advanced to the                          second part of duodenum. The upper GI endoscopy was                         accomplished without difficulty. The patient tolerated                         the procedure well. Findings:      Grade I varices were found in the lower third of the esophagus.      Mild portal hypertensive gastropathy was found in the entire examined       stomach.      The examined duodenum was normal. Impression:            - Grade I esophageal varices.                        - Portal hypertensive gastropathy.                        - Normal examined duodenum.                        - No specimens collected. Recommendation:        - Return patient to hospital ward for ongoing care.                        - Resume regular diet.                        - Continue present medications. Procedure Code(s):     --- Professional ---                        [phone], Esophagogastroduodenoscopy, flexible,                         transoral; diagnostic, including collection of                         specimen(s) by brushing or washing, when performed                         (separate procedure) Diagnosis Code(s):     --- Professional ---                        I85.10, Secondary esophageal varices without bleeding                        K76.6, Portal hypertension CPT copyright 2019 American Medical Association. All rights reserved. The codes documented in this report are preliminary and upon coder review may  be revised to meet current compliance requirements. Midge Minium MD, MD 04/13/2020  3:42:52 PM This report has been signed electronically. Number of Addenda: 0 Note Initiated On: 04/13/2020 3:16 PM Estimated Blood Loss:  Estimated blood loss: none.      [facility]

## 2020-04-13 NOTE — Progress Notes (Signed)
Patient taken off floor to Endo. On RA, no distress noted.

## 2020-04-14 DIAGNOSIS — K7200 Acute and subacute hepatic failure without coma: Secondary | ICD-10-CM

## 2020-04-14 DIAGNOSIS — R17 Unspecified jaundice: Secondary | ICD-10-CM

## 2020-04-14 LAB — BODY FLUID CULTURE

## 2020-04-14 MED ORDER — ADULT MULTIVITAMIN W/MINERALS CH: 1.0000 | ORAL_TABLET | Freq: Every day | ORAL | 0 refills | Status: AC

## 2020-04-14 MED ORDER — THIAMINE HCL 100 MG PO TABS: 100.0000 mg | ORAL_TABLET | Freq: Every day | ORAL | 0 refills | Status: AC

## 2020-04-14 MED ORDER — TRAMADOL HCL 50 MG PO TABS: 50.0000 mg | ORAL_TABLET | Freq: Three times a day (TID) | ORAL | 0 refills | Status: DC | PRN

## 2020-04-14 MED ORDER — POLYETHYLENE GLYCOL 3350 17 G PO PACK: 17.0000 g | PACK | Freq: Every day | ORAL | 0 refills | Status: AC

## 2020-04-14 MED ORDER — RIFAXIMIN 550 MG PO TABS: 550.0000 mg | ORAL_TABLET | Freq: Two times a day (BID) | ORAL | 0 refills | Status: AC

## 2020-04-14 MED ORDER — FLUCONAZOLE 200 MG PO TABS: 400.0000 mg | ORAL_TABLET | Freq: Every day | ORAL | 0 refills | Status: DC

## 2020-04-14 MED ORDER — PREDNISONE 20 MG PO TABS: ORAL_TABLET | ORAL | 0 refills | Status: AC

## 2020-04-14 MED ORDER — MEGESTROL ACETATE 40 MG PO TABS: 40.0000 mg | ORAL_TABLET | Freq: Every day | ORAL | 0 refills | Status: DC

## 2020-04-14 MED ORDER — TRAMADOL HCL 50 MG PO TABS: 50.0000 mg | ORAL_TABLET | Freq: Three times a day (TID) | ORAL | 0 refills | Status: AC | PRN

## 2020-04-14 MED ORDER — MELATONIN 5 MG PO TABS: 5.0000 mg | ORAL_TABLET | Freq: Every day | ORAL | 0 refills | Status: AC

## 2020-04-14 MED ORDER — MELATONIN 5 MG PO TABS: 5.0000 mg | ORAL_TABLET | Freq: Every day | ORAL | 0 refills | Status: DC

## 2020-04-14 MED ORDER — PREDNISONE 20 MG PO TABS: ORAL_TABLET | ORAL | 0 refills | Status: DC

## 2020-04-14 MED ORDER — FUROSEMIDE 20 MG PO TABS: 20.0000 mg | ORAL_TABLET | Freq: Every day | ORAL | 0 refills | Status: AC

## 2020-04-14 MED ORDER — FOLIC ACID 1 MG PO TABS: 1.0000 mg | ORAL_TABLET | Freq: Every day | ORAL | 0 refills | Status: DC

## 2020-04-14 MED ORDER — PANTOPRAZOLE SODIUM 40 MG PO TBEC
40.0000 mg | DELAYED_RELEASE_TABLET | Freq: Every day | ORAL | 1 refills | Status: AC
Start: 2020-04-14 — End: 2021-04-14

## 2020-04-14 MED ORDER — SPIRONOLACTONE 50 MG PO TABS: 50.0000 mg | ORAL_TABLET | Freq: Every day | ORAL | 0 refills | Status: AC

## 2020-04-14 MED ORDER — FUROSEMIDE 20 MG PO TABS: 20.0000 mg | ORAL_TABLET | Freq: Every day | ORAL | 0 refills | Status: DC

## 2020-04-14 MED ORDER — FOLIC ACID 1 MG PO TABS: 1.0000 mg | ORAL_TABLET | Freq: Every day | ORAL | 0 refills | Status: AC

## 2020-04-14 MED ORDER — MEGESTROL ACETATE 40 MG PO TABS: 40.0000 mg | ORAL_TABLET | Freq: Every day | ORAL | 0 refills | Status: AC

## 2020-04-14 NOTE — Plan of Care (Signed)
  Problem: Education: Goal: Knowledge of General Education information will improve Description Including pain rating scale, medication(s)/side effects and non-pharmacologic comfort measures Outcome: Progressing   

## 2020-04-14 NOTE — Anesthesia Postprocedure Evaluation (Signed)
Anesthesia Post Note  Patient: Jill Reyes  Procedure(s) Performed: ESOPHAGOGASTRODUODENOSCOPY (EGD) WITH PROPOFOL (N/A )  Patient location during evaluation: Endoscopy Anesthesia Type: General Level of consciousness: awake and alert Pain management: pain level controlled Vital Signs Assessment: post-procedure vital signs reviewed and stable Respiratory status: spontaneous breathing, nonlabored ventilation and respiratory function stable Cardiovascular status: blood pressure returned to baseline and stable Postop Assessment: no apparent nausea or vomiting Anesthetic complications: no   No complications documented.   Last Vitals:  Vitals:   04/14/20 0446 04/14/20 0833  BP: 106/83 107/79  Pulse: (!) 106 (!) 108  Resp:    Temp: 36.6 C 36.8 C  SpO2: 98% 98%    Last Pain:  Vitals:   04/14/20 0857  TempSrc:   PainSc: 8                  Tarick Parenteau Garry Heater

## 2020-04-14 NOTE — Progress Notes (Signed)
Pt voices concern about being d/c because she is physically weak to go home and she will not be able to get her meds since she will be getting them through med management clinic as pt has no insurance. Pt also feels like she needs a walker, SW notified however no response. Pt tearful and upset about situation . Dr. Gerri Lins notified. Per MD will cancel discharge order for today. Pt and husband aware.

## 2020-04-14 NOTE — Progress Notes (Signed)
Midge Minium, MD [facility]   [address] [place], [place] [address] Phone: [phone] Fax : [phone]   Subjective: The patient is sitting up in bed eating her lunch without any issues at this time.  She states she is feeling very well.  The patient had an EGD yesterday for possible an upper GI yeast infection but the upper endoscopy did not show any sign of infection with some mild portal gastropathy and grade 1 esophageal varices.   Objective: Vital signs in last 24 hours: Vitals:   04/13/20 1633 04/13/20 1941 04/14/20 0446 04/14/20 0833  BP:   106/83 107/79  Pulse:   (!) 106 (!) 108  Resp: 18     Temp: 98.5 F (36.9 C) 98 F (36.7 C) 97.9 F (36.6 C) 98.2 F (36.8 C)  TempSrc: Oral Oral Oral Oral  SpO2:   98% 98%  Weight:   61.1 kg   Height:       Weight change:   Intake/Output Summary (Last 24 hours) at 04/14/2020 1328 Last data filed at 04/14/2020 0700 Gross per 24 hour  Intake 560 ml  Output 350 ml  Net 210 ml     Exam: General :patient is alert and orientated x3 in no apparent distress sitting up and eating. Skin: Continues to have jaundice   Lab Results: @LABTEST2 @ Micro Results: Recent Results (from the past 240 hour(s))  Urine Culture     Status: Abnormal   Collection Time: 04/05/20  5:40 PM   Specimen: Urine, Random  Result Value Ref Range Status   Specimen Description   Final    URINE, RANDOM Performed at [facility], [address] [place], [place]    Special Requests   Final    NONE Performed at [facility], [address] [place], [place]    Culture (A)  Final    <10,000 COLONIES/mL INSIGNIFICANT GROWTH Performed at [facility], [address], [place], [place]    Report Status 04/07/2020 FINAL  Final  Body fluid culture     Status: None   Collection Time: 04/10/20  4:08 PM   Specimen: PATH Cytology Peritoneal fluid  Result Value Ref Range Status   Specimen  Description   Final    PERITONEAL Performed at [facility], [address] [place], [place]    Special Requests   Final    NONE Performed at [facility], [address] [place], [place]    Gram Stain   Final    RARE WBC PRESENT, PREDOMINANTLY MONONUCLEAR NO ORGANISMS SEEN    Culture   Final    RARE CANDIDA ALBICANS CRITICAL RESULT CALLED TO, READ BACK BY AND VERIFIED WITH: RN E.MORALIS AT 0824 ON 04/12/20 BY DV Performed at [facility], [address] [place], [place]    Report Status 04/14/2020 FINAL  Final   Studies/Results: No results found. Medications: I have reviewed the patient's current medications. Scheduled Meds:  Chlorhexidine Gluconate Cloth  6 each Topical Daily   feeding supplement  1 Container Oral TID BM   fluconazole  400 mg Oral Daily   folic acid  1 mg Oral Daily   furosemide  20 mg Oral QPC breakfast   megestrol  40 mg Oral Daily   melatonin  5 mg Oral QHS   multivitamin with minerals  1 tablet Oral Daily   nicotine  21 mg Transdermal Daily   pantoprazole  40 mg Intravenous Q12H   polyethylene glycol  17 g Oral Daily   predniSONE  40 mg Oral Q breakfast   rifaximin  550 mg Oral BID   sodium chloride flush  3 mL Intravenous Once   spironolactone  50 mg Oral QPC breakfast   thiamine  100 mg Oral Daily   Or   thiamine  100 mg Intravenous Daily   Continuous Infusions: PRN Meds:.ondansetron **OR** ondansetron (ZOFRAN) IV, sodium chloride flush, traMADol   Assessment: Principal Problem:   Acute alcoholic liver disease Active Problems:   Hypokalemia   Hyponatremia   Nicotine dependence   AKI (acute kidney injury) (HCC)   Anemia   Esophageal varices in alcoholic cirrhosis (HCC)   Portal hypertension (HCC)    Plan: This patient should continue her steroids as an outpatient with a taper over 2 weeks.  The patient is doing much better today and having no complaints.   The patient has significant alcoholic liver disease and was admitted for alcoholic hepatitis.  The patient said follow-up as an outpatient in our office.  Nothing further to add from a GI point of view.  I will sign off.  Please call if any further GI concerns or questions.  We would like to thank you for the opportunity to participate in the care of [facility].     LOS: 10 days   Sherlyn Hay 04/14/2020, 1:28 PM Pager [phone] 7am-5pm  Check AMION for 5pm -7am coverage and on weekends

## 2020-04-14 NOTE — TOC Progression Note (Signed)
Transition of Care [facility]) - Progression Note    Patient Details  Name: Ellamarie Naeve MRN: [id number] Date of Birth: 1976-01-14  Transition of Care [facility]) CM/SW Contact  Meriel Flavors, LCSW Phone Number: 04/14/2020, 10:51 AM  Clinical Narrative:    8/15:CSW met with patient and spouse. CSW was unable to verify Medicaid is pending, in addition CSW shared information concerning medicaid does not provide inpatient rehab benefits, no do they cover HH/PT. Patient and spouse made aware of the possibility patient will discharge home/self.     Expected Discharge Plan: Home w Hospice Care Barriers to Discharge: Continued Medical Work up  Expected Discharge Plan and Services Expected Discharge Plan: [place] In-house Referral: Hospice / [place], PCP / [facility] Discharge Planning Services: Other - See comment (Patient received referrals for medication assistance and pcp) Post Acute Care Choice: NA Living arrangements for the past 2 months: Single Family Home                           HH Arranged: NA HH Agency: NA         Social Determinants of Health (SDOH) Interventions    Readmission Risk Interventions Readmission Risk Prevention Plan 04/05/2020  Transportation Screening Complete  PCP or Specialist Appt within 3-5 Days Complete  HRI or [place] Not Complete  HRI or Home Care Consult comments Patient has very poor prognosis, likely to become hospice patient prior to discharge  Palliative Care Screening Complete  Medication Review (RN Care Manager) Complete  Some recent data might be hidden

## 2020-04-14 NOTE — Progress Notes (Signed)
PROGRESS NOTE  Lela Murfin HWE:[id number] DOB: [birth year] DOA: 04/04/2020 PCP: Patient, No Pcp Per  Brief History   Yuri Flener is a 44 y.o. female with medical history significant foralcohol abuse who presented to the ER for evaluation of weakness which has progressively worsened over the last several weeks. Patient states that she was drinking a few bottle of vodka daily but quit about 2 weeksprior to her admission.  She also complained of nausea, vomiting, anorexia, abdominal distention and yellowish discoloration of her skin and eyes.  Work-up revealed acute alcoholic hepatitis probable underlying alcoholic liver cirrhosis.  She was confused and this was thought to be due to hepatic encephalopathy.  She was treated with prednisone, lactulose and rifaximin.  She also had acute kidney injury and hyponatremia that were treated with IV fluids.  She was treated with empiric IV Rocephin for abnormal urinalysis, suspected UTI.  Urine culture did not show any growth.  She was transfused with 1 unit of packed red blood cells for severe anemia.  She had hypokalemia that was corrected.  Hospital course was complicated by aspiration pneumonia and acute hypoxemic respiratory failure which occurred after patient choked/aspirated while taking lactulose.  She was started on IV Unasyn for aspiration pneumonia.  She required oxygen therapy briefly for acute hypoxemic respiratory failure.  Continue IV albumin  And steroids for 3 days as per GI recommendations. The patient underwent paracentesis with 1.8 L fluid removed. Ascitic fluid has grown out rare yeast. Infectious disease has been consulted. The patient has been started on diflucan 400 mg daily. I discussed the patient with gastroenterology.   The patient is medically cleared for discharge. However, she is unable to get meds from the medication management facility until tomorrow, so there is no safe discharge today. Consultants   . Gastroenterology . Interventional radiology  Procedures  . Paracentesis  Antibiotics   Anti-infectives (From admission, onward)   Start     Dose/Rate Route Frequency Ordered Stop   04/15/20 0000  fluconazole (DIFLUCAN) 200 MG tablet  Status:  Discontinued        400 mg Oral Daily 04/14/20 1419 04/14/20    04/15/20 0000  fluconazole (DIFLUCAN) 200 MG tablet     Discontinue     400 mg Oral Daily 04/14/20 1433 04/22/20 2359   04/14/20 0000  rifaximin (XIFAXAN) 550 MG TABS tablet     Discontinue     550 mg Oral 2 times daily 04/14/20 1419     04/12/20 1700  fluconazole (DIFLUCAN) tablet 400 mg     Discontinue     400 mg Oral Daily 04/12/20 1620     04/12/20 1600  fluconazole (DIFLUCAN) IVPB 400 mg  Status:  Discontinued        400 mg 100 mL/hr over 120 Minutes Intravenous Every 24 hours 04/12/20 1510 04/12/20 1620   04/09/20 1515  Ampicillin-Sulbactam (UNASYN) 3 g in sodium chloride 0.9 % 100 mL IVPB        3 g 200 mL/hr over 30 Minutes Intravenous Every 6 hours 04/09/20 1511 04/14/20 1310   04/08/20 1600  rifaximin (XIFAXAN) tablet 550 mg     Discontinue     550 mg Oral 2 times daily 04/08/20 1448     04/04/20 1600  cefTRIAXone (ROCEPHIN) 2 g in sodium chloride 0.9 % 100 mL IVPB  Status:  Discontinued        2 g 200 mL/hr over 30 Minutes Intravenous Every 24 hours 04/04/20 1558 04/07/20 1418  Subjective  The patient is lying in bed. No new complaints. She states that she is feeling much better today.  Objective   Vitals:  Vitals:   04/14/20 0833 04/14/20 1734  BP: 107/79 108/82  Pulse: (!) 108 (!) 107  Resp:  18  Temp: 98.2 F (36.8 C) 98.9 F (37.2 C)  SpO2: 98% 94%   Exam:  Constitutional:  . The patient is awake, alert, and oriented x 3. No acute distress. Respiratory:  . No increased work of breathing. . No wheezes, rales, or rhonchi . No tactile fremitus Cardiovascular:  . Regular rate and rhythm . No murmurs, ectopy, or gallups. . No lateral PMI. No  thrills. Abdomen:  . Abdomen is soft, positive for tenderness in epigastrum, slightly distended . No hernias, masses, or organomegaly . Normoactive bowel sounds.  Musculoskeletal:  . No cyanosis, clubbing, or edema Skin:  . No rashes, lesions, ulcers . palpation of skin: no induration or nodules Neurologic:  . CN 2-12 intact . Sensation all 4 extremities intact Psychiatric:  . Mental status o Mood, affect appropriate o Orientation to person, place, time  . judgment and insight appear intact  I have personally reviewed the following:   Today's Data  . Vitals, CMP, CBC . Body fluid cell count, chemistry  Micro Data  . Urine culture (04/05/2020) - no significant growth . Body fluid culture: Growth of rare yeast   Scheduled Meds: . Chlorhexidine Gluconate Cloth  6 each Topical Daily  . feeding supplement  1 Container Oral TID BM  . fluconazole  400 mg Oral Daily  . folic acid  1 mg Oral Daily  . furosemide  20 mg Oral QPC breakfast  . megestrol  40 mg Oral Daily  . melatonin  5 mg Oral QHS  . multivitamin with minerals  1 tablet Oral Daily  . nicotine  21 mg Transdermal Daily  . pantoprazole  40 mg Intravenous Q12H  . polyethylene glycol  17 g Oral Daily  . predniSONE  40 mg Oral Q breakfast  . rifaximin  550 mg Oral BID  . sodium chloride flush  3 mL Intravenous Once  . spironolactone  50 mg Oral QPC breakfast  . thiamine  100 mg Oral Daily   Or  . thiamine  100 mg Intravenous Daily   Continuous Infusions:   Principal Problem:   Acute alcoholic liver disease Active Problems:   Hypokalemia   Hyponatremia   Nicotine dependence   AKI (acute kidney injury) (HCC)   Anemia   Esophageal varices in alcoholic cirrhosis (HCC)   Portal hypertension (HCC)   LOS: 10 days   A & P  Acute alcoholic hepatitis, probable alcoholic liver cirrhosis with hepatic encephalopathy, coagulopathy, hypoalbuminemia: Mental status is slowly improving.  Bilirubin level continues to trend  down.  Ammonia level 66 on 04/08/2020.  CT head on 04/06/2020 did not show any acute abnormality..  Continue lactulose and prednisone.  Rifaximin and IV albumin have been added. Today the  Patient's abdomen was taut and tender. She was sent for paracentesis with IR. They obtained 1.8 L. Chemistry and cell count are consistent with non-infected, transudative fluid. Cultures on the fluid have had no growth. She is receiving an albumin infusion as per GI. GI recommends discharge on 40 of prednisone daily with a slow taper and cessation of alcohol.  Yeast in ascitic fluids: I have discussed the patient with infectious disease. She will be started on diflucan as recommended. Also Dr. Rivka Safer has recommended  that the patient have a work up for possible GI bleed as perturbations in the GI mucosa could be the source of the yeast in the peritoneal ascites. This was done on 04/13/2020. It demonstrated grade I esophageal varices, and mild portal gastropathy. She is receiving diflucan 400 mg daily as per infectious disease's recommendation.  Severe anemia, reported history of recent melena: Hemoglobin was 12.6 on 01/25/2020.  S/p transfusion with 1 unit of packed red blood cells on 04/05/2020.  H&H has remained stable since transfusion..  Continue IV Protonix.  Monitor H&H. Hemoglobin 7.7 today.  Aspiration pneumonia, acute hypoxemic respiratory failure: Start IV Unasyn.  Oxygenation has improved and she's tolerating room air.  Hyponatremia: Resolving. Monitor sodium level.  Hypokalemia: Resolved. Monitor.   Hypotension: BP's remain low normal, but improved over previous. Monitor.  Acute kidney injury: Creatinine has improved.  Discontinue IV fluids.  Abnormal urinalysis, questionable acute UTI, leukocytosis: Insignificant growth on urine culture.  She has received 3 days worth of IV Rocephin which was completed on 04/07/2020.  Thrombocytopenia: This is likely from liver disease.Monitor and avoid  anticoagulation.  Alcohol use disorder: Continue thiamine, folic acid and Ativan as needed.  Debility: Patient is extremely weak.  Prognosis is poor.  I have seen and examined this patient myself. I have spent 30 minutes in her evaluation and care.  DVT prophylaxis: SCD's CODE STATUS: Full code Family Communication: Family at bedside. Disposition: Status is: Inpatient  Remains inpatient appropriate because:IV treatments appropriate due to intensity of illness or inability to take PO   Dispo: The patient is from: Home              Anticipated d/c is to: SNF              Anticipated d/c date is: 1 days              Patient currently is medically stable for discharge, but there is no safe discharge as the patient will not be able to get her medications until tomorrow.  Krislynn Gronau, DO [facility] Direct contact: see [email or web address]  7PM-7AM contact night coverage as above 04/14/2020, 6:05 PM  LOS: 6 days

## 2020-04-14 NOTE — Progress Notes (Signed)
Physical Therapy Treatment Patient Details Name: Jill Reyes MRN: [id number] DOB: [birth year] Today's Date: 04/14/2020    History of Present Illness Per MD Notes: Jill Reyes is a 44 y.o. female with medical history significant for alcohol abuse who presented to the ER for evaluation of weakness which has progressively worsened over the last several weeks. Patient states that she was drinking a few bottle of vodka daily but quit about 2 weeks prior to her admission.  She also complained of nausea, vomiting, anorexia, abdominal distention and yellowish discoloration of her skin and eyes.Work-up revealed acute alcoholic hepatitis probable underlying alcoholic liver cirrhosis.  She was confused and this was thought to be due to hepatic encephalopathy.  She was treated with prednisone, lactulose and rifaximin.  She also had acute kidney injury and hyponatremia that were treated with IV fluids.  She was treated with empiric IV Rocephin for abnormal urinalysis, suspected UTI.  Urine culture did not show any growth.  She was transfused with 1 unit of packed red blood cells for severe anemia.  She had hypokalemia that was corrected.Hospital course was complicated by aspiration pneumonia and acute hypoxemic respiratory failure which occurred after patient choked/aspirated while taking lactulose.  She was started on IV Unasyn for aspiration pneumonia.  She required oxygen therapy briefly for acute hypoxemic respiratory failure.    PT Comments    Patient requires substantial time and coaxing to perform transfers and there-ex this date. She reports intermittent abdominal, cervical, and L knee pain though these did not appear to limit her mobility substantially this date. She is still quite weak and has notable "shakiness"/dyskinesia of the UEs in tasks like brushing her teeth. She has had notable improvement in her mobility from evaluation, however appears much more appropriate for RW at this time.   Follow Up  Recommendations  Home health PT     Equipment Recommendations  Rolling walker with 5" wheels;3in1 (PT)    Recommendations for Other Services       Precautions / Restrictions Precautions Precautions: Fall Precaution Comments: High Fall Restrictions Weight Bearing Restrictions: No    Mobility  Bed Mobility Overal bed mobility: Modified Independent             General bed mobility comments: Patient is able to complete without assist but takes substantial time, cuing to perform due to pain and weakness.  Transfers Overall transfer level: Needs assistance Equipment used: Rolling walker (2 wheeled) Transfers: Sit to/from Stand Sit to Stand: Min assist;From elevated surface         General transfer comment: Patient unable to transfer without assist even with elevated surface, requires min A to complete.  Ambulation/Gait Ambulation/Gait assistance: Min guard Gait Distance (Feet): 30 Feet Assistive device: Rolling walker (2 wheeled) Gait Pattern/deviations: WFL(Within Functional Limits);Decreased step length - right;Decreased step length - left   Gait velocity interpretation: <1.31 ft/sec, indicative of household ambulator General Gait Details: Patient has very minimal stride length bil, use of RW appropriate but fatigues quickly.   Stairs             Wheelchair Mobility    Modified Rankin (Stroke Patients Only)       Balance Overall balance assessment: Needs assistance Sitting-balance support: Bilateral upper extremity supported;Feet supported Sitting balance-Leahy Scale: Normal Sitting balance - Comments: Patient demonstrates good trunk control once in sitting.     Standing balance-Leahy Scale: Fair Standing balance comment: Requires RW, but able to stand at sink to brush teeth independently.  Cognition Arousal/Alertness: Awake/alert Behavior During Therapy: WFL for tasks assessed/performed Overall Cognitive  Status: Impaired/Different from baseline Area of Impairment: Safety/judgement;Memory                     Memory: Decreased short-term memory   Safety/Judgement: Decreased awareness of deficits     General Comments: Patient A&O x 3, requires constant feedback and cuing to initiate ambulation and transfers.      Exercises Other Exercises Other Exercises: SLRs x 15 bil, Heel slides x 15 bil Other Exercises: Brushing teeth independently at sink, min guard A to transfer to [facility].    General Comments        Pertinent Vitals/Pain Pain Assessment: Faces Faces Pain Scale: Hurts even more Pain Location: bilateral legs; stomach pain Pain Descriptors / Indicators: Aching Pain Intervention(s): Limited activity within patient's tolerance;Monitored during session    Home Living                      Prior Function            PT Goals (current goals can now be found in the care plan section) Acute Rehab PT Goals Patient Stated Goal: To have less pain PT Goal Formulation: With patient Time For Goal Achievement: 04/25/20 Potential to Achieve Goals: Good Additional Goals Additional Goal #1: Pt will perform bed mobility and sit <> stand transfers from bed/chair level with no more than mod I and LRAD. Progress towards PT goals: Progressing toward goals    Frequency    Min 2X/week      PT Plan Current plan remains appropriate    Co-evaluation              AM-PAC PT "6 Clicks" Mobility   Outcome Measure  Help needed turning from your back to your side while in a flat bed without using bedrails?: None Help needed moving from lying on your back to sitting on the side of a flat bed without using bedrails?: None Help needed moving to and from a bed to a chair (including a wheelchair)?: A Little Help needed standing up from a chair using your arms (e.g., wheelchair or bedside chair)?: A Little Help needed to walk in hospital room?: A Little Help needed climbing  3-5 steps with a railing? : A Lot 6 Click Score: 19    End of Session Equipment Utilized During Treatment: Gait belt Activity Tolerance: Patient limited by fatigue Patient left: Other (comment) (On BSC with call bell in reach, RN staff aware.) Nurse Communication: Mobility status PT Visit Diagnosis: Unsteadiness on feet (R26.81);Muscle weakness (generalized) (M62.81);History of falling (Z91.81);Pain Pain - part of body: Leg (Stomach and L knee pain)     Time: 8338-2505 PT Time Calculation (min) (ACUTE ONLY): 29 min  Charges:  $Gait Training: 8-22 mins $Therapeutic Exercise: 8-22 mins            Alva Garnet PT, DPT, CSCS             04/14/2020, 12:45 PM

## 2020-04-15 ENCOUNTER — Encounter: Payer: Self-pay | Admitting: Gastroenterology

## 2020-04-15 LAB — CBC WITH DIFFERENTIAL/PLATELET
Abs Immature Granulocytes: 0.21 10*3/uL — ABNORMAL HIGH (ref 0.00–0.07)
Basophils Absolute: 0 10*3/uL (ref 0.0–0.1)
Basophils Relative: 0 %
Eosinophils Absolute: 0 10*3/uL (ref 0.0–0.5)
Eosinophils Relative: 0 %
HCT: 25.8 % — ABNORMAL LOW (ref 36.0–46.0)
Hemoglobin: 8.4 g/dL — ABNORMAL LOW (ref 12.0–15.0)
Immature Granulocytes: 1 %
Lymphocytes Relative: 4 %
Lymphs Abs: 1 10*3/uL (ref 0.7–4.0)
MCH: 34.9 pg — ABNORMAL HIGH (ref 26.0–34.0)
MCHC: 32.6 g/dL (ref 30.0–36.0)
MCV: 107.1 fL — ABNORMAL HIGH (ref 80.0–100.0)
Monocytes Absolute: 1 10*3/uL (ref 0.1–1.0)
Monocytes Relative: 4 %
Neutro Abs: 21.5 10*3/uL — ABNORMAL HIGH (ref 1.7–7.7)
Neutrophils Relative %: 91 %
Platelets: 181 10*3/uL (ref 150–400)
RBC: 2.41 MIL/uL — ABNORMAL LOW (ref 3.87–5.11)
RDW: 19.1 % — ABNORMAL HIGH (ref 11.5–15.5)
WBC: 23.7 10*3/uL — ABNORMAL HIGH (ref 4.0–10.5)
nRBC: 0 % (ref 0.0–0.2)

## 2020-04-15 LAB — COMPREHENSIVE METABOLIC PANEL
ALT: 68 U/L — ABNORMAL HIGH (ref 0–44)
AST: 126 U/L — ABNORMAL HIGH (ref 15–41)
Albumin: 2.9 g/dL — ABNORMAL LOW (ref 3.5–5.0)
Alkaline Phosphatase: 192 U/L — ABNORMAL HIGH (ref 38–126)
Anion gap: 12 (ref 5–15)
BUN: 31 mg/dL — ABNORMAL HIGH (ref 6–20)
CO2: 22 mmol/L (ref 22–32)
Calcium: 8.6 mg/dL — ABNORMAL LOW (ref 8.9–10.3)
Chloride: 101 mmol/L (ref 98–111)
Creatinine, Ser: 0.93 mg/dL (ref 0.44–1.00)
GFR calc Af Amer: >60 mL/min (ref >60)
GFR calc non Af Amer: >60 mL/min (ref >60)
Glucose, Bld: 125 mg/dL — ABNORMAL HIGH (ref 70–99)
Potassium: 4.3 mmol/L (ref 3.5–5.1)
Sodium: 135 mmol/L (ref 135–145)
Total Bilirubin: 9.8 mg/dL — ABNORMAL HIGH (ref 0.3–1.2)
Total Protein: 5.7 g/dL — ABNORMAL LOW (ref 6.5–8.1)

## 2020-04-15 NOTE — Discharge Instructions (Signed)
Alcoholic Hepatitis  Alcoholic hepatitis is liver inflammation that is caused by drinking a lot of alcohol over a long period of time. This inflammation decreases the liver's ability to function normally. This condition requires you to stop drinking alcohol permanently to prevent further damage. What are the causes? Alcoholic hepatitis is caused by long-term (chronic) heavy alcohol use. The liver filters alcohol out of the bloodstream. When alcohol gets divided into small particles (broken down) in the liver, substances are produced that can damage liver cells. This causes destruction of liver cells and inflammation. What increases the risk? The following factors may make you more likely to develop this condition:  Regularly drinking large amounts of alcohol, especially in a short amount of time (binge drinking).  Drinking heavily for years.  Being female.  Being obese.  Having had a hepatitis infection in the past.  Having a liver problem that you were born with (genetic liver disease).  Having a lack (deficiency) of certain nutrients, such as folate or thiamine.  Having a parent or sibling who has alcoholic hepatitis. What are the signs or symptoms? Symptoms of this condition include:  Pain and swelling in the abdomen.  Loss of appetite.  Losing weight without trying.  Nausea and vomiting.  Diarrhea.  Fever.  Fatigue.  Yellowing of the skin and the whites of the eyes (jaundice).  Veins that you can see ("spider veins"), especially in the abdomen.  Bleeding easily, such as excessive bleeding from a minor cut.  Itching.  Trouble thinking clearly.  Memory problems.  Mood changes.  Confusion. How is this diagnosed? This condition may be diagnosed with:  A physical exam and a review of medical history.  Blood tests to check liver function.  Tests that create detailed images of the body. These may include: ? A liver ultrasound. ? CT scan. ? MRI.  A  liver biopsy. For this test, a small sample of liver tissue is removed and checked for signs of liver damage. How is this treated? The most important part of treatment is to stop drinking alcohol. If you are addicted to alcohol, your health care provider will help you make a plan to quit. This plan may involve:  Taking medicine to decrease unpleasant symptoms that are caused by stopping or decreasing alcohol use (withdrawal symptoms).  Entering a treatment program to help you stop drinking.  Joining a support group. Treatment for alcoholic hepatitis may also include:  Steroid medicines to reduce inflammation.  Nutritional therapy. Your health care provider or a diet and nutrition specialist (dietitian) may recommend: ? Eating a healthy diet. ? Eating specific foods that contain vitamins and minerals to help you maintain nutrient levels in your body. ? Taking vitamins and dietary supplements to make sure you maintain nutrient levels in your body.  Receiving a donated liver (liver transplant). This is only done in very severe cases, and only for people who have completely stopped drinking and can commit to never drinking alcohol again. Follow these instructions at home:   Do not drink alcohol. Follow your treatment plan, and work with your health care provider as needed.  Consider joining an alcohol support group. These groups can provide emotional support and guidance.  Take over-the-counter and prescription medicines only as told by your health care provider. These include vitamins and supplements.  Do not use medicines or eat foods that contain alcohol unless told by your health care provider.  Follow instructions from your health care provider or dietitian about nutritional therapy.    Keep all follow-up visits as told by your health care provider. This is important. Contact a health care provider if:  You have a fever.  You have a decreased appetite.  You have flu-like  symptoms such as fatigue, weakness, or muscle aches.  You have nausea or vomiting.  You bruise easily.  Your urine is very dark.  You develop new pain in your abdomen. Get help right away if:  You vomit blood.  You develop jaundice.  You have severely itchy skin.  Your legs swell.  Your abdomen suddenly swells.  You have stools that are black, tar-like, or bloody.  You bleed easily, such as excessive bleeding from a minor cut.  You are confused or not thinking clearly.  You have a seizure. Summary  Alcoholic hepatitis is liver inflammation that is caused by drinking a lot of alcohol over a long period of time.  Alcoholic hepatitis is diagnosed with blood tests that check liver function.  The most important part of treatment is to stop drinking alcohol. Follow your treatment plan, and work with your health care provider as needed. This information is not intended to replace advice given to you by your health care provider. Make sure you discuss any questions you have with your health care provider. Document Revised: 12/06/2018 Document Reviewed: 04/29/2017 Elsevier Patient Education  2020 [employer]   Ascites  Ascites is a collection of too much fluid in the abdomen. Ascites can range from mild to severe. If ascites is not treated, it can get worse. What are the causes? This condition may be caused by:  A liver condition called cirrhosis. This is the most common cause of ascites.  Long-term (chronic) or alcoholic hepatitis.  Infection or inflammation in the abdomen.  Cancer in the abdomen.  Heart failure.  Kidney disease.  Inflammation of the pancreas.  Clots in the veins of the liver. What are the signs or symptoms? Symptoms of this condition include:  A feeling of fullness in the abdomen. This is common.  An increase in the size of the abdomen or waist.  Swelling in the legs.  Swelling of the scrotum (in men).  Difficulty breathing.  Pain  in the abdomen.  Sudden weight gain. If the condition is mild, you may not have symptoms. How is this diagnosed? This condition is diagnosed based on your medical history and a physical exam. Your health care provider may order imaging tests, such as an ultrasound or CT scan of your abdomen. How is this treated? Treatment for this condition depends on the cause of the ascites. It may include:  Taking a pill to make you urinate. This is called a water pill (diuretic pill).  Strictly reducing your salt (sodium) intake. Salt can cause extra fluid to be kept (retained) in the body, and this makes ascites worse.  Having a procedure to remove fluid from your abdomen (paracentesis).  Having a procedure that connects two of the major veins within your liver and relieves pressure on your liver. This is called a TIPS procedure (transjugular intrahepatic portosystemic shunt procedure).  Placement of a drainage catheter (peritoneovenous shunt) to manage the extra fluid in the abdomen. Ascites may go away or improve when the condition that caused it is treated. Follow these instructions at home:  Keep track of your weight. To do this, weigh yourself at the same time every day and write down your weight.  Keep track of how much you drink and any changes in how much or how  often you urinate.  Follow any instructions that your health care provider gives you about how much to drink.  Try not to eat salty (high-sodium) foods.  Take over-the-counter and prescription medicines only as told by your health care provider.  Keep all follow-up visits as told by your health care provider. This is important.  Report any changes in your health to your health care provider, especially if you develop new symptoms or your symptoms get worse. Contact a health care provider if:  You gain more than 3 lb (1.36 kg) in 3 days.  Your waist size increases.  You have new swelling in your legs.  The swelling in  your legs gets worse. Get help right away if:  You have a fever.  You are confused.  You have new or worsening breathing trouble.  You have new or worsening pain in your abdomen.  You have new or worsening swelling in the scrotum (in men). Summary  Ascites is a collection of too much fluid in the abdomen.  Ascites may be caused by various conditions, such as cirrhosis, hepatitis, cancer, or congestive heart failure.  Symptoms may include swelling of the abdomen and other areas due to extra fluid in the body.  Treatments may involve dietary changes, medicines, or procedures. This information is not intended to replace advice given to you by your health care provider. Make sure you discuss any questions you have with your health care provider. Document Revised: 07/19/2018 Document Reviewed: 04/29/2017 Elsevier Patient Education  2020 [employer]   Alcoholic Liver Disease  Alcoholic liver disease is liver damage that is caused by drinking a lot of alcohol for a long time. If you have this disease, you must stop drinking alcohol. Follow these instructions at home:   Do not drink alcohol. Follow your treatment plan. Work with your doctor if you need help.  Think about joining an alcohol support group.  Take over-the-counter and prescription medicines only as told by your doctor. These include vitamins.  Do not use medicines or eat foods that have alcohol in them, unless your doctor says that it is safe.  Follow instructions from your doctor about eating a healthy diet.  Keep all follow-up visits as told by your doctor. This is important. Contact a doctor if:  You get a fever.  Your skin starts to look more yellow, pale, or dark.  You get headaches. Get help right away if:  You throw up (vomit) blood.  You have bright red blood in your poop (stool).  Your poop looks black or looks like tar.  You have  trouble: ? Thinking. ? Walking. ? Balancing. ? Breathing. Summary  Alcoholic liver disease is liver damage that is caused by drinking a lot of alcohol for a long time.  If you have this disease, you must stop drinking alcohol. Follow your treatment plan, and work with your doctor as needed.  Think about joining an alcohol support group. This information is not intended to replace advice given to you by your health care provider. Make sure you discuss any questions you have with your health care provider. Document Revised: 12/06/2018 Document Reviewed: 05/07/2017 Elsevier Patient Education  2020 [employer].

## 2020-04-15 NOTE — Discharge Summary (Signed)
Physician Discharge Summary  Jill Reyes HWE:[id number] DOB: 07-11-1976 DOA: 04/04/2020  PCP: Patient, No Pcp Per  Admit date: 04/04/2020 Discharge date: 04/15/2020  Recommendations for Outpatient Follow-up:  1. Discharge to home with home health PT and a 5" wheeled walker. 2. Follow up with PCP in 7-10 days. 3. Follow up with gastroenterology 4. No alcohol. 5. The patient should have CBC, chemistry, and thyroid panel drawn by PCP at follow up visit.  Discharge Diagnoses: Principal diagnosis is #1 1. Hepatic encephalopathy 2. Ascites with yeast infection 3. AKI 4. ETOH Cirrhosis 5. Anemia 6. Exopthalmos 7. Moderate protein calorie malnutrition  Discharge Condition: Fair  Disposition: Home  Diet recommendation: Regular  Filed Weights   04/11/20 0503 04/13/20 1453 04/14/20 0446  Weight: 52.2 kg 52.2 kg 61.1 kg    History of present illness:  Jill Reyes is a 44 y.o. female with medical history significant for alcohol abuse who presents to the ER for evaluation of weakness which has progressively worsened over the last several weeks. Patient states that she was drinking a few bottle of vodka daily but quit about 2 weeks prior to her admission. Patient states that she has not felt well for weeks.  She complains of nausea, vomiting and anorexia.  Over the last 2 days she noted abdominal distention with yellow discoloration of her skin and eyes which prompted her visit to the ER.   Labs reveal sodium of 119, potassium of 2.6, BUN of 50, creatinine of 1.5 from a baseline of 0.3, total bilirubin of 20.1.  Hemoglobin is 7.9 down from 12.6 and platelet count is 124, white count 13.7. Gallbladder ultrasound shows mild ascites. Fatty liver. Gallbladder sludge. Chest x-ray reviewed by me shows atelectasis right lung base. Twelve-lead EKG reviewed by me shows sinus tachycardia   ED Course: Patient is a 44 year old female with a history of alcohol abuse who presents to the emergency room  for evaluation of yellow discoloration of her skin and sclera.  Patient has multiple electrolyte abnormalities, hyperbilirubinemia, acute kidney injury and anemia.  She will be admitted to the hospital for further evaluation.  Hospital Course:  Jill Reyes a 44 y.o.femalewith medical history significant foralcohol abuse who presentedto the ER for evaluation of weakness which has progressively worsened over the last several weeks. Patient states that she was drinking a few bottle of vodka daily but quit about 2 weeksprior to her admission.She also complained of nausea, vomiting,anorexia, abdominal distention and yellowish discoloration of her skin and eyes.  Work-up revealed acute alcoholic hepatitis probable underlying alcoholic liver cirrhosis. She was confused and this was thought to be due to hepatic encephalopathy. She was treated with prednisone, lactulose and rifaximin. She also had acute kidney injury and hyponatremia that weretreated with IV fluids. She was treated with empiric IV Rocephin for abnormal urinalysis, suspected UTI. Urine culture did not show any growth. She was transfused with 1 unit of packed red blood cells for severe anemia. She had hypokalemia that was corrected.  Hospital course was complicated by aspiration pneumonia and acute hypoxemic respiratory failure which occurred after patient choked/aspirated while taking lactulose. She was started on IV Unasyn for aspiration pneumonia. She required oxygen therapy briefly for acute hypoxemic respiratory failure.  Continue IV albumin  And steroids for 3 days as per GI recommendations. The patient underwent paracentesis with 1.8 L fluid removed. Ascitic fluid has grown out rare yeast. Infectious disease has been consulted. The patient has been started on diflucan 400 mg daily. I discussed the patient  with gastroenterology. ID also has noted that the patient's sclera appear more prominent than would be normal. Dr.  Rivka Safer has recommended that a thyroid panel be ordered. As the patient is being discharged today, I have recommended that this be drawn on her labs at her first visit with PCP in 7-10 days.  The patient is medically cleared for discharge to home with home health.   Today's assessment: S: The patient is resting comfortably. No new complaints. O: Vitals:  Vitals:   04/15/20 0827 04/15/20 1207  BP: 112/79 109/73  Pulse: (!) 112 (!) 125  Resp: 17 19  Temp: 98 F (36.7 C) 98 F (36.7 C)  SpO2: 94% 94%   Eyes:  . pupils and irises appear normal . Sclera are a little prominent. Exam:  Constitutional:   The patient is awake, alert, and oriented x 3. No acute distress. Eyes:  . pupils and irises appear normal . Sclera are a little prominent. Respiratory:   No increased work of breathing.  No wheezes, rales, or rhonchi  No tactile fremitus Cardiovascular:   Regular rate and rhythm  No murmurs, ectopy, or gallups.  No lateral PMI. No thrills. Abdomen:   Abdomen is soft, positive for tenderness in epigastrum, slightly distended  No hernias, masses, or organomegaly  Normoactive bowel sounds.  Musculoskeletal:   No cyanosis, clubbing, or edema Skin:   No rashes, lesions, ulcers  palpation of skin: no induration or nodules Neurologic:   CN 2-12 intact  Sensation all 4 extremities intact Psychiatric:   Mental status ? Mood, affect appropriate ? Orientation to person, place, time   judgment and insight appear intact  Discharge Instructions  Discharge Instructions    Activity as tolerated - No restrictions   Complete by: As directed    Call MD for:  persistant nausea and vomiting   Complete by: As directed    Call MD for:  severe uncontrolled pain   Complete by: As directed    Call MD for:  temperature >100.4   Complete by: As directed    Diet - low sodium heart healthy   Complete by: As directed    Discharge instructions   Complete by: As  directed    Discharge to home with home health PT and a 5" wheeled walker. Follow up with PCP in 7-10 days. Follow up with gastroenterology No alcohol. The patient should have CBC, chemistry, and thyroid panel drawn by PCP at follow up visit.   Increase activity slowly   Complete by: As directed      Allergies as of 04/15/2020      Reactions   Acetaminophen Shortness Of Breath      Medication List    TAKE these medications   fluconazole 200 MG tablet Commonly known as: DIFLUCAN Take 2 tablets (400 mg total) by mouth daily for 7 days.   folic acid 1 MG tablet Commonly known as: FOLVITE Take 1 tablet (1 mg total) by mouth daily.   furosemide 20 MG tablet Commonly known as: LASIX Take 1 tablet (20 mg total) by mouth daily after breakfast.   loratadine 10 MG tablet Commonly known as: CLARITIN Take 1 tablet (10 mg total) by mouth daily.   megestrol 40 MG tablet Commonly known as: MEGACE Take 1 tablet (40 mg total) by mouth daily.   melatonin 5 MG Tabs Take 1 tablet (5 mg total) by mouth at bedtime.   multivitamin with minerals Tabs tablet Take 1 tablet by mouth daily.  nicotine 21 mg/24hr patch Commonly known as: NICODERM CQ - dosed in mg/24 hours Place 1 patch (21 mg total) onto the skin daily.   pantoprazole 40 MG tablet Commonly known as: Protonix Take 1 tablet (40 mg total) by mouth daily.   polyethylene glycol 17 g packet Commonly known as: MIRALAX / GLYCOLAX Take 17 g by mouth daily.   predniSONE 20 MG tablet Commonly known as: DELTASONE Take 2 tablets (40 mg total) by mouth daily with breakfast for 3 days, THEN 1.5 tablets (30 mg total) daily with breakfast for 4 days, THEN 1 tablet (20 mg total) daily with breakfast for 4 days, THEN 0.5 tablets (10 mg total) daily with breakfast for 4 days. Start taking on: April 15, 2020   rifaximin 550 MG Tabs tablet Commonly known as: XIFAXAN Take 1 tablet (550 mg total) by mouth 2 (two) times daily.     spironolactone 50 MG tablet Commonly known as: ALDACTONE Take 1 tablet (50 mg total) by mouth daily after breakfast.   thiamine 100 MG tablet Take 1 tablet (100 mg total) by mouth daily.   traMADol 50 MG tablet Commonly known as: ULTRAM Take 1 tablet (50 mg total) by mouth 3 (three) times daily as needed for moderate pain.            Durable Medical Equipment  (From admission, onward)         Start     Ordered   04/14/20 1646  For home use only DME Walker  Once       Question:  Patient needs a walker to treat with the following condition  Answer:  Debility   04/14/20 1645   04/14/20 1415  DME Walker  Once       Question Answer Comment  Walker: With 5 Inch Wheels   Patient needs a walker to treat with the following condition Debility      04/14/20 1419         Allergies  Allergen Reactions  . Acetaminophen Shortness Of Breath    The results of significant diagnostics from this hospitalization (including imaging, microbiology, ancillary and laboratory) are listed below for reference.    Significant Diagnostic Studies: CT HEAD WO CONTRAST  Result Date: 04/06/2020 CLINICAL DATA:  Mental status change, unknown cause. EXAM: CT HEAD WITHOUT CONTRAST TECHNIQUE: Contiguous axial images were obtained from the base of the skull through the vertex without intravenous contrast. COMPARISON:  No pertinent prior exams are available for comparison. FINDINGS: Brain: Mild generalized parenchymal atrophy. There is no acute intracranial hemorrhage. No demarcated cortical infarct is identified. No extra-axial fluid collection. No evidence of intracranial mass. No midline shift. Vascular: No hyperdense vessel. Skull: Normal. Negative for fracture or focal lesion. Sinuses/Orbits: Visualized orbits show no acute finding. No significant paranasal sinus disease or mastoid effusion at the imaged levels. IMPRESSION: No CT evidence of acute intracranial abnormality. Mild generalized parenchymal  atrophy. Electronically Signed   By: Jackey Loge DO   On: 04/06/2020 09:55   US Abdomen Limited  Result Date: 04/04/2020 CLINICAL DATA:  History of alcoholic liver disease and ascites. Abdominal distension and jaundice. EXAM: LIMITED ABDOMEN ULTRASOUND FOR ASCITES TECHNIQUE: Limited ultrasound survey for ascites was performed in all four abdominal quadrants. COMPARISON:  Right upper quadrant abdominal ultrasound earlier today. FINDINGS: Survey of the peritoneal cavity demonstrates only a minimal amount of scattered ascites. There is not enough fluid to allow for paracentesis. IMPRESSION: Minimal ascites in the peritoneal cavity. Electronically Signed   By:  Irish LackGlenn  Yamagata M.D.   On: 04/04/2020 15:28   US Paracentesis  Result Date: 04/10/2020 INDICATION: Patient with history of alcohol abuse, alcoholic hepatitis with new onset abdominal distension. Request to IR for diagnostic and therapeutic paracentesis. EXAM: ULTRASOUND GUIDED DIAGNOSTIC AND THERAPEUTIC PARACENTESIS MEDICATIONS: 10 mL lidocaine 1% COMPLICATIONS: None immediate. PROCEDURE: Informed written consent was obtained from the patient after a discussion of the risks, benefits and alternatives to treatment. A timeout was performed prior to the initiation of the procedure. Initial ultrasound scanning demonstrates a small amount of ascites within the right lower abdominal quadrant. The right lower abdomen was prepped and draped in the usual sterile fashion. 1% lidocaine was used for local anesthesia. Following this, a 6 Fr Safe-T-Centesis catheter was introduced. An ultrasound image was saved for documentation purposes. The paracentesis was performed. The catheter was removed and a dressing was applied. The patient tolerated the procedure well without immediate post procedural complication. FINDINGS: A total of approximately 1.35 L of clear, bright yellow fluid was removed. Samples were sent to the laboratory as requested by the clinical team.  IMPRESSION: Successful ultrasound-guided paracentesis yielding 1.35 liters of peritoneal fluid. Read by Lynnette CaffeyShannon Watterson, PA-C Electronically Signed   By: Alcide CleverMark  Lukens M.D.   On: 04/10/2020 16:39   DG Chest Port 1 View  Result Date: 04/09/2020 CLINICAL DATA:  Aspiration EXAM: PORTABLE CHEST 1 VIEW COMPARISON:  April 04, 2020 FINDINGS: There are areas of airspace opacity in the lung bases with associated atelectasis in the right base. Heart size and pulmonary vascularity are normal. No adenopathy. No bone lesions. IMPRESSION: Bibasilar airspace opacity consistent with either pneumonia or aspiration. Both entities may be present concurrently. There is stable right base atelectasis as well. Heart size normal. Electronically Signed   By: Bretta BangWilliam  Woodruff III M.D.   On: 04/09/2020 11:20   DG Chest Portable 1 View  Result Date: 04/04/2020 CLINICAL DATA:  Weakness EXAM: PORTABLE CHEST 1 VIEW COMPARISON:  01/24/2020 FINDINGS: Cardiac shadow is within normal limits. The lungs are well aerated bilaterally. Minimal platelike atelectasis in the right base is noted. No focal infiltrate or effusion is seen. IMPRESSION: Mild right basilar atelectasis. Electronically Signed   By: Alcide CleverMark  Lukens M.D.   On: 04/04/2020 11:33   US ABDOMEN LIMITED RUQ  Result Date: 04/04/2020 CLINICAL DATA:  Jaundice for 2 days EXAM: ULTRASOUND ABDOMEN LIMITED RIGHT UPPER QUADRANT COMPARISON:  None. FINDINGS: Gallbladder: Well distended with gallbladder sludge. No wall thickening or pericholecystic fluid is noted. Common bile duct: Diameter: 3.6 mm. Liver: Increase in echogenicity consistent with fatty infiltration stable from the prior CT examination. Portal vein is patent on color Doppler imaging with normal direction of blood flow towards the liver. Other: None. IMPRESSION: Mild ascites. Fatty liver. Gallbladder sludge. Electronically Signed   By: Alcide CleverMark  Lukens M.D.   On: 04/04/2020 12:00    Microbiology: Recent Results (from the past  240 hour(s))  Urine Culture     Status: Abnormal   Collection Time: 04/05/20  5:40 PM   Specimen: Urine, Random  Result Value Ref Range Status   Specimen Description   Final    URINE, RANDOM Performed at [facility], [address] [place], [place] [address]    Special Requests   Final    NONE Performed at [facility], [address] [place], [place] [address]    Culture (A)  Final    <10,000 COLONIES/mL INSIGNIFICANT GROWTH Performed at [facility], [address] [place],  [place] [address]    Report Status 04/07/2020 FINAL  Final  Body fluid culture     Status: None   Collection Time: 04/10/20  4:08 PM   Specimen: PATH Cytology Peritoneal fluid  Result Value Ref Range Status   Specimen Description   Final    PERITONEAL Performed at [facility], [address], [place], [place] [address]    Special Requests   Final    NONE Performed at [facility], [address] [place], [place] [address]    Gram Stain   Final    RARE WBC PRESENT, PREDOMINANTLY MONONUCLEAR NO ORGANISMS SEEN    Culture   Final    RARE CANDIDA ALBICANS CRITICAL RESULT CALLED TO, READ BACK BY AND VERIFIED WITH: RN E.MORALIS AT 0824 ON 04/12/20 BY DV Performed at [facility], [address] [place], [place] [address]    Report Status 04/14/2020 FINAL  Final     Labs: Basic Metabolic Panel: Recent Labs  Lab 04/09/20 0511 04/10/20 0532 04/11/20 0705 04/12/20 0650 04/15/20 1143  NA 133* 134* 134* 136 135  K 3.2* 4.2 3.5 4.0 4.3  CL 98 100 99 100 101  CO2 GLUCOSE 119* 99 101* 101* 125*  BUN 33* 30* 27* 30* 31*  CREATININE 0.87 0.82 0.86 0.99 0.93  CALCIUM 8.6* 8.4* 8.6* 8.8* 8.6*  MG 3.0* 3.1*  --   --   --   PHOS UNABLE TO REPORT DUE TO ICTERUS UNABLE TO REPORT DUE TO ICTERUS  --   --   --    Liver Function Tests: Recent Labs  Lab 04/09/20 0511 04/10/20 0532 04/11/20 0705 04/15/20 1143  AST 199* 163* 117*  126*  ALT 80* 70* 60* 68*  ALKPHOS 159* 139* 124 192*  BILITOT 12.9* 12.9* 12.6* 9.8*  PROT 5.7* 5.6* 5.3* 5.7*  ALBUMIN 2.9* 3.1* 2.9* 2.9*   No results for input(s): LIPASE, AMYLASE in the last 168 hours. Recent Labs  Lab 04/09/20 0511 04/10/20 0532  AMMONIA 67* 51*   CBC: Recent Labs  Lab 04/09/20 0511 04/10/20 0532 04/11/20 0705 04/15/20 1143  WBC 15.5* 13.9* 12.5* 23.7*  NEUTROABS 13.0* 11.7* 10.2* 21.5*  HGB 8.5* 7.8* 7.7* 8.4*  HCT 25.4* 23.7* 22.8* 25.8*  MCV 104.1* 106.3* 103.6* 107.1*  PLT 122* 122* 121* 181   Cardiac Enzymes: No results for input(s): CKTOTAL, CKMB, CKMBINDEX, TROPONINI in the last 168 hours. BNP: BNP (last 3 results) No results for input(s): BNP in the last 8760 hours.  ProBNP (last 3 results) No results for input(s): PROBNP in the last 8760 hours.  CBG: No results for input(s): GLUCAP in the last 168 hours.  Principal Problem:   Acute alcoholic liver disease Active Problems:   Hypokalemia   Hyponatremia   Nicotine dependence   AKI (acute kidney injury) (HCC)   Anemia   Esophageal varices in alcoholic cirrhosis (HCC)   Portal hypertension (HCC)   Time coordinating discharge: 38 minutes  Signed:        Tiffany Calmes, DO [facility]  04/15/2020, 1:35 PM

## 2020-04-15 NOTE — Progress Notes (Signed)
ID Doing okay No pain abdomen  Patient Vitals for the past 24 hrs:  BP Temp Temp src Pulse Resp SpO2  04/15/20 0827 112/79 98 F (36.7 C) Oral (!) 112 17 94 %  04/15/20 0603 121/84 98.5 F (36.9 C) Oral (!) 102 -- 95 %  04/14/20 1931 101/73 98.4 F (36.9 C) Oral 95 -- 96 %  04/14/20 1734 108/82 98.9 F (37.2 C) Oral (!) 107 18 94 %   O/e awake and alert abd distended , not tender Less icteric  CBC Latest Ref Rng & Units 04/11/2020 04/10/2020 04/09/2020  WBC 4.0 - 10.5 K/uL 12.5(H) 13.9(H) 15.5(H)  Hemoglobin 12.0 - 15.0 g/dL 7.7(L) 7.8(L) 8.5(L)  Hematocrit 36 - 46 % 22.8(L) 23.7(L) 25.4(L)  Platelets 150 - 400 K/uL 121(L) 122(L) 122(L)    CMP Latest Ref Rng & Units 04/12/2020 04/11/2020 04/10/2020  Glucose 70 - 99 mg/dL 098(J) 191(Y) 99  BUN 6 - 20 mg/dL 78(G) 95(A) 21(H)  Creatinine 0.44 - 1.00 mg/dL 0.86 5.78 4.69  Sodium 135 - 145 mmol/L 136 134(L) 134(L)  Potassium 3.5 - 5.1 mmol/L 4.0 3.5 4.2  Chloride 98 - 111 mmol/L 100 99 100  CO2 22 - 32 mmol/L 25 23 24   Calcium 8.9 - 10.3 mg/dL ) 6.2(X) 5.2(W)  Total Protein 6.5 - 8.1 g/dL - 5.3(L) 5.6(L)  Total Bilirubin 0.3 - 1.2 mg/dL - 12.6(H) 12.9(H)  Alkaline Phos 38 - 126 U/L - 124 139(H)  AST 15 - 41 U/L - 117(H) 163(H)  ALT 0 - 44 U/L - 60(H) 70(H)      Impression/recommendation  Acute alcoholic liver disease started on prednisone ?Acute hepatitis secondary to alcohol  Severe hyponatremia causing encephalopathy- resolved  Anemia: hemoglobin drop of 5 g in 3 months Endoscopy only grade 1 esophageal varices- minimal gastropathy-  Ascites- s/p paracentesis- candida albicans in the culture- being treated like candida peritonitis eventhough WBC was  Only 70 ( 7% N)  On Fluconazole 400mg  once a day until 04/21/20  ID will sign off- call if needed

## 2020-04-15 NOTE — Progress Notes (Signed)
Occupational Therapy Treatment Patient Details Name: Jill Reyes MRN: [id number] DOB: 28-Oct-1975 Today's Date: 04/15/2020    History of present illness Per MD Notes: Jill Reyes is a 44 y.o. female with medical history significant for alcohol abuse who presented to the ER for evaluation of weakness which has progressively worsened over the last several weeks. Patient states that she was drinking a few bottle of vodka daily but quit about 2 weeks prior to her admission.  She also complained of nausea, vomiting, anorexia, abdominal distention and yellowish discoloration of her skin and eyes.Work-up revealed acute alcoholic hepatitis probable underlying alcoholic liver cirrhosis.  She was confused and this was thought to be due to hepatic encephalopathy.  She was treated with prednisone, lactulose and rifaximin.  She also had acute kidney injury and hyponatremia that were treated with IV fluids.  She was treated with empiric IV Rocephin for abnormal urinalysis, suspected UTI.  Urine culture did not show any growth.  She was transfused with 1 unit of packed red blood cells for severe anemia.  She had hypokalemia that was corrected.Hospital course was complicated by aspiration pneumonia and acute hypoxemic respiratory failure which occurred after patient choked/aspirated while taking lactulose.  She was started on IV Unasyn for aspiration pneumonia.  She required oxygen therapy briefly for acute hypoxemic respiratory failure.   OT comments  Jill Reyes was seen for OT treatment on this date. Upon arrival to room pt awake seated EOB eating ice cream. Pt reports muscle soreness/pain (not quantified) in BLE, abdomen, and L shoulder. Pt reports no concerns returning home this date as pt has family assistance near 24/7. Pt instructed in DME recs, importance of mobility for functional strengthening, falls prevetnion, toileting schedule, safe RW t/f technique, home/routines modifications, ECS, and HEP (SLR, seated  marches, chair tricep dips). Pt verbalized understanding of instruction provided. Pt making good progress toward goals. Pt continues to benefit from skilled OT services to maximize return to PLOF and minimize risk of future falls, injury, caregiver burden, and readmission. Will continue to follow POC. Discharge recommendation remains appropriate.    Follow Up Recommendations  Home health OT;Supervision/Assistance - 24 hour    Equipment Recommendations  3 in 1 bedside commode    Recommendations for Other Services      Precautions / Restrictions Precautions Precautions: Fall Restrictions Weight Bearing Restrictions: No       Mobility Bed Mobility Overal bed mobility: Modified Independent   General bed mobility comments: Received and left seated EOB   Transfers   General transfer comment: Pt deferred stating muscle pain. Return verbalized proper steps for t/f c RW     Balance Overall balance assessment: Needs assistance Sitting-balance support: No upper extremity supported;Feet supported Sitting balance-Leahy Scale: Good Sitting balance - Comments: Adequate trunk control c lateral leans inside BOS         ADL either performed or assessed with clinical judgement   ADL Overall ADL's : Needs assistance/impaired      General ADL Comments: MOD I self-feeding seated EOB. Anticipate MIN A for LBD seated EOB.       Cognition Arousal/Alertness: Awake/alert Behavior During Therapy: WFL for tasks assessed/performed Overall Cognitive Status: Impaired/Different from baseline Area of Impairment: Safety/judgement;Memory      General Comments: Redirection to questions t/o session        Exercises Exercises: Other exercises Other Exercises Other Exercises: Pt educated re: DME recs, d/c recs, importance of mobility for functional strengthening, falls prevetnion, toileting schedule, home/routines modifications, ECS, HEP  Other Exercises: Self-feeding, sitting balance/tolerance,  HEP demonstration (SLR, seated marches, chair tricep dips)   Shoulder Instructions       General Comments      Pertinent Vitals/ Pain       Pain Assessment: Faces Faces Pain Scale: Hurts little more Pain Location: low back, L shoulder, stomach pain Pain Descriptors / Indicators: Aching;Dull;Discomfort Pain Intervention(s): Limited activity within patient's tolerance;Monitored during session   Frequency  Min 2X/week        Progress Toward Goals  OT Goals(current goals can now be found in the care plan section)  Progress towards OT goals: Progressing toward goals  Acute Rehab OT Goals Patient Stated Goal: To have less pain OT Goal Formulation: With patient Time For Goal Achievement: 04/25/20 Potential to Achieve Goals: Fair ADL Goals Pt Will Perform Grooming: with modified independence;with set-up;standing (c LRAD PRN for improved safety and functional independence.) Pt Will Perform Upper Body Dressing: sitting;with modified independence Pt Will Perform Lower Body Dressing: sit to/from stand;with modified independence;with adaptive equipment (c LRAD PRN for improved safety and functional independence.) Pt Will Transfer to Toilet: bedside commode;with set-up;with supervision (c LRAD PRN for improved safety and functional independence.) Pt Will Perform Toileting - Clothing Manipulation and hygiene: sit to/from stand;with adaptive equipment;with supervision;with set-up;with caregiver independent in assisting (c LRAD PRN for improved safety and functional independence.)  Plan Discharge plan remains appropriate;Frequency remains appropriate    Co-evaluation                 AM-PAC OT "6 Clicks" Daily Activity     Outcome Measure   Help from another person eating meals?: None Help from another person taking care of personal grooming?: A Little Help from another person toileting, which includes using toliet, bedpan, or urinal?: A Little Help from another person bathing  (including washing, rinsing, drying)?: A Little Help from another person to put on and taking off regular upper body clothing?: A Little Help from another person to put on and taking off regular lower body clothing?: A Little 6 Click Score: 19    End of Session    OT Visit Diagnosis: Other abnormalities of gait and mobility (R26.89);Pain Pain - Right/Left:  (both) Pain - part of body: Shoulder (abdominal)   Activity Tolerance Patient limited by pain;Patient tolerated treatment well   Patient Left in bed;with call bell/phone within reach;with bed alarm set   Nurse Communication          Time: 7867-6720 OT Time Calculation (min): 15 min  Charges: OT General Charges $OT Visit: 1 Visit OT Treatments $Self Care/Home Management : 8-22 mins  Kathie Dike, M.S. OTR/L  04/15/20, 10:03 AM  ascom [phone]

## 2020-04-19 ENCOUNTER — Encounter: Payer: Self-pay | Admitting: Emergency Medicine

## 2020-04-19 ENCOUNTER — Emergency Department: Payer: Medicaid Other

## 2020-04-19 ENCOUNTER — Other Ambulatory Visit: Payer: Self-pay

## 2020-04-19 ENCOUNTER — Inpatient Hospital Stay
Admission: EM | Admit: 2020-04-19 | Discharge: 2020-04-23 | DRG: 433 | Disposition: A | Discharge status: Home Health | Payer: Medicaid Other | Attending: Internal Medicine | Admitting: Internal Medicine

## 2020-04-19 DIAGNOSIS — I8510 Secondary esophageal varices without bleeding: Secondary | ICD-10-CM | POA: Diagnosis present

## 2020-04-19 DIAGNOSIS — K5900 Constipation, unspecified: Secondary | ICD-10-CM | POA: Diagnosis present

## 2020-04-19 DIAGNOSIS — K7210 Chronic hepatic failure without coma: Secondary | ICD-10-CM

## 2020-04-19 DIAGNOSIS — K7011 Alcoholic hepatitis with ascites: Secondary | ICD-10-CM | POA: Diagnosis present

## 2020-04-19 DIAGNOSIS — Z79899 Other long term (current) drug therapy: Secondary | ICD-10-CM

## 2020-04-19 DIAGNOSIS — K652 Spontaneous bacterial peritonitis: Secondary | ICD-10-CM | POA: Diagnosis present

## 2020-04-19 DIAGNOSIS — E876 Hypokalemia: Secondary | ICD-10-CM | POA: Diagnosis present

## 2020-04-19 DIAGNOSIS — D649 Anemia, unspecified: Secondary | ICD-10-CM | POA: Diagnosis present

## 2020-04-19 DIAGNOSIS — K219 Gastro-esophageal reflux disease without esophagitis: Secondary | ICD-10-CM | POA: Diagnosis present

## 2020-04-19 DIAGNOSIS — K7031 Alcoholic cirrhosis of liver with ascites: Secondary | ICD-10-CM

## 2020-04-19 DIAGNOSIS — K7040 Alcoholic hepatic failure without coma: Secondary | ICD-10-CM | POA: Diagnosis present

## 2020-04-19 DIAGNOSIS — Z20822 Contact with and (suspected) exposure to covid-19: Secondary | ICD-10-CM | POA: Diagnosis present

## 2020-04-19 DIAGNOSIS — F1010 Alcohol abuse, uncomplicated: Secondary | ICD-10-CM | POA: Diagnosis present

## 2020-04-19 DIAGNOSIS — F17210 Nicotine dependence, cigarettes, uncomplicated: Secondary | ICD-10-CM | POA: Diagnosis present

## 2020-04-19 DIAGNOSIS — K529 Noninfective gastroenteritis and colitis, unspecified: Secondary | ICD-10-CM

## 2020-04-19 DIAGNOSIS — R109 Unspecified abdominal pain: Secondary | ICD-10-CM | POA: Diagnosis present

## 2020-04-19 DIAGNOSIS — R5381 Other malaise: Secondary | ICD-10-CM

## 2020-04-19 LAB — COMPREHENSIVE METABOLIC PANEL
ALT: 64 U/L — ABNORMAL HIGH (ref 0–44)
AST: 98 U/L — ABNORMAL HIGH (ref 15–41)
Albumin: 3.1 g/dL — ABNORMAL LOW (ref 3.5–5.0)
Alkaline Phosphatase: 243 U/L — ABNORMAL HIGH (ref 38–126)
Anion gap: 11 (ref 5–15)
BUN: 32 mg/dL — ABNORMAL HIGH (ref 6–20)
CO2: 26 mmol/L (ref 22–32)
Calcium: 8.9 mg/dL (ref 8.9–10.3)
Chloride: 105 mmol/L (ref 98–111)
Creatinine, Ser: 1.09 mg/dL — ABNORMAL HIGH (ref 0.44–1.00)
GFR calc Af Amer: >60 mL/min (ref >60)
GFR calc non Af Amer: >60 mL/min (ref >60)
Glucose, Bld: 114 mg/dL — ABNORMAL HIGH (ref 70–99)
Potassium: 4.2 mmol/L (ref 3.5–5.1)
Sodium: 142 mmol/L (ref 135–145)
Total Bilirubin: 8.7 mg/dL — ABNORMAL HIGH (ref 0.3–1.2)
Total Protein: 6.3 g/dL — ABNORMAL LOW (ref 6.5–8.1)

## 2020-04-19 LAB — CBC WITH DIFFERENTIAL/PLATELET
Abs Immature Granulocytes: 0.1 10*3/uL — ABNORMAL HIGH (ref 0.00–0.07)
Basophils Absolute: 0 10*3/uL (ref 0.0–0.1)
Basophils Relative: 0 %
Eosinophils Absolute: 0.4 10*3/uL (ref 0.0–0.5)
Eosinophils Relative: 2 %
HCT: 30.4 % — ABNORMAL LOW (ref 36.0–46.0)
Hemoglobin: 10 g/dL — ABNORMAL LOW (ref 12.0–15.0)
Immature Granulocytes: 1 %
Lymphocytes Relative: 12 %
Lymphs Abs: 2.1 10*3/uL (ref 0.7–4.0)
MCH: 34.8 pg — ABNORMAL HIGH (ref 26.0–34.0)
MCHC: 32.9 g/dL (ref 30.0–36.0)
MCV: 105.9 fL — ABNORMAL HIGH (ref 80.0–100.0)
Monocytes Absolute: 0.7 10*3/uL (ref 0.1–1.0)
Monocytes Relative: 4 %
Neutro Abs: 14.2 10*3/uL — ABNORMAL HIGH (ref 1.7–7.7)
Neutrophils Relative %: 81 %
Platelets: 171 10*3/uL (ref 150–400)
RBC: 2.87 MIL/uL — ABNORMAL LOW (ref 3.87–5.11)
RDW: 19.1 % — ABNORMAL HIGH (ref 11.5–15.5)
WBC: 17.5 10*3/uL — ABNORMAL HIGH (ref 4.0–10.5)
nRBC: 0 % (ref 0.0–0.2)

## 2020-04-19 LAB — PROTIME-INR
INR: 1.4 — ABNORMAL HIGH (ref 0.8–1.2)
Prothrombin Time: 16.4 seconds — ABNORMAL HIGH (ref 11.4–15.2)

## 2020-04-19 LAB — SARS CORONAVIRUS 2 BY RT PCR (HOSPITAL ORDER, PERFORMED IN ~~LOC~~ HOSPITAL LAB): SARS Coronavirus 2: NEGATIVE

## 2020-04-19 LAB — TROPONIN I (HIGH SENSITIVITY)
Troponin I (High Sensitivity): 20 ng/L — ABNORMAL HIGH (ref <18)
Troponin I (High Sensitivity): 17 ng/L (ref <18)

## 2020-04-19 LAB — AMMONIA: Ammonia: 33 umol/L (ref 9–35)

## 2020-04-19 LAB — LIPASE, BLOOD: Lipase: 86 U/L — ABNORMAL HIGH (ref 11–51)

## 2020-04-19 LAB — APTT: aPTT: 31 seconds (ref 24–36)

## 2020-04-19 MED ORDER — TRAZODONE HCL 50 MG PO TABS: 25.0000 mg | ORAL_TABLET | Freq: Every evening | ORAL | Status: DC | PRN

## 2020-04-19 MED ORDER — NICOTINE 21 MG/24HR TD PT24
21.0000 mg | MEDICATED_PATCH | Freq: Every day | TRANSDERMAL | Status: DC
  Administered 2020-04-20 – 2020-04-23 (×4): 21 mg via TRANSDERMAL
  Filled 2020-04-19 (×4): qty 1

## 2020-04-19 MED ORDER — ONDANSETRON HCL 4 MG/2ML IJ SOLN
4.0000 mg | Freq: Four times a day (QID) | INTRAMUSCULAR | Status: DC | PRN
  Administered 2020-04-21: 4 mg via INTRAVENOUS
  Filled 2020-04-19: qty 2

## 2020-04-19 MED ORDER — ADULT MULTIVITAMIN W/MINERALS CH
1.0000 | ORAL_TABLET | Freq: Every day | ORAL | Status: DC
  Administered 2020-04-20 – 2020-04-23 (×4): 1 via ORAL
  Filled 2020-04-19 (×4): qty 1

## 2020-04-19 MED ORDER — KETOROLAC TROMETHAMINE 15 MG/ML IJ SOLN
15.0000 mg | Freq: Four times a day (QID) | INTRAMUSCULAR | Status: DC | PRN
  Administered 2020-04-22: 15 mg via INTRAVENOUS
  Filled 2020-04-19 (×2): qty 1

## 2020-04-19 MED ORDER — POLYETHYLENE GLYCOL 3350 17 G PO PACK
17.0000 g | PACK | Freq: Every day | ORAL | Status: DC
  Administered 2020-04-21 – 2020-04-23 (×2): 17 g via ORAL
  Filled 2020-04-19 (×4): qty 1

## 2020-04-19 MED ORDER — SODIUM CHLORIDE 0.9 % IV SOLN
2.0000 g | Freq: Once | INTRAVENOUS | Status: AC
  Administered 2020-04-19: 2 g via INTRAVENOUS
  Filled 2020-04-19: qty 20

## 2020-04-19 MED ORDER — SODIUM CHLORIDE 0.9 % IV SOLN
2.0000 g | Freq: Two times a day (BID) | INTRAVENOUS | Status: DC
  Administered 2020-04-20: 2 g via INTRAVENOUS
  Filled 2020-04-19 (×3): qty 20

## 2020-04-19 MED ORDER — SODIUM CHLORIDE 0.9 % IV SOLN: INTRAVENOUS | Status: DC

## 2020-04-19 MED ORDER — METRONIDAZOLE IN NACL 5-0.79 MG/ML-% IV SOLN
500.0000 mg | Freq: Three times a day (TID) | INTRAVENOUS | Status: DC
  Administered 2020-04-20 – 2020-04-23 (×10): 500 mg via INTRAVENOUS
  Filled 2020-04-19 (×14): qty 100

## 2020-04-19 MED ORDER — PANTOPRAZOLE SODIUM 40 MG PO TBEC
40.0000 mg | DELAYED_RELEASE_TABLET | Freq: Every day | ORAL | Status: DC
  Administered 2020-04-19 – 2020-04-22 (×4): 40 mg via ORAL
  Filled 2020-04-19 (×5): qty 1

## 2020-04-19 MED ORDER — METRONIDAZOLE IN NACL 5-0.79 MG/ML-% IV SOLN
500.0000 mg | Freq: Once | INTRAVENOUS | Status: AC
  Administered 2020-04-19: 500 mg via INTRAVENOUS
  Filled 2020-04-19: qty 100

## 2020-04-19 MED ORDER — MELATONIN 5 MG PO TABS
5.0000 mg | ORAL_TABLET | Freq: Every day | ORAL | Status: DC
  Administered 2020-04-19 – 2020-04-22 (×4): 5 mg via ORAL
  Filled 2020-04-19 (×5): qty 1

## 2020-04-19 MED ORDER — ENOXAPARIN SODIUM 40 MG/0.4ML ~~LOC~~ SOLN
40.0000 mg | SUBCUTANEOUS | Status: DC
  Administered 2020-04-19 – 2020-04-22 (×4): 40 mg via SUBCUTANEOUS
  Filled 2020-04-19 (×4): qty 0.4

## 2020-04-19 MED ORDER — FUROSEMIDE 20 MG PO TABS
20.0000 mg | ORAL_TABLET | Freq: Every day | ORAL | Status: DC
  Administered 2020-04-20: 20 mg via ORAL
  Filled 2020-04-19: qty 1

## 2020-04-19 MED ORDER — SPIRONOLACTONE 25 MG PO TABS
50.0000 mg | ORAL_TABLET | Freq: Every day | ORAL | Status: DC
  Administered 2020-04-20 – 2020-04-23 (×4): 50 mg via ORAL
  Filled 2020-04-19 (×5): qty 2

## 2020-04-19 MED ORDER — THIAMINE HCL 100 MG PO TABS
100.0000 mg | ORAL_TABLET | Freq: Every day | ORAL | Status: DC
  Administered 2020-04-20 – 2020-04-23 (×4): 100 mg via ORAL
  Filled 2020-04-19 (×4): qty 1

## 2020-04-19 MED ORDER — ONDANSETRON HCL 4 MG PO TABS
4.0000 mg | ORAL_TABLET | Freq: Four times a day (QID) | ORAL | Status: DC | PRN
  Administered 2020-04-22 (×2): 4 mg via ORAL
  Filled 2020-04-19 (×2): qty 1

## 2020-04-19 MED ORDER — RIFAXIMIN 550 MG PO TABS
550.0000 mg | ORAL_TABLET | Freq: Two times a day (BID) | ORAL | Status: DC
  Administered 2020-04-19 – 2020-04-23 (×8): 550 mg via ORAL
  Filled 2020-04-19 (×11): qty 1

## 2020-04-19 MED ORDER — LORATADINE 10 MG PO TABS
10.0000 mg | ORAL_TABLET | Freq: Every day | ORAL | Status: DC
  Administered 2020-04-20 – 2020-04-23 (×4): 10 mg via ORAL
  Filled 2020-04-19 (×4): qty 1

## 2020-04-19 MED ORDER — MORPHINE SULFATE (PF) 2 MG/ML IV SOLN
2.0000 mg | INTRAVENOUS | Status: DC | PRN
  Administered 2020-04-19 – 2020-04-20 (×3): 2 mg via INTRAVENOUS
  Filled 2020-04-19 (×3): qty 1

## 2020-04-19 MED ORDER — FLUCONAZOLE 100 MG PO TABS
400.0000 mg | ORAL_TABLET | Freq: Every day | ORAL | Status: DC
  Administered 2020-04-19 – 2020-04-22 (×4): 400 mg via ORAL
  Filled 2020-04-19 (×5): qty 4

## 2020-04-19 MED ORDER — FOLIC ACID 1 MG PO TABS
1.0000 mg | ORAL_TABLET | Freq: Every day | ORAL | Status: DC
  Administered 2020-04-20 – 2020-04-23 (×4): 1 mg via ORAL
  Filled 2020-04-19 (×4): qty 1

## 2020-04-19 NOTE — ED Triage Notes (Signed)
Pt presents to ED via [facility] with c/o abdominal pain. Pt states was seen earlier in Aug for same. Pt noted to be tearful on arrival to ED, pt noted to be tachycardic and tearful in triage. Pt states she has "totally lost her train of thought". Pt c/o RLQ and LUQ pain upon arrival to ED. Pt with slight jaundice noted to eyes, pt noted to be shaking upon arrival to ED.

## 2020-04-19 NOTE — ED Notes (Signed)
Pt's care discussed with Dr. Scotty Court, [facility] for bloodwork and CT Head. No orders for fluids at this time.

## 2020-04-19 NOTE — ED Triage Notes (Signed)
By ems.  abd pain no better since discharge couple weeks ago.  Hx liver.  Edema.  Hr 130.

## 2020-04-19 NOTE — ED Notes (Signed)
Pt states abdominal pain. Pt states she had a hernia repair a bit ago, but does not recall when. Pt states she also has liver failure. Yellowing to the eyes noted and the skin.

## 2020-04-19 NOTE — ED Notes (Signed)
RN attempted to call report again. Marchelle Folks, RN did not answer. As per charge RN, pt can go upstairs. Beth, NT is taking pt up to room.

## 2020-04-19 NOTE — ED Notes (Signed)
RN attempted to call report 

## 2020-04-19 NOTE — H&P (Addendum)
[place]   PATIENT NAME: Jill Reyes    MR#:  [id number]  DATE OF BIRTH:  04/26/1976  DATE OF ADMISSION:  04/19/2020  PRIMARY CARE PHYSICIAN: Patient, No Pcp Per   REQUESTING/REFERRING PHYSICIAN: Duffy Bruce, MD  CHIEF COMPLAINT:   Chief Complaint  Patient presents with  . Abdominal Pain    HISTORY OF PRESENT ILLNESS:  Jill Reyes  is a 44 y.o. Caucasian female with a known history of alcoholic liver cirrhosis, portal hypertension with esophageal varices, who presented to the emergency room with acute onset of diffuse abdominal pain that started around the sites where she had paracentesis. She was recently admitted here on 8/5 till 8/16 and managed for hepatic encephalopathy and acute kidney injury, pneumonia, hypokalemia and hyponatremia.  She has been having altered mental status.  She denies any fever or chills.  She denies any nausea or vomiting or diarrhea.  She admitted to constipation.  She denied any cough or dyspnea but admits to occasional wheezing.  She has been having urinary frequency without dysuria oliguria or hematuria or flank pain.  She continues to have bilateral lower extremity edema.  No chest pain or palpitations.  Plan presentation to the emergency room vital signs were within normal except for heart rate of 132.  Labs were remarkable for a BUN of 32 with a creatinine 1.09 alk phos of 243, lipase of 86, AST 98 ALT 64 slightly better and total protein 6.3 with albumin of 3.1 up from 2.9.  Ammonia levels 33 compared to 51 on 8/11.  Total bili was 8.7 compared to 9.8 on 8/16.  High-sensitivity troponin was 20 and later 17.  CBC showed leukocytosis 17.5 compared to 23.7 on 8/16 with neutrophilia and hemoglobin hematocrits were 10/30.4 compared to 8.4/25.8.  INR is 1.4 and PT 16.4. EKG showed sinus tachycardia with a rate of 133.  Noncontrasted head CT scan revealed no acute intracranial normalities.  It showed generalized atrophy.  Portable chest x-ray  showed mild residual hazy infiltrate persists at the mid and lower lungs with overall radiographic improvement.  Abdominal pelvic CT scan revealed the following: 1. Mild left basilar and moderate severity right basilar atelectasis and/or infiltrate. 2. Small right pleural effusion. 3. Mild to moderate severity thickening of the transverse colon which may represent an infectious or inflammatory colitis. 4. Noninflamed diverticula throughout the large bowel. 5. 2.0 cm diameter left adnexal cyst, likely ovarian in origin. 6. Moderate to marked severity edema within the lateral and posterolateral aspects of the bilateral abdominal and pelvic wall. 7. Aortic atherosclerosis.  The patient was given 2 g of IV Rocephin and 5 mg IV Flagyl.  She will be admitted to a medically monitored bed for further evaluation and management.  PAST MEDICAL HISTORY:  Alcoholic liver cirrhosis Portal hypertension with esophageal varices Hyponatremia Folate deficiency Tobacco abuse Perianal abscess Lung nodule Bilateral lower extremity claudications.  PAST SURGICAL HISTORY:   Past Surgical History:  Procedure Laterality Date  . CARPAL TUNNEL RELEASE    . CESAREAN SECTION    . ESOPHAGOGASTRODUODENOSCOPY (EGD) WITH PROPOFOL N/A 04/13/2020   Procedure: ESOPHAGOGASTRODUODENOSCOPY (EGD) WITH PROPOFOL;  Surgeon: Lucilla Lame, MD;  Location: [facility] ENDOSCOPY;  Service: Endoscopy;  Laterality: N/A;  . HEMORRHOID SURGERY N/A 12/25/2019   Procedure: HEMORRHOIDECTOMY;  Surgeon: Benjamine Sprague, DO;  Location: [facility] ORS;  Service: General;  Laterality: N/A;  . INCISION AND DRAINAGE PERIRECTAL ABSCESS N/A 12/25/2019   Procedure: IRRIGATION AND DEBRIDEMENT PERIRECTAL ABSCESS;  Surgeon: Benjamine Sprague,  DO;  Location: [facility] ORS;  Service: General;  Laterality: N/A;  . TUBAL LIGATION      SOCIAL HISTORY:   Social History   Tobacco Use  . Smoking status: Current Every Day Smoker    Types: Cigarettes  . Smokeless tobacco: Never  Used  Substance Use Topics  . Alcohol use: Yes    Comment: dailhy drinker, 2-3 beers a day, was a former liquor drinker    FAMILY HISTORY:  History reviewed. No pertinent family history.  DRUG ALLERGIES:   Allergies  Allergen Reactions  . Acetaminophen Shortness Of Breath    REVIEW OF SYSTEMS:   ROS As per history of present illness. All pertinent systems were reviewed above. Constitutional, HEENT, cardiovascular, respiratory, GI, GU, musculoskeletal, neuro, psychiatric, endocrine, integumentary and hematologic systems were reviewed and are otherwise negative/unremarkable except for positive findings mentioned above in the HPI.   MEDICATIONS AT HOME:   Prior to Admission medications   Medication Sig Start Date End Date Taking? Authorizing Provider  fluconazole (DIFLUCAN) 200 MG tablet Take 2 tablets (400 mg total) by mouth daily for 7 days. 04/15/20 04/22/20  Swayze, Ava, DO  folic acid (FOLVITE) 1 MG tablet Take 1 tablet (1 mg total) by mouth daily. 04/15/20   Swayze, Ava, DO  furosemide (LASIX) 20 MG tablet Take 1 tablet (20 mg total) by mouth daily after breakfast. 04/15/20   Swayze, Ava, DO  loratadine (CLARITIN) 10 MG tablet Take 1 tablet (10 mg total) by mouth daily. 12/26/19   Ezekiel Slocumb, DO  megestrol (MEGACE) 40 MG tablet Take 1 tablet (40 mg total) by mouth daily. 04/15/20   Swayze, Ava, DO  melatonin 5 MG TABS Take 1 tablet (5 mg total) by mouth at bedtime. 04/14/20   Swayze, Ava, DO  Multiple Vitamin (MULTIVITAMIN WITH MINERALS) TABS tablet Take 1 tablet by mouth daily. 04/15/20   Swayze, Ava, DO  nicotine (NICODERM CQ - DOSED IN MG/24 HOURS) 21 mg/24hr patch Place 1 patch (21 mg total) onto the skin daily. 01/27/20   Dhungel, Nishant, MD  pantoprazole (PROTONIX) 40 MG tablet Take 1 tablet (40 mg total) by mouth daily. 04/14/20 04/14/21  Swayze, Ava, DO  polyethylene glycol (MIRALAX / GLYCOLAX) 17 g packet Take 17 g by mouth daily. 04/15/20   Swayze, Ava, DO  predniSONE  (DELTASONE) 20 MG tablet Take 2 tablets (40 mg total) by mouth daily with breakfast for 3 days, THEN 1.5 tablets (30 mg total) daily with breakfast for 4 days, THEN 1 tablet (20 mg total) daily with breakfast for 4 days, THEN 0.5 tablets (10 mg total) daily with breakfast for 4 days. 04/15/20 04/30/20  Swayze, Ava, DO  rifaximin (XIFAXAN) 550 MG TABS tablet Take 1 tablet (550 mg total) by mouth 2 (two) times daily. 04/14/20   Swayze, Ava, DO  spironolactone (ALDACTONE) 50 MG tablet Take 1 tablet (50 mg total) by mouth daily after breakfast. 04/15/20   Swayze, Ava, DO  thiamine 100 MG tablet Take 1 tablet (100 mg total) by mouth daily. 04/15/20   Swayze, Ava, DO  traMADol (ULTRAM) 50 MG tablet Take 1 tablet (50 mg total) by mouth 3 (three) times daily as needed for moderate pain. 04/14/20   Swayze, Ava, DO      VITAL SIGNS:  Blood pressure 127/81, pulse (!) 133, temperature 98.9 F (37.2 C), temperature source Oral, resp. rate 20, height '5\' 8"'  (1.727 m), weight 61.1 kg, last menstrual period 03/28/2020, SpO2 97 %.  PHYSICAL EXAMINATION:  Physical Exam  GENERAL:  44 y.o.-year-old mildly lethargic Caucasian female patient lying in the bed with no acute distress.  EYES: Pupils equal, round, reactive to light and accommodation.  Positive scleral icterus. Extraocular muscles intact.  HEENT: Head atraumatic, normocephalic. Oropharynx and nasopharynx clear.  NECK:  Supple, no jugular venous distention. No thyroid enlargement, no tenderness.  LUNGS: Normal breath sounds bilaterally, no wheezing, rales,rhonchi or crepitation. No use of accessory muscles of respiration.  CARDIOVASCULAR: Regular rate and rhythm, S1, S2 normal. No murmurs, rubs, or gallops.  ABDOMEN: Soft, mildly distended with diffuse mild tenderness without rebound tenderness guarding or rigidity.  Bowel sounds present. No organomegaly or mass.  EXTREMITIES: 2+-3+ bilateral lower extremity soft pitting edema, with no cyanosis, or clubbing.   NEUROLOGIC: Cranial nerves II through XII are intact. Muscle strength 5/5 in all extremities. Sensation intact. Gait not checked.  PSYCHIATRIC: The patient is alert but slow to respond to questions.  Normal affect and good eye contact. SKIN: No obvious rash, lesion, or ulcer.   LABORATORY PANEL:   CBC Recent Labs  Lab 04/19/20 1214  WBC 17.5*  HGB 10.0*  HCT 30.4*  PLT 171   ------------------------------------------------------------------------------------------------------------------  Chemistries  Recent Labs  Lab 04/19/20 1214  NA 142  K 4.2  CL 105  CO2 26  GLUCOSE 114*  BUN 32*  CREATININE 1.09*  CALCIUM 8.9  AST 98*  ALT 64*  ALKPHOS 243*  BILITOT 8.7*   ------------------------------------------------------------------------------------------------------------------  Cardiac Enzymes No results for input(s): TROPONINI in the last 168 hours. ------------------------------------------------------------------------------------------------------------------  RADIOLOGY:  CT ABDOMEN PELVIS WO CONTRAST  Result Date: 04/19/2020 CLINICAL DATA:  Abdominal pain and fever. EXAM: CT ABDOMEN AND PELVIS WITHOUT CONTRAST TECHNIQUE: Multidetector CT imaging of the abdomen and pelvis was performed following the standard protocol without IV contrast. COMPARISON:  December 22, 2019 FINDINGS: Lower chest: Mild left basilar and moderate severity right basilar atelectasis and/or infiltrate. There is a small right pleural effusion. Hepatobiliary: No focal liver abnormality is seen. No gallstones, gallbladder wall thickening, or biliary dilatation. Pancreas: Unremarkable. No pancreatic ductal dilatation or surrounding inflammatory changes. Spleen: Normal in size without focal abnormality. Adrenals/Urinary Tract: Adrenal glands are unremarkable. Kidneys are normal, without renal calculi, focal lesion, or hydronephrosis. Bladder is unremarkable. Stomach/Bowel: Stomach is within normal limits.  The appendix is not clearly identified. No evidence of bowel dilatation. Mild to moderate severity thickening of the transverse colon is seen. Noninflamed diverticula are seen throughout the large bowel. Vascular/Lymphatic: There is mild calcification of the abdominal aorta and bilateral common iliac arteries, without evidence of aneurysmal dilatation. No enlarged abdominal or pelvic lymph nodes. Reproductive: The uterus is normal in size and appearance. A 2.0 cm diameter left adnexal cyst is noted. Other: No abdominal wall hernia or abnormality. A moderate amount of abdominal and pelvic free fluid is seen. Musculoskeletal: Moderate to marked severity edema is seen within the lateral and posterolateral aspects of the bilateral abdominal and pelvic wall. Moderate to marked severity degenerative changes seen at the levels of L4-L5 and L5-S1. IMPRESSION: 1. Mild left basilar and moderate severity right basilar atelectasis and/or infiltrate. 2. Small right pleural effusion. 3. Mild to moderate severity thickening of the transverse colon which may represent an infectious or inflammatory colitis. 4. Noninflamed diverticula throughout the large bowel. 5. 2.0 cm diameter left adnexal cyst, likely ovarian in origin. 6. Moderate to marked severity edema within the lateral and posterolateral aspects of the bilateral abdominal and pelvic wall. 7. Aortic atherosclerosis. Aortic Atherosclerosis (ICD10-I70.0). Electronically  Signed   By: Virgina Norfolk M.D.   On: 04/19/2020 18:56   CT Head Wo Contrast  Result Date: 04/19/2020 CLINICAL DATA:  Mental status change. EXAM: CT HEAD WITHOUT CONTRAST TECHNIQUE: Contiguous axial images were obtained from the base of the skull through the vertex without intravenous contrast. COMPARISON:  CT head 04/06/2020 FINDINGS: Brain: Mild atrophy without hydrocephalus. Negative for acute infarct, hemorrhage, mass Vascular: Negative for hyperdense vessel Skull: Negative Sinuses/Orbits:  Paranasal sinuses clear.  Negative orbit Other: None IMPRESSION: No acute abnormality and  no interval change.  Generalized atrophy. Electronically Signed   By: Franchot Gallo M.D.   On: 04/19/2020 12:50   DG Chest Portable 1 View  Result Date: 04/19/2020 CLINICAL DATA:  Shortness of breath. Confusion. Follow-up pneumonia. EXAM: PORTABLE CHEST 1 VIEW COMPARISON:  04/09/2020 FINDINGS: Considerable radiographic improvement. Mild hazy infiltrate persists in the mid and lower lungs. No worsening or new finding. No effusion. Upper lungs remain clear. IMPRESSION: Radiographic improvement. Only mild residual hazy infiltrate persists at the mid and lower lungs. Electronically Signed   By: Nelson Chimes M.D.   On: 04/19/2020 17:47      IMPRESSION AND PLAN:   1.  Highly suspected spontaneous bacterial peritonitis in the setting of alcoholic liver cirrhosis with diffuse abdominal pain with associated acute transverse colitis. -The patient will be admitted to a medical monitored bed. -We will continue antibiotic therapy with IV Rocephin 2 g every 12 hours and IV Flagyl 500 mg IV every 8 hours. -GI consultation will be obtained. -We will continue rifaximin. -Pain management will be provided. -Guided paracentesis will be attempted  2.  Sepsis without severe sepsis or septic shock secondary to #1.  This is manifested by leukocytosis and tachycardia.  She has ultimate this as that could be related to early hepatic encephalopathy though her ammonia level is not elevated.  I do not believe that this constitutes severe sepsis. -We will follow blood cultures and continue above-mentioned antibiotics. -She will be hydrated with IV normal saline.  3.  Alcoholic liver cirrhosis with hepatic cell failure with elevated PT and INR.  Platelets are normal though. -We will continue rifaximin. -GI consultation will be obtained. -She was counseled for cessation of alcohol. -We we will watch her for alcohol withdrawal and  utilize as needed IV Ativan as needed especially given her altered mental status. -We will continue Aldactone.  4.  GERD and history of esophageal varices. -We will continue PPI therapy.  5.  Anemia, currently better. -We will follow CBC.  6.  Tobacco abuse. -She was counseled for smoking cessation and will receive further counseling here.   7.  DVT prophylaxis. -Subcutaneous Lovenox for now given normal platelets.  All the records are reviewed and case discussed with ED provider. The plan of care was discussed in details with the patient (and family). I answered all questions. The patient agreed to proceed with the above mentioned plan. Further management will depend upon hospital course.   CODE STATUS: Full code  Status is: Inpatient  Remains inpatient appropriate because:Ongoing active pain requiring inpatient pain management, Altered mental status, Ongoing diagnostic testing needed not appropriate for outpatient work up, Unsafe d/c plan, IV treatments appropriate due to intensity of illness or inability to take PO and Inpatient level of care appropriate due to severity of illness   Dispo: The patient is from: Home              Anticipated d/c is to: Home  Anticipated d/c date is: 3 days              Patient currently is not medically stable to d/c.   TOTAL TIME TAKING CARE OF THIS PATIENT: 55 minutes.    Christel Mormon M.D on 04/19/2020 at 7:48 PM  [facility]   From 7 PM-7 AM, contact night-coverage [email or web address]  CC: Primary care physician; Patient, No Pcp Per   Note: This dictation was prepared with Dragon dictation along with smaller phrase technology. Any transcriptional typo errors that result from this process are unintentional.

## 2020-04-19 NOTE — ED Notes (Signed)
Pts mother called and mother given update with pts permission.

## 2020-04-19 NOTE — ED Notes (Signed)
Patient assigned to appropriate care area   Introduced self to pt  Patient oriented to unit/care area.  Environment secured

## 2020-04-19 NOTE — ED Notes (Signed)
RN attempted to call report to floor

## 2020-04-20 ENCOUNTER — Inpatient Hospital Stay: Payer: Medicaid Other

## 2020-04-20 DIAGNOSIS — I8510 Secondary esophageal varices without bleeding: Secondary | ICD-10-CM

## 2020-04-20 DIAGNOSIS — K7030 Alcoholic cirrhosis of liver without ascites: Secondary | ICD-10-CM

## 2020-04-20 LAB — BASIC METABOLIC PANEL
Anion gap: 12 (ref 5–15)
BUN: 28 mg/dL — ABNORMAL HIGH (ref 6–20)
CO2: 25 mmol/L (ref 22–32)
Calcium: 8.4 mg/dL — ABNORMAL LOW (ref 8.9–10.3)
Chloride: 107 mmol/L (ref 98–111)
Creatinine, Ser: 0.9 mg/dL (ref 0.44–1.00)
GFR calc Af Amer: >60 mL/min (ref >60)
GFR calc non Af Amer: >60 mL/min (ref >60)
Glucose, Bld: 77 mg/dL (ref 70–99)
Potassium: 3.4 mmol/L — ABNORMAL LOW (ref 3.5–5.1)
Sodium: 144 mmol/L (ref 135–145)

## 2020-04-20 LAB — BODY FLUID CELL COUNT WITH DIFFERENTIAL
Eos, Fluid: UNDETERMINED %
Lymphs, Fluid: UNDETERMINED %
Monocyte-Macrophage-Serous Fluid: UNDETERMINED %
Neutrophil Count, Fluid: UNDETERMINED %
Total Nucleated Cell Count, Fluid: 267 cu mm

## 2020-04-20 LAB — ALBUMIN, PLEURAL OR PERITONEAL FLUID: Albumin, Fluid: <1 g/dL

## 2020-04-20 LAB — CBC
HCT: 27.5 % — ABNORMAL LOW (ref 36.0–46.0)
Hemoglobin: 9.2 g/dL — ABNORMAL LOW (ref 12.0–15.0)
MCH: 35.8 pg — ABNORMAL HIGH (ref 26.0–34.0)
MCHC: 33.5 g/dL (ref 30.0–36.0)
MCV: 107 fL — ABNORMAL HIGH (ref 80.0–100.0)
Platelets: 126 10*3/uL — ABNORMAL LOW (ref 150–400)
RBC: 2.57 MIL/uL — ABNORMAL LOW (ref 3.87–5.11)
RDW: 19.1 % — ABNORMAL HIGH (ref 11.5–15.5)
WBC: 14.3 10*3/uL — ABNORMAL HIGH (ref 4.0–10.5)
nRBC: 0 % (ref 0.0–0.2)

## 2020-04-20 LAB — MAGNESIUM: Magnesium: 2.2 mg/dL (ref 1.7–2.4)

## 2020-04-20 LAB — AMYLASE, PLEURAL OR PERITONEAL FLUID: Amylase, Fluid: 14 U/L

## 2020-04-20 LAB — PROTIME-INR
INR: 1.5 — ABNORMAL HIGH (ref 0.8–1.2)
Prothrombin Time: 17.3 seconds — ABNORMAL HIGH (ref 11.4–15.2)

## 2020-04-20 LAB — LACTATE DEHYDROGENASE, PLEURAL OR PERITONEAL FLUID: LD, Fluid: 71 U/L — ABNORMAL HIGH (ref 3–23)

## 2020-04-20 LAB — PROTEIN, PLEURAL OR PERITONEAL FLUID: Total protein, fluid: <3 g/dL

## 2020-04-20 LAB — PHOSPHORUS: Phosphorus: 3.3 mg/dL (ref 2.5–4.6)

## 2020-04-20 LAB — CORTISOL-AM, BLOOD: Cortisol - AM: 18.3 ug/dL (ref 6.7–22.6)

## 2020-04-20 LAB — PROCALCITONIN: Procalcitonin: 0.38 ng/mL

## 2020-04-20 MED ORDER — POTASSIUM CHLORIDE CRYS ER 20 MEQ PO TBCR
40.0000 meq | EXTENDED_RELEASE_TABLET | Freq: Once | ORAL | Status: AC
  Administered 2020-04-20: 40 meq via ORAL
  Filled 2020-04-20: qty 2

## 2020-04-20 MED ORDER — LACTULOSE 10 GM/15ML PO SOLN
20.0000 g | Freq: Two times a day (BID) | ORAL | Status: DC
  Administered 2020-04-20 – 2020-04-21 (×3): 20 g via ORAL
  Filled 2020-04-20 (×6): qty 30

## 2020-04-20 MED ORDER — SODIUM CHLORIDE 0.9 % IV SOLN
2.0000 g | INTRAVENOUS | Status: DC
  Administered 2020-04-21 – 2020-04-23 (×3): 2 g via INTRAVENOUS
  Filled 2020-04-20: qty 2
  Filled 2020-04-20 (×2): qty 20
  Filled 2020-04-20: qty 2

## 2020-04-20 MED ORDER — FUROSEMIDE 10 MG/ML IJ SOLN
40.0000 mg | Freq: Two times a day (BID) | INTRAMUSCULAR | Status: DC
  Administered 2020-04-20 – 2020-04-22 (×4): 40 mg via INTRAVENOUS
  Filled 2020-04-20 (×4): qty 4

## 2020-04-20 NOTE — Procedures (Signed)
Interventional Radiology Procedure Note  Procedure: US paracentesis    Complications: None  Estimated Blood Loss:  min  Findings: 1.3L  Removed Labs sent  Sharen Counter, MD

## 2020-04-20 NOTE — Progress Notes (Addendum)
Progress Note    Jill Reyes  MBT:[id number] DOB: 1976-05-05  DOA: 04/19/2020 PCP: Patient, No Pcp Per      Brief Narrative:    Medical records reviewed and are as summarized below:  Jill Reyes is a 44 y.o. female       Assessment/Plan:   Principal Problem:   SBP (spontaneous bacterial peritonitis) (HCC) Active Problems:   Esophageal varices in alcoholic cirrhosis (HCC)   Suspected spontaneous bacterial peritonitis, ascites Alcoholic liver cirrhosis with coagulopathy, thrombocytopenia and anasarca Portal hypertension and grade 1 esophageal varices Sinus tachycardia (patient had persistent sinus tachycardia on her last admission) Persistent leukocytosis Hepatic encephalopathy Alcohol use disorder Tobacco use disorder No evidence of sepsis.  Sepsis ruled out  PLAN  S/p paracentesis with removal of 1.3 L of yellowish peritoneal fluid. Ascitic fluid analysis pending Continue empiric IV antibiotics Change oral Lasix to IV Lasix.  Continue Aldactone Discontinue IV fluids Continue rifaximin Start lactulose   Body mass index is 22.5 kg/m.  Diet Order            Diet 2 gram sodium Room service appropriate? Yes; Fluid consistency: Thin  Diet effective now                        Medications:   . enoxaparin (LOVENOX) injection  40 mg Subcutaneous Q24H  . fluconazole  400 mg Oral q1800  . folic acid  1 mg Oral Daily  . furosemide  40 mg Intravenous BID  . lactulose  20 g Oral BID  . loratadine  10 mg Oral Daily  . melatonin  5 mg Oral QHS  . multivitamin with minerals  1 tablet Oral Daily  . nicotine  21 mg Transdermal Daily  . pantoprazole  40 mg Oral Daily  . polyethylene glycol  17 g Oral Daily  . potassium chloride  40 mEq Oral Once  . rifaximin  550 mg Oral BID  . spironolactone  50 mg Oral QPC breakfast  . thiamine  100 mg Oral Daily   Continuous Infusions: . cefTRIAXone (ROCEPHIN)  IV    . metronidazole 500 mg (04/20/20 0654)      Anti-infectives (From admission, onward)   Start     Dose/Rate Route Frequency Ordered Stop   04/20/20 1000  cefTRIAXone (ROCEPHIN) 2 g in sodium chloride 0.9 % 100 mL IVPB        2 g 200 mL/hr over 30 Minutes Intravenous Every 12 hours 04/19/20 1947     04/20/20 0600  metroNIDAZOLE (FLAGYL) IVPB 500 mg        500 mg 100 mL/hr over 60 Minutes Intravenous Every 8 hours 04/19/20 1947     04/19/20 2200  rifaximin (XIFAXAN) tablet 550 mg        550 mg Oral 2 times daily 04/19/20 1947     04/19/20 2100  fluconazole (DIFLUCAN) tablet 400 mg        400 mg Oral Daily-1800 04/19/20 1947     04/19/20 1915  metroNIDAZOLE (FLAGYL) IVPB 500 mg        500 mg 100 mL/hr over 60 Minutes Intravenous  Once 04/19/20 1904 04/19/20 2257   04/19/20 1815  cefTRIAXone (ROCEPHIN) 2 g in sodium chloride 0.9 % 100 mL IVPB        2 g 200 mL/hr over 30 Minutes Intravenous  Once 04/19/20 1807 04/19/20 2107             Family Communication/Anticipated  D/C date and plan/Code Status   DVT prophylaxis: Place and maintain sequential compression device Start: 04/20/20 0652 enoxaparin (LOVENOX) injection 40 mg Start: 04/19/20 2200     Code Status: Full Code  Family Communication: Plan discussed with patient Disposition Plan:    Status is: Inpatient  Remains inpatient appropriate because:IV treatments appropriate due to intensity of illness or inability to take PO and Inpatient level of care appropriate due to severity of illness   Dispo: The patient is from: Home              Anticipated d/c is to: Home              Anticipated d/c date is: 3 days              Patient currently is not medically stable to d/c.           Subjective:   C/o abdominal pain  Objective:    Vitals:   04/20/20 0800 04/20/20 1020 04/20/20 1047 04/20/20 1155  BP: 126/87 130/90 117/89 122/79  Pulse: (!) 109 (!) 117 100 (!) 103  Resp: 17   16  Temp: 98.1 F (36.7 C)   98 F (36.7 C)  TempSrc: Oral    Oral  SpO2: 100% 100% 100% 100%  Weight:      Height:       No data found.   Intake/Output Summary (Last 24 hours) at 04/20/2020 1212 Last data filed at 04/20/2020 0600 Gross per 24 hour  Intake 677.71 ml  Output --  Net 677.71 ml   Filed Weights   04/19/20 1202 04/19/20 2230  Weight: 61.1 kg 67.1 kg    Exam:  GEN: NAD SKIN: Bruises on b/l upper extremities. Spider nevi on chest. Jaundice EYES: EOMI, icteric ENT: MMM CV: RRR PULM: CTA B ABD: soft, mild distension, NT, +BS CNS: AAO x 3 but conversation is confused, non focal EXT: B/l leg edema (3+), no tenderness    Data Reviewed:   I have personally reviewed following labs and imaging studies:  Labs: Labs show the following:   Basic Metabolic Panel: Recent Labs  Lab 04/15/20 1143 04/15/20 1143 04/19/20 1214 04/20/20 0419  NA 135  --  142 144  K 4.3   < > 4.2 3.4*  CL 101  --  105 107  CO2 22  --  26 25  GLUCOSE 125*  --  114* 77  BUN 31*  --  32* 28*  CREATININE 0.93  --  1.09* 0.90  CALCIUM 8.6*  --  8.9 8.4*   < > = values in this interval not displayed.   GFR Estimated Creatinine Clearance: 82.1 mL/min (by C-G formula based on SCr of 0.9 mg/dL). Liver Function Tests: Recent Labs  Lab 04/15/20 1143 04/19/20 1214  AST 126* 98*  ALT 68* 64*  ALKPHOS 192* 243*  BILITOT 9.8* 8.7*  PROT 5.7* 6.3*  ALBUMIN 2.9* 3.1*   Recent Labs  Lab 04/19/20 1214  LIPASE 86*   Recent Labs  Lab 04/19/20 1214  AMMONIA 33   Coagulation profile Recent Labs  Lab 04/19/20 1214 04/20/20 0419  INR 1.4* 1.5*    CBC: Recent Labs  Lab 04/15/20 1143 04/19/20 1214 04/20/20 0419  WBC 23.7* 17.5* 14.3*  NEUTROABS 21.5* 14.2*  --   HGB 8.4* 10.0* 9.2*  HCT 25.8* 30.4* 27.5*  MCV 107.1* 105.9* 107.0*  PLT 181 171 126*   Cardiac Enzymes: No results for input(s): CKTOTAL, CKMB, CKMBINDEX, TROPONINI in  the last 168 hours. BNP (last 3 results) No results for input(s): PROBNP in the last 8760  hours. CBG: No results for input(s): GLUCAP in the last 168 hours. D-Dimer: No results for input(s): DDIMER in the last 72 hours. Hgb A1c: No results for input(s): HGBA1C in the last 72 hours. Lipid Profile: No results for input(s): CHOL, HDL, LDLCALC, TRIG, CHOLHDL, LDLDIRECT in the last 72 hours. Thyroid function studies: No results for input(s): TSH, T4TOTAL, T3FREE, THYROIDAB in the last 72 hours.  Invalid input(s): FREET3 Anemia work up: No results for input(s): VITAMINB12, FOLATE, FERRITIN, TIBC, IRON, RETICCTPCT in the last 72 hours. Sepsis Labs: Recent Labs  Lab 04/15/20 1143 04/19/20 1214 04/20/20 0419  PROCALCITON  --   --  0.38  WBC 23.7* 17.5* 14.3*    Microbiology Recent Results (from the past 240 hour(s))  Body fluid culture     Status: None   Collection Time: 04/10/20  4:08 PM   Specimen: PATH Cytology Peritoneal fluid  Result Value Ref Range Status   Specimen Description   Final    PERITONEAL Performed at [facility], [address], [place], [place] [address]    Special Requests   Final    NONE Performed at [facility], [address] [place], [place] [address]    Gram Stain   Final    RARE WBC PRESENT, PREDOMINANTLY MONONUCLEAR NO ORGANISMS SEEN    Culture   Final    RARE CANDIDA ALBICANS CRITICAL RESULT CALLED TO, READ BACK BY AND VERIFIED WITH: RN E.MORALIS AT 0824 ON 04/12/20 BY DV Performed at [facility], [address] [place], [place] [address]    Report Status 04/14/2020 FINAL  Final  SARS Coronavirus 2 by RT PCR (hospital order, performed in [facility] hospital lab) Nasopharyngeal Nasopharyngeal Swab     Status: None   Collection Time: 04/19/20  7:05 PM   Specimen: Nasopharyngeal Swab  Result Value Ref Range Status   SARS Coronavirus 2 NEGATIVE NEGATIVE Final    Comment: (NOTE) SARS-CoV-2 target nucleic acids are NOT DETECTED.  The SARS-CoV-2 RNA is generally detectable in upper and lower respiratory  specimens during the acute phase of infection. The lowest concentration of SARS-CoV-2 viral copies this assay can detect is 250 copies / mL. A negative result does not preclude SARS-CoV-2 infection and should not be used as the sole basis for treatment or other patient management decisions.  A negative result may occur with improper specimen collection / handling, submission of specimen other than nasopharyngeal swab, presence of viral mutation(s) within the areas targeted by this assay, and inadequate number of viral copies (<250 copies / mL). A negative result must be combined with clinical observations, patient history, and epidemiological information.  Fact Sheet for Patients:   BoilerBrush.com.cy  Fact Sheet for Healthcare Providers: https://pope.com/  This test is not yet approved or  cleared by the [place] FDA and has been authorized for detection and/or diagnosis of SARS-CoV-2 by FDA under an Emergency Use Authorization (EUA).  This EUA will remain in effect (meaning this test can be used) for the duration of the COVID-19 declaration under Section 564(b)(1) of the Act, 21 U.S.C. section 360bbb-3(b)(1), unless the authorization is terminated or revoked sooner.  Performed at [facility], [address] [place], [place] [address]     Procedures and diagnostic studies:  CT ABDOMEN PELVIS WO CONTRAST  Result Date: 04/19/2020 CLINICAL DATA:  Abdominal pain and fever. EXAM: CT ABDOMEN AND  PELVIS WITHOUT CONTRAST TECHNIQUE: Multidetector CT imaging of the abdomen and pelvis was performed following the standard protocol without IV contrast. COMPARISON:  December 22, 2019 FINDINGS: Lower chest: Mild left basilar and moderate severity right basilar atelectasis and/or infiltrate. There is a small right pleural effusion. Hepatobiliary: No focal liver abnormality is seen. No gallstones, gallbladder wall thickening, or biliary  dilatation. Pancreas: Unremarkable. No pancreatic ductal dilatation or surrounding inflammatory changes. Spleen: Normal in size without focal abnormality. Adrenals/Urinary Tract: Adrenal glands are unremarkable. Kidneys are normal, without renal calculi, focal lesion, or hydronephrosis. Bladder is unremarkable. Stomach/Bowel: Stomach is within normal limits. The appendix is not clearly identified. No evidence of bowel dilatation. Mild to moderate severity thickening of the transverse colon is seen. Noninflamed diverticula are seen throughout the large bowel. Vascular/Lymphatic: There is mild calcification of the abdominal aorta and bilateral common iliac arteries, without evidence of aneurysmal dilatation. No enlarged abdominal or pelvic lymph nodes. Reproductive: The uterus is normal in size and appearance. A 2.0 cm diameter left adnexal cyst is noted. Other: No abdominal wall hernia or abnormality. A moderate amount of abdominal and pelvic free fluid is seen. Musculoskeletal: Moderate to marked severity edema is seen within the lateral and posterolateral aspects of the bilateral abdominal and pelvic wall. Moderate to marked severity degenerative changes seen at the levels of L4-L5 and L5-S1. IMPRESSION: 1. Mild left basilar and moderate severity right basilar atelectasis and/or infiltrate. 2. Small right pleural effusion. 3. Mild to moderate severity thickening of the transverse colon which may represent an infectious or inflammatory colitis. 4. Noninflamed diverticula throughout the large bowel. 5. 2.0 cm diameter left adnexal cyst, likely ovarian in origin. 6. Moderate to marked severity edema within the lateral and posterolateral aspects of the bilateral abdominal and pelvic wall. 7. Aortic atherosclerosis. Aortic Atherosclerosis (ICD10-I70.0). Electronically Signed   By: Aram Candela M.D.   On: 04/19/2020 18:56   CT Head Wo Contrast  Result Date: 04/19/2020 CLINICAL DATA:  Mental status change. EXAM:  CT HEAD WITHOUT CONTRAST TECHNIQUE: Contiguous axial images were obtained from the base of the skull through the vertex without intravenous contrast. COMPARISON:  CT head 04/06/2020 FINDINGS: Brain: Mild atrophy without hydrocephalus. Negative for acute infarct, hemorrhage, mass Vascular: Negative for hyperdense vessel Skull: Negative Sinuses/Orbits: Paranasal sinuses clear.  Negative orbit Other: None IMPRESSION: No acute abnormality and  no interval change.  Generalized atrophy. Electronically Signed   By: Marlan Palau M.D.   On: 04/19/2020 12:50   US Paracentesis  Result Date: 04/20/2020 INDICATION: Concern for spontaneous bacterial peritonitis EXAM: ULTRASOUND GUIDED DIAGNOSTIC AND THERAPEUTIC PARACENTESIS MEDICATIONS: 1% LIDOCAINE LOCAL COMPLICATIONS: None immediate. PROCEDURE: Informed written consent was obtained from the patient after a discussion of the risks, benefits and alternatives to treatment. A timeout was performed prior to the initiation of the procedure. Initial ultrasound scanning demonstrates a large amount of ascites within the right lower abdominal quadrant. The right lower abdomen was prepped and draped in the usual sterile fashion. 1% lidocaine was used for local anesthesia. Following this, a 6 Fr Safe-T-Centesis catheter was introduced. An ultrasound image was saved for documentation purposes. The paracentesis was performed. The catheter was removed and a dressing was applied. The patient tolerated the procedure well without immediate post procedural complication. FINDINGS: A total of approximately 1.3 L of clear yellow peritoneal fluid was removed. Samples were sent to the laboratory as requested by the clinical team. IMPRESSION: Successful ultrasound-guided paracentesis yielding 1.3 liters of peritoneal fluid. Electronically Signed   By:  M.  Shick M.D.   On: 04/20/2020 11:11   DG Chest Portable 1 View  Result Date: 04/19/2020 CLINICAL DATA:  Shortness of breath. Confusion.  Follow-up pneumonia. EXAM: PORTABLE CHEST 1 VIEW COMPARISON:  04/09/2020 FINDINGS: Considerable radiographic improvement. Mild hazy infiltrate persists in the mid and lower lungs. No worsening or new finding. No effusion. Upper lungs remain clear. IMPRESSION: Radiographic improvement. Only mild residual hazy infiltrate persists at the mid and lower lungs. Electronically Signed   By: Paulina Fusi M.D.   On: 04/19/2020 17:47               LOS: 1 day   Jill Reyes  [facility]   Pager on [email or web address]. If 7PM-7AM, please contact night-coverage at [email or web address]     04/20/2020, 12:12 PM

## 2020-04-20 NOTE — Plan of Care (Signed)
  Problem: Education: Goal: Knowledge of General Education information will improve Description Including pain rating scale, medication(s)/side effects and non-pharmacologic comfort measures Outcome: Progressing   

## 2020-04-21 DIAGNOSIS — K652 Spontaneous bacterial peritonitis: Secondary | ICD-10-CM

## 2020-04-21 DIAGNOSIS — K7011 Alcoholic hepatitis with ascites: Secondary | ICD-10-CM

## 2020-04-21 LAB — COMPREHENSIVE METABOLIC PANEL
ALT: 38 U/L (ref 0–44)
AST: 59 U/L — ABNORMAL HIGH (ref 15–41)
Albumin: 2.2 g/dL — ABNORMAL LOW (ref 3.5–5.0)
Alkaline Phosphatase: 164 U/L — ABNORMAL HIGH (ref 38–126)
Anion gap: 11 (ref 5–15)
BUN: 23 mg/dL — ABNORMAL HIGH (ref 6–20)
CO2: 23 mmol/L (ref 22–32)
Calcium: 7.5 mg/dL — ABNORMAL LOW (ref 8.9–10.3)
Chloride: 107 mmol/L (ref 98–111)
Creatinine, Ser: 0.84 mg/dL (ref 0.44–1.00)
GFR calc Af Amer: >60 mL/min (ref >60)
GFR calc non Af Amer: >60 mL/min (ref >60)
Glucose, Bld: 87 mg/dL (ref 70–99)
Potassium: 3.1 mmol/L — ABNORMAL LOW (ref 3.5–5.1)
Sodium: 141 mmol/L (ref 135–145)
Total Bilirubin: 6.5 mg/dL — ABNORMAL HIGH (ref 0.3–1.2)
Total Protein: 4.7 g/dL — ABNORMAL LOW (ref 6.5–8.1)

## 2020-04-21 LAB — CBC WITH DIFFERENTIAL/PLATELET
Abs Immature Granulocytes: 0.05 10*3/uL (ref 0.00–0.07)
Basophils Absolute: 0 10*3/uL (ref 0.0–0.1)
Basophils Relative: 0 %
Eosinophils Absolute: 0.3 10*3/uL (ref 0.0–0.5)
Eosinophils Relative: 2 %
HCT: 24.6 % — ABNORMAL LOW (ref 36.0–46.0)
Hemoglobin: 8.2 g/dL — ABNORMAL LOW (ref 12.0–15.0)
Immature Granulocytes: 0 %
Lymphocytes Relative: 17 %
Lymphs Abs: 2 10*3/uL (ref 0.7–4.0)
MCH: 34.9 pg — ABNORMAL HIGH (ref 26.0–34.0)
MCHC: 33.3 g/dL (ref 30.0–36.0)
MCV: 104.7 fL — ABNORMAL HIGH (ref 80.0–100.0)
Monocytes Absolute: 0.7 10*3/uL (ref 0.1–1.0)
Monocytes Relative: 6 %
Neutro Abs: 9.1 10*3/uL — ABNORMAL HIGH (ref 1.7–7.7)
Neutrophils Relative %: 75 %
Platelets: 112 10*3/uL — ABNORMAL LOW (ref 150–400)
RBC: 2.35 MIL/uL — ABNORMAL LOW (ref 3.87–5.11)
RDW: 18.8 % — ABNORMAL HIGH (ref 11.5–15.5)
WBC: 12.2 10*3/uL — ABNORMAL HIGH (ref 4.0–10.5)
nRBC: 0 % (ref 0.0–0.2)

## 2020-04-21 LAB — PHOSPHORUS: Phosphorus: 2.7 mg/dL (ref 2.5–4.6)

## 2020-04-21 LAB — MAGNESIUM: Magnesium: 1.9 mg/dL (ref 1.7–2.4)

## 2020-04-21 MED ORDER — TRAMADOL HCL 50 MG PO TABS
50.0000 mg | ORAL_TABLET | Freq: Four times a day (QID) | ORAL | Status: DC | PRN
  Administered 2020-04-21 – 2020-04-23 (×5): 50 mg via ORAL
  Filled 2020-04-21 (×5): qty 1

## 2020-04-21 MED ORDER — PREDNISONE 20 MG PO TABS
30.0000 mg | ORAL_TABLET | Freq: Every day | ORAL | Status: AC
  Administered 2020-04-21: 30 mg via ORAL
  Filled 2020-04-21: qty 1

## 2020-04-21 MED ORDER — PREDNISONE 10 MG PO TABS: 10.0000 mg | ORAL_TABLET | Freq: Every day | ORAL | Status: DC

## 2020-04-21 MED ORDER — PREDNISONE 20 MG PO TABS
20.0000 mg | ORAL_TABLET | Freq: Every day | ORAL | Status: DC
  Administered 2020-04-22 – 2020-04-23 (×2): 20 mg via ORAL
  Filled 2020-04-21 (×2): qty 1

## 2020-04-21 MED ORDER — POTASSIUM CHLORIDE CRYS ER 20 MEQ PO TBCR
40.0000 meq | EXTENDED_RELEASE_TABLET | ORAL | Status: AC
  Administered 2020-04-21 (×2): 40 meq via ORAL
  Filled 2020-04-21 (×2): qty 2

## 2020-04-21 NOTE — Evaluation (Signed)
Occupational Therapy Evaluation Patient Details Name: Jill Reyes MRN: [id number] DOB: [birth year] Today's Date: 04/21/2020    History of Present Illness Per MD note: Jill Reyes  is a 44 y.o. Caucasian female with a known history of alcoholic liver cirrhosis, portal hypertension with esophageal varices, who presented to the emergency room with acute onset of diffuse abdominal pain that started around the sites where she had paracentesis. Abdominal pelvic CT scan revealed the following: Mild left basilar and moderate severity right basilar atelectasisand/or infiltrate, Small right pleural effusion, Mild to moderate severity thickening of the transverse colon which may represent an infectious or inflammatory colitis. Adm with primary dx of: SBP (spontaneous bacterial peritonitis). Pt is s/p paracentesis with removal of 1.3 L of yellowish peritoneal fluid on 04/20/2020   Clinical Impression   Pt was seen for OT evaluation this date. Prior to last hospital admission, pt was Indep with self care ADLs/ADL mobility. Pt endorses increased difficulty since last admission and requiring increased help from family for BADLs including bathing, dressing and toileting. Pt lived in 2 story home with spouse, but now spouse and pt are staying with pt's mother in a [facility] that is more accessible. Currently pt demonstrates impairments as described below (See OT problem list) which functionally limit her ability to perform ADL/self-care tasks. Pt currently requires MIN A/CGA with bed mobility, demos G static sitting balance, but c/o too much pain to attempt standing at this time. Pt requires MIN A with LB ADLs d/t abd discomfort.  Pt would benefit from skilled OT to address noted impairments and functional limitations (see below for any additional details) in order to maximize safety and independence while minimizing falls risk and caregiver burden. Upon hospital discharge, recommend HHOT to maximize pt safety and return to  functional independence during meaningful occupations of daily life.     Follow Up Recommendations  Home health OT;Supervision/Assistance - 24 hour    Equipment Recommendations  3 in 1 bedside commode;Tub/shower seat    Recommendations for Other Services       Precautions / Restrictions Precautions Precautions: Fall Restrictions Weight Bearing Restrictions: No      Mobility Bed Mobility Overal bed mobility: Needs Assistance Bed Mobility: Supine to Sit;Sit to Supine     Supine to sit: Min guard;Min assist Sit to supine: Min guard;Min assist      Transfers                 General transfer comment: pt declines to participate in t/f upon OT assessment    Balance Overall balance assessment: Needs assistance Sitting-balance support: No upper extremity supported;Feet supported Sitting balance-Leahy Scale: Good         Standing balance comment: NT                           ADL either performed or assessed with clinical judgement   ADL Overall ADL's : Needs assistance/impaired Eating/Feeding: Set up;Supervision/ safety;Sitting;Bed level   Grooming: Sitting;Set up;Supervision/safety           Upper Body Dressing : Minimal assistance;Sitting   Lower Body Dressing: Minimal assistance;Bed level Lower Body Dressing Details (indicate cue type and reason): to don socks with HOB elevated, using cross-leg technique   Toilet Transfer Details (indicate cue type and reason): NT, pt declines to get OOB with OT at this time.                 Vision Baseline Vision/History: Wears glasses  Wears Glasses: At all times Patient Visual Report: No change from baseline       Perception     Praxis      Pertinent Vitals/Pain Pain Assessment: 0-10 Pain Score: 7  Pain Location: stomach pain Pain Descriptors / Indicators: Aching;Discomfort Pain Intervention(s): Limited activity within patient's tolerance;Monitored during session     Hand Dominance  Right   Extremity/Trunk Assessment Upper Extremity Assessment Upper Extremity Assessment: Overall WFL for tasks assessed;Generalized weakness (ROM WFL, MMT grossly 4-/5)   Lower Extremity Assessment Lower Extremity Assessment: Overall WFL for tasks assessed;Generalized weakness       Communication Communication Communication: No difficulties   Cognition Arousal/Alertness: Awake/alert Behavior During Therapy: WFL for tasks assessed/performed Overall Cognitive Status: Within Functional Limits for tasks assessed                                     General Comments       Exercises Other Exercises Other Exercises: OT facilitates education re: importance of OOB activity including preventing skin breakdown and PNA as well as prevention of atrophy   Shoulder Instructions      Home Living Family/patient expects to be discharged to:: Private residence Living Arrangements: Parent (spouse and pt live at her mom's house currently, was previously at her own home which was two stories) Available Help at Discharge: Family;Available 24 hours/day Type of Home: House Home Access: Ramped entrance   Entrance Stairs-Rails: None Home Layout: One level     Bathroom Shower/Tub: Tub/shower unit;Door   Bathroom Toilet: Standard     Home Equipment: Environmental consultant - 2 wheels   Additional Comments: 2WW donated to pt after last stay      Prior Functioning/Environment Level of Independence: Independent        Comments: Indep at baseline, but endorses difficulty after last hospitalization with "everything", reports mom/husband was helping with getting to restroom/dressed/bathed. States she could use the walker with close supervision/hands on assist.        OT Problem List: Decreased strength;Decreased coordination;Pain;Decreased activity tolerance;Increased edema;Impaired balance (sitting and/or standing);Decreased knowledge of use of DME or AE;Decreased safety awareness      OT  Treatment/Interventions: Self-care/ADL training;Therapeutic exercise;Therapeutic activities;DME and/or AE instruction;Patient/family education;Balance training;Energy conservation    OT Goals(Current goals can be found in the care plan section) Acute Rehab OT Goals Patient Stated Goal: To have less pain and get stronger OT Goal Formulation: With patient Time For Goal Achievement: 05/05/20 Potential to Achieve Goals: Good ADL Goals Pt Will Perform Lower Body Dressing: with modified independence;sit to/from stand Pt Will Transfer to Toilet: with modified independence;ambulating (with LRAD to BSC PRN versus restroom) Pt Will Perform Toileting - Clothing Manipulation and hygiene: with modified independence;sit to/from stand Pt/caregiver will Perform Home Exercise Program: Increased strength;Both right and left upper extremity;With Supervision  OT Frequency: Min 1X/week   Barriers to D/C:            Co-evaluation              AM-PAC OT "6 Clicks" Daily Activity     Outcome Measure Help from another person eating meals?: None Help from another person taking care of personal grooming?: A Little Help from another person toileting, which includes using toliet, bedpan, or urinal?: A Little Help from another person bathing (including washing, rinsing, drying)?: A Little Help from another person to put on and taking off regular upper body clothing?: A Little  Help from another person to put on and taking off regular lower body clothing?: A Lot 6 Click Score: 18   End of Session    Activity Tolerance: Patient limited by pain;Patient tolerated treatment well Patient left: in bed;with call bell/phone within reach;with bed alarm set  OT Visit Diagnosis: Muscle weakness (generalized) (M62.81);Pain Pain - part of body:  (stomach/abd area)                Time: 0737-1062 OT Time Calculation (min): 39 min Charges:  OT General Charges $OT Visit: 1 Visit OT Evaluation $OT Eval Moderate  Complexity: 1 Mod OT Treatments $Self Care/Home Management : 8-22 mins $Therapeutic Activity: 8-22 mins  Rejeana Brock, MS, OTR/L ascom [phone] 04/21/20, 6:47 PM

## 2020-04-21 NOTE — Progress Notes (Signed)
Pt request morphine to be discontinued and wants to be prescribed Tramadol. Writer notified NP of request.

## 2020-04-21 NOTE — Progress Notes (Addendum)
Progress Note    Jill Reyes  YYQ:[id number] DOB: 03-20-1976  DOA: 04/19/2020 PCP: Patient, No Pcp Per      Brief Narrative:    Medical records reviewed and are as summarized below:  Jill Reyes is a 44 y.o. female       Assessment/Plan:   Principal Problem:   SBP (spontaneous bacterial peritonitis) (HCC) Active Problems:   Esophageal varices in alcoholic cirrhosis (HCC)   Suspected spontaneous bacterial peritonitis, ascites Alcoholic liver cirrhosis with coagulopathy, thrombocytopenia and anasarca Alcoholic liver hepatitis Portal hypertension and grade 1 esophageal varices Sinus tachycardia (patient had persistent sinus tachycardia on her last admission) Persistent leukocytosis Hepatic encephalopathy-mental status has improved Hypokalemia Alcohol use disorder Tobacco use disorder No evidence of sepsis.  Sepsis ruled out  PLAN  S/p paracentesis with removal of 1.3 L of yellowish peritoneal fluid on 04/20/2020 Ascitic fluid total nucleated cell count was 267 neutrophil count could not be differentiated.  Ascitic fluid culture is pending. Replete potassium and monitor levels.   Continue empiric IV antibiotics. Continue IV Lasix and oral Aldactone Continue lactulose and rifaximin Consulted gastroenterologist to assist with management. Ordered PT and OT for generalized weakness Resume prednisone taper per her gastroenterologist (patient was recently discharged home prednisone taper to be completed on 04/29/2020)   Body mass index is 22.5 kg/m.  Diet Order            Diet 2 gram sodium Room service appropriate? Yes; Fluid consistency: Thin  Diet effective now                        Medications:   . enoxaparin (LOVENOX) injection  40 mg Subcutaneous Q24H  . fluconazole  400 mg Oral q1800  . folic acid  1 mg Oral Daily  . furosemide  40 mg Intravenous BID  . lactulose  20 g Oral BID  . loratadine  10 mg Oral Daily  . melatonin  5 mg Oral  QHS  . multivitamin with minerals  1 tablet Oral Daily  . nicotine  21 mg Transdermal Daily  . pantoprazole  40 mg Oral Daily  . polyethylene glycol  17 g Oral Daily  . rifaximin  550 mg Oral BID  . spironolactone  50 mg Oral QPC breakfast  . thiamine  100 mg Oral Daily   Continuous Infusions: . cefTRIAXone (ROCEPHIN)  IV 2 g (04/21/20 0853)  . metronidazole 500 mg (04/21/20 1334)     Anti-infectives (From admission, onward)   Start     Dose/Rate Route Frequency Ordered Stop   04/21/20 1000  cefTRIAXone (ROCEPHIN) 2 g in sodium chloride 0.9 % 100 mL IVPB        2 g 200 mL/hr over 30 Minutes Intravenous Every 24 hours 04/20/20 1725     04/20/20 1000  cefTRIAXone (ROCEPHIN) 2 g in sodium chloride 0.9 % 100 mL IVPB  Status:  Discontinued        2 g 200 mL/hr over 30 Minutes Intravenous Every 12 hours 04/19/20 1947 04/20/20 1725   04/20/20 0600  metroNIDAZOLE (FLAGYL) IVPB 500 mg        500 mg 100 mL/hr over 60 Minutes Intravenous Every 8 hours 04/19/20 1947     04/19/20 2200  rifaximin (XIFAXAN) tablet 550 mg        550 mg Oral 2 times daily 04/19/20 1947     04/19/20 2100  fluconazole (DIFLUCAN) tablet 400 mg  400 mg Oral Daily-1800 04/19/20 1947     04/19/20 1915  metroNIDAZOLE (FLAGYL) IVPB 500 mg        500 mg 100 mL/hr over 60 Minutes Intravenous  Once 04/19/20 1904 04/19/20 2257   04/19/20 1815  cefTRIAXone (ROCEPHIN) 2 g in sodium chloride 0.9 % 100 mL IVPB        2 g 200 mL/hr over 30 Minutes Intravenous  Once 04/19/20 1807 04/19/20 2107             Family Communication/Anticipated D/C date and plan/Code Status   DVT prophylaxis: Place and maintain sequential compression device Start: 04/20/20 0652 enoxaparin (LOVENOX) injection 40 mg Start: 04/19/20 2200     Code Status: Full Code  Family Communication: Plan discussed with patient Disposition Plan:    Status is: Inpatient  Remains inpatient appropriate because:IV treatments appropriate due to  intensity of illness or inability to take PO and Inpatient level of care appropriate due to severity of illness   Dispo: The patient is from: Home              Anticipated d/c is to: Home              Anticipated d/c date is: 3 days              Patient currently is not medically stable to d/c.           Subjective:   She still having abdominal pain but is better than yesterday.  She complains of diarrhea.  Objective:    Vitals:   04/21/20 0003 04/21/20 0431 04/21/20 0812 04/21/20 1105  BP: 112/79 111/78 100/68 103/74  Pulse: (!) 117 (!) 108 99 (!) 104  Resp: 19 19 16 16   Temp: 98.1 F (36.7 C) 98.2 F (36.8 C) 98.2 F (36.8 C) 98.4 F (36.9 C)  TempSrc: Oral Oral Oral Oral  SpO2: 99% 98% 100% 100%  Weight:      Height:       No data found.   Intake/Output Summary (Last 24 hours) at 04/21/2020 1403 Last data filed at 04/21/2020 1030 Gross per 24 hour  Intake 240 ml  Output 2000 ml  Net -1760 ml   Filed Weights   04/19/20 1202 04/19/20 2230  Weight: 61.1 kg 67.1 kg    Exam:  GEN: No acute distress SKIN: Bruises on b/l upper extremities. Spider nevi on chest. Jaundice EYES: Icteric. ENT: MMM CV: Regular rate but tachycardic PULM: No wheezing or rales heard ABD: Soft, nondistended.  Tenderness over the right lower quadrant puncture site for paracentesis.  No rebound tenderness or guarding.  Bowel sounds present. CNS: Alert and oriented x3. EXT: Lateral leg edema (2+).  No tenderness    Data Reviewed:   I have personally reviewed following labs and imaging studies:  Labs: Labs show the following:   Basic Metabolic Panel: Recent Labs  Lab 04/15/20 1143 04/15/20 1143 04/19/20 1214 04/19/20 1214 04/20/20 0419 04/21/20 0420  NA 135  --  142  --  144 141  K 4.3   < > 4.2   < > 3.4* 3.1*  CL 101  --  105  --  107 107  CO2 22  --  26  --  25 23  GLUCOSE 125*  --  114*  --  77 87  BUN 31*  --  32*  --  28* 23*  CREATININE 0.93  --  1.09*  --   0.90 0.84  CALCIUM 8.6*  --  8.9  --  8.4* 7.5*  MG  --   --   --   --  2.2 1.9  PHOS  --   --   --   --  3.3 2.7   < > = values in this interval not displayed.   GFR Estimated Creatinine Clearance: 88 mL/min (by C-G formula based on SCr of 0.84 mg/dL). Liver Function Tests: Recent Labs  Lab 04/15/20 1143 04/19/20 1214 04/21/20 0420  AST 126* 98* 59*  ALT 68* 64* 38  ALKPHOS 192* 243* 164*  BILITOT 9.8* 8.7* 6.5*  PROT 5.7* 6.3* 4.7*  ALBUMIN 2.9* 3.1* 2.2*   Recent Labs  Lab 04/19/20 1214  LIPASE 86*   Recent Labs  Lab 04/19/20 1214  AMMONIA 33   Coagulation profile Recent Labs  Lab 04/19/20 1214 04/20/20 0419  INR 1.4* 1.5*    CBC: Recent Labs  Lab 04/15/20 1143 04/19/20 1214 04/20/20 0419 04/21/20 0420  WBC 23.7* 17.5* 14.3* 12.2*  NEUTROABS 21.5* 14.2*  --  9.1*  HGB 8.4* 10.0* 9.2* 8.2*  HCT 25.8* 30.4* 27.5* 24.6*  MCV 107.1* 105.9* 107.0* 104.7*  PLT 181 171 126* 112*   Cardiac Enzymes: No results for input(s): CKTOTAL, CKMB, CKMBINDEX, TROPONINI in the last 168 hours. BNP (last 3 results) No results for input(s): PROBNP in the last 8760 hours. CBG: No results for input(s): GLUCAP in the last 168 hours. D-Dimer: No results for input(s): DDIMER in the last 72 hours. Hgb A1c: No results for input(s): HGBA1C in the last 72 hours. Lipid Profile: No results for input(s): CHOL, HDL, LDLCALC, TRIG, CHOLHDL, LDLDIRECT in the last 72 hours. Thyroid function studies: No results for input(s): TSH, T4TOTAL, T3FREE, THYROIDAB in the last 72 hours.  Invalid input(s): FREET3 Anemia work up: No results for input(s): VITAMINB12, FOLATE, FERRITIN, TIBC, IRON, RETICCTPCT in the last 72 hours. Sepsis Labs: Recent Labs  Lab 04/15/20 1143 04/19/20 1214 04/20/20 0419 04/21/20 0420  PROCALCITON  --   --  0.38  --   WBC 23.7* 17.5* 14.3* 12.2*    Microbiology Recent Results (from the past 240 hour(s))  SARS Coronavirus 2 by RT PCR (hospital order,  performed in [facility] hospital lab) Nasopharyngeal Nasopharyngeal Swab     Status: None   Collection Time: 04/19/20  7:05 PM   Specimen: Nasopharyngeal Swab  Result Value Ref Range Status   SARS Coronavirus 2 NEGATIVE NEGATIVE Final    Comment: (NOTE) SARS-CoV-2 target nucleic acids are NOT DETECTED.  The SARS-CoV-2 RNA is generally detectable in upper and lower respiratory specimens during the acute phase of infection. The lowest concentration of SARS-CoV-2 viral copies this assay can detect is 250 copies / mL. A negative result does not preclude SARS-CoV-2 infection and should not be used as the sole basis for treatment or other patient management decisions.  A negative result may occur with improper specimen collection / handling, submission of specimen other than nasopharyngeal swab, presence of viral mutation(s) within the areas targeted by this assay, and inadequate number of viral copies (<250 copies / mL). A negative result must be combined with clinical observations, patient history, and epidemiological information.  Fact Sheet for Patients:   BoilerBrush.com.cy  Fact Sheet for Healthcare Providers: https://pope.com/  This test is not yet approved or  cleared by the [place] FDA and has been authorized for detection and/or diagnosis of SARS-CoV-2 by FDA under an Emergency Use Authorization (EUA).  This EUA will remain in effect (meaning this test can  be used) for the duration of the COVID-19 declaration under Section 564(b)(1) of the Act, 21 U.S.C. section 360bbb-3(b)(1), unless the authorization is terminated or revoked sooner.  Performed at [facility], [address] [place], [place] [address]   Body fluid culture     Status: None (Preliminary result)   Collection Time: 04/20/20 10:30 AM   Specimen: Peritoneal Washings  Result Value Ref Range Status   Specimen Description PERITONEAL FLUID  Final    Special Requests NONE  Final   Gram Stain NO WBC SEEN NO ORGANISMS SEEN CYTOSPIN SMEAR   Final   Culture   Final    NO GROWTH < 12 HOURS Performed at [facility], [address], [place], [place] [address]    Report Status PENDING  Incomplete    Procedures and diagnostic studies:  CT ABDOMEN PELVIS WO CONTRAST  Result Date: 04/19/2020 CLINICAL DATA:  Abdominal pain and fever. EXAM: CT ABDOMEN AND PELVIS WITHOUT CONTRAST TECHNIQUE: Multidetector CT imaging of the abdomen and pelvis was performed following the standard protocol without IV contrast. COMPARISON:  December 22, 2019 FINDINGS: Lower chest: Mild left basilar and moderate severity right basilar atelectasis and/or infiltrate. There is a small right pleural effusion. Hepatobiliary: No focal liver abnormality is seen. No gallstones, gallbladder wall thickening, or biliary dilatation. Pancreas: Unremarkable. No pancreatic ductal dilatation or surrounding inflammatory changes. Spleen: Normal in size without focal abnormality. Adrenals/Urinary Tract: Adrenal glands are unremarkable. Kidneys are normal, without renal calculi, focal lesion, or hydronephrosis. Bladder is unremarkable. Stomach/Bowel: Stomach is within normal limits. The appendix is not clearly identified. No evidence of bowel dilatation. Mild to moderate severity thickening of the transverse colon is seen. Noninflamed diverticula are seen throughout the large bowel. Vascular/Lymphatic: There is mild calcification of the abdominal aorta and bilateral common iliac arteries, without evidence of aneurysmal dilatation. No enlarged abdominal or pelvic lymph nodes. Reproductive: The uterus is normal in size and appearance. A 2.0 cm diameter left adnexal cyst is noted. Other: No abdominal wall hernia or abnormality. A moderate amount of abdominal and pelvic free fluid is seen. Musculoskeletal: Moderate to marked severity edema is seen within the lateral and posterolateral aspects of the  bilateral abdominal and pelvic wall. Moderate to marked severity degenerative changes seen at the levels of L4-L5 and L5-S1. IMPRESSION: 1. Mild left basilar and moderate severity right basilar atelectasis and/or infiltrate. 2. Small right pleural effusion. 3. Mild to moderate severity thickening of the transverse colon which may represent an infectious or inflammatory colitis. 4. Noninflamed diverticula throughout the large bowel. 5. 2.0 cm diameter left adnexal cyst, likely ovarian in origin. 6. Moderate to marked severity edema within the lateral and posterolateral aspects of the bilateral abdominal and pelvic wall. 7. Aortic atherosclerosis. Aortic Atherosclerosis (ICD10-I70.0). Electronically Signed   By: Aram Candela M.D.   On: 04/19/2020 18:56   US Paracentesis  Result Date: 04/20/2020 INDICATION: Concern for spontaneous bacterial peritonitis EXAM: ULTRASOUND GUIDED DIAGNOSTIC AND THERAPEUTIC PARACENTESIS MEDICATIONS: 1% LIDOCAINE LOCAL COMPLICATIONS: None immediate. PROCEDURE: Informed written consent was obtained from the patient after a discussion of the risks, benefits and alternatives to treatment. A timeout was performed prior to the initiation of the procedure. Initial ultrasound scanning demonstrates a large amount of ascites within the right lower abdominal quadrant. The right lower abdomen was prepped and draped in the usual sterile fashion. 1% lidocaine was used for local anesthesia. Following this, a 6 Fr Safe-T-Centesis catheter was introduced. An ultrasound image was saved for documentation  purposes. The paracentesis was performed. The catheter was removed and a dressing was applied. The patient tolerated the procedure well without immediate post procedural complication. FINDINGS: A total of approximately 1.3 L of clear yellow peritoneal fluid was removed. Samples were sent to the laboratory as requested by the clinical team. IMPRESSION: Successful ultrasound-guided paracentesis  yielding 1.3 liters of peritoneal fluid. Electronically Signed   By: Judie PetitM.  Shick M.D.   On: 04/20/2020 11:11   DG Chest Portable 1 View  Result Date: 04/19/2020 CLINICAL DATA:  Shortness of breath. Confusion. Follow-up pneumonia. EXAM: PORTABLE CHEST 1 VIEW COMPARISON:  04/09/2020 FINDINGS: Considerable radiographic improvement. Mild hazy infiltrate persists in the mid and lower lungs. No worsening or new finding. No effusion. Upper lungs remain clear. IMPRESSION: Radiographic improvement. Only mild residual hazy infiltrate persists at the mid and lower lungs. Electronically Signed   By: Paulina FusiMark  Shogry M.D.   On: 04/19/2020 17:47               LOS: 2 days   Truong Delcastillo  [facility]   Pager on [email or web address]. If 7PM-7AM, please contact night-coverage at [email or web address]     04/21/2020, 2:03 PM

## 2020-04-21 NOTE — Consult Note (Signed)
Jill Bouillon, MD [address], [place], [place], [address] [address], [place], [place], [address] Phone: [phone]  Fax: [phone]  Consultation  Referring Provider:     Dr. Myriam Forehand Primary Care Physician:  Patient, No Pcp Per Reason for Consultation:     SBP  Date of Admission:  04/19/2020 Date of Consultation:  04/21/2020         HPI:   Jill Reyes is a 44 y.o. female with history of alcohol abuse, recently discharged on 04/15/2020.  Patient at that time presented with jaundice and treated for alcoholic hepatitis, AKI, hyponatremia, aspiration pneumonia.  Patient also underwent EGD during that admission that showed grade 1 esophageal varices and portal hypertensive gastropathy.  EGD was not done for active GI bleeding, but due to ascitic fluid showing rare yeast.  EGD did not show any source of infection.  Patient was treated with antifungal by ID.  Patient now presents with abdominal pain.  She describes the pain as diffuse.  States the pain improved after paracentesis.  States she had similar pain prior to her paracentesis on last admission.  Patient is being treated for presumed SBP.  Patient underwent paracentesis with 1.3 L of fluid removed  Past medical history: Cirrhosis  Past Surgical History:  Procedure Laterality Date  . CARPAL TUNNEL RELEASE    . CESAREAN SECTION    . ESOPHAGOGASTRODUODENOSCOPY (EGD) WITH PROPOFOL N/A 04/13/2020   Procedure: ESOPHAGOGASTRODUODENOSCOPY (EGD) WITH PROPOFOL;  Surgeon: Midge Minium, MD;  Location[facility] ENDOSCOPY;  Service: Endoscopy;  Laterality: N/A;  . HEMORRHOID SURGERY N/A 12/25/2019   Procedure: HEMORRHOIDECTOMY;  Surgeon: Sung Amabile, DO;  Location: [facility] ORS;  Service: General;  Laterality: N/A;  . INCISION AND DRAINAGE PERIRECTAL ABSCESS N/A 12/25/2019   Procedure: IRRIGATION AND DEBRIDEMENT PERIRECTAL ABSCESS;  Surgeon: Sung Amabile, DO;  Location: [facility] ORS;  Service: General;  Laterality: N/A;  .  TUBAL LIGATION      Prior to Admission medications   Medication Sig Start Date End Date Taking? Authorizing Provider  fluconazole (DIFLUCAN) 200 MG tablet Take 2 tablets (400 mg total) by mouth daily for 7 days. 04/15/20 04/22/20  Swayze, Ava, DO  folic acid (FOLVITE) 1 MG tablet Take 1 tablet (1 mg total) by mouth daily. 04/15/20   Swayze, Ava, DO  furosemide (LASIX) 20 MG tablet Take 1 tablet (20 mg total) by mouth daily after breakfast. 04/15/20   Swayze, Ava, DO  loratadine (CLARITIN) 10 MG tablet Take 1 tablet (10 mg total) by mouth daily. 12/26/19   Pennie Banter, DO  megestrol (MEGACE) 40 MG tablet Take 1 tablet (40 mg total) by mouth daily. 04/15/20   Swayze, Ava, DO  melatonin 5 MG TABS Take 1 tablet (5 mg total) by mouth at bedtime. 04/14/20   Swayze, Ava, DO  Multiple Vitamin (MULTIVITAMIN WITH MINERALS) TABS tablet Take 1 tablet by mouth daily. 04/15/20   Swayze, Ava, DO  nicotine (NICODERM CQ - DOSED IN MG/24 HOURS) 21 mg/24hr patch Place 1 patch (21 mg total) onto the skin daily. 01/27/20   Dhungel, Nishant, MD  pantoprazole (PROTONIX) 40 MG tablet Take 1 tablet (40 mg total) by mouth daily. 04/14/20 04/14/21  Swayze, Ava, DO  polyethylene glycol (MIRALAX / GLYCOLAX) 17 g packet Take 17 g by mouth daily. 04/15/20   Swayze, Ava, DO  predniSONE (DELTASONE) 20 MG tablet Take 2 tablets (40 mg total) by mouth daily with breakfast for 3 days, THEN 1.5 tablets (30 mg total)  daily with breakfast for 4 days, THEN 1 tablet (20 mg total) daily with breakfast for 4 days, THEN 0.5 tablets (10 mg total) daily with breakfast for 4 days. 04/15/20 04/30/20  Swayze, Ava, DO  rifaximin (XIFAXAN) 550 MG TABS tablet Take 1 tablet (550 mg total) by mouth 2 (two) times daily. 04/14/20   Swayze, Ava, DO  spironolactone (ALDACTONE) 50 MG tablet Take 1 tablet (50 mg total) by mouth daily after breakfast. 04/15/20   Swayze, Ava, DO  thiamine 100 MG tablet Take 1 tablet (100 mg total) by mouth daily. 04/15/20   Swayze, Ava,  DO  traMADol (ULTRAM) 50 MG tablet Take 1 tablet (50 mg total) by mouth 3 (three) times daily as needed for moderate pain. 04/14/20   Swayze, Ava, DO    History reviewed. No pertinent family history.   Social History   Tobacco Use  . Smoking status: Former Smoker    Packs/day: 1.50    Types: Cigarettes  . Smokeless tobacco: Never Used  . Tobacco comment: last smoke 04/18/20  Vaping Use  . Vaping Use: Never used  Substance Use Topics  . Alcohol use: Yes    Comment: dailhy drinker, 2-3 beers a day, was a former liquor drinker, Last drink 04/02/20  . Drug use: Not Currently    Allergies as of 04/19/2020 - Review Complete 04/19/2020  Allergen Reaction Noted  . Aspartame and phenylalanine Shortness Of Breath 04/19/2020    Review of Systems:    All systems reviewed and negative except where noted in HPI.   Physical Exam:  Vital signs in last 24 hours: Vitals:   04/21/20 0003 04/21/20 0431 04/21/20 0812 04/21/20 1105  BP: 112/79 111/78 100/68 103/74  Pulse: (!) 117 (!) 108 99 (!) 104  Resp: 19 19 16 16   Temp: 98.1 F (36.7 C) 98.2 F (36.8 C) 98.2 F (36.8 C) 98.4 F (36.9 C)  TempSrc: Oral Oral Oral Oral  SpO2: 99% 98% 100% 100%  Weight:      Height:       Last BM Date: 04/17/20 General:   Pleasant, cooperative in NAD Head:  Normocephalic and atraumatic. Eyes:   No icterus.   Conjunctiva pink. PERRLA. Ears:  Normal auditory acuity. Neck:  Supple; no masses or thyroidomegaly Lungs: Respirations even and unlabored. Lungs clear to auscultation bilaterally.   No wheezes, crackles, or rhonchi.  Abdomen:  Soft, nondistended, nontender. Normal bowel sounds. No appreciable masses or hepatomegaly.  No rebound or guarding.  Neurologic:  Alert and oriented x3;  grossly normal neurologically. Skin:  Intact without significant lesions or rashes. Cervical Nodes:  No significant cervical adenopathy. Psych:  Alert and cooperative. Normal affect.  LAB RESULTS: Recent Labs     04/19/20 1214 04/20/20 0419 04/21/20 0420  WBC 17.5* 14.3* 12.2*  HGB 10.0* 9.2* 8.2*  HCT 30.4* 27.5* 24.6*  PLT 171 126* 112*   BMET Recent Labs    04/19/20 1214 04/20/20 0419 04/21/20 0420  NA 142 144 141  K 4.2 3.4* 3.1*  CL 105 107 107  CO2 26 25 23   GLUCOSE 114* 77 87  BUN 32* 28* 23*  CREATININE 1.09* 0.90 0.84  CALCIUM 8.9 8.4* 7.5*   LFT Recent Labs    04/21/20 0420  PROT 4.7*  ALBUMIN 2.2*  AST 59*  ALT 38  ALKPHOS 164*  BILITOT 6.5*   PT/INR Recent Labs    04/19/20 1214 04/20/20 0419  LABPROT 16.4* 17.3*  INR 1.4* 1.5*    STUDIES:  CT ABDOMEN PELVIS WO CONTRAST  Result Date: 04/19/2020 CLINICAL DATA:  Abdominal pain and fever. EXAM: CT ABDOMEN AND PELVIS WITHOUT CONTRAST TECHNIQUE: Multidetector CT imaging of the abdomen and pelvis was performed following the standard protocol without IV contrast. COMPARISON:  December 22, 2019 FINDINGS: Lower chest: Mild left basilar and moderate severity right basilar atelectasis and/or infiltrate. There is a small right pleural effusion. Hepatobiliary: No focal liver abnormality is seen. No gallstones, gallbladder wall thickening, or biliary dilatation. Pancreas: Unremarkable. No pancreatic ductal dilatation or surrounding inflammatory changes. Spleen: Normal in size without focal abnormality. Adrenals/Urinary Tract: Adrenal glands are unremarkable. Kidneys are normal, without renal calculi, focal lesion, or hydronephrosis. Bladder is unremarkable. Stomach/Bowel: Stomach is within normal limits. The appendix is not clearly identified. No evidence of bowel dilatation. Mild to moderate severity thickening of the transverse colon is seen. Noninflamed diverticula are seen throughout the large bowel. Vascular/Lymphatic: There is mild calcification of the abdominal aorta and bilateral common iliac arteries, without evidence of aneurysmal dilatation. No enlarged abdominal or pelvic lymph nodes. Reproductive: The uterus is normal in  size and appearance. A 2.0 cm diameter left adnexal cyst is noted. Other: No abdominal wall hernia or abnormality. A moderate amount of abdominal and pelvic free fluid is seen. Musculoskeletal: Moderate to marked severity edema is seen within the lateral and posterolateral aspects of the bilateral abdominal and pelvic wall. Moderate to marked severity degenerative changes seen at the levels of L4-L5 and L5-S1. IMPRESSION: 1. Mild left basilar and moderate severity right basilar atelectasis and/or infiltrate. 2. Small right pleural effusion. 3. Mild to moderate severity thickening of the transverse colon which may represent an infectious or inflammatory colitis. 4. Noninflamed diverticula throughout the large bowel. 5. 2.0 cm diameter left adnexal cyst, likely ovarian in origin. 6. Moderate to marked severity edema within the lateral and posterolateral aspects of the bilateral abdominal and pelvic wall. 7. Aortic atherosclerosis. Aortic Atherosclerosis (ICD10-I70.0). Electronically Signed   By: Aram Candelahaddeus  Houston M.D.   On: 04/19/2020 18:56   US Paracentesis  Result Date: 04/20/2020 INDICATION: Concern for spontaneous bacterial peritonitis EXAM: ULTRASOUND GUIDED DIAGNOSTIC AND THERAPEUTIC PARACENTESIS MEDICATIONS: 1% LIDOCAINE LOCAL COMPLICATIONS: None immediate. PROCEDURE: Informed written consent was obtained from the patient after a discussion of the risks, benefits and alternatives to treatment. A timeout was performed prior to the initiation of the procedure. Initial ultrasound scanning demonstrates a large amount of ascites within the right lower abdominal quadrant. The right lower abdomen was prepped and draped in the usual sterile fashion. 1% lidocaine was used for local anesthesia. Following this, a 6 Fr Safe-T-Centesis catheter was introduced. An ultrasound image was saved for documentation purposes. The paracentesis was performed. The catheter was removed and a dressing was applied. The patient  tolerated the procedure well without immediate post procedural complication. FINDINGS: A total of approximately 1.3 L of clear yellow peritoneal fluid was removed. Samples were sent to the laboratory as requested by the clinical team. IMPRESSION: Successful ultrasound-guided paracentesis yielding 1.3 liters of peritoneal fluid. Electronically Signed   By: Judie PetitM.  Shick M.D.   On: 04/20/2020 11:11   DG Chest Portable 1 View  Result Date: 04/19/2020 CLINICAL DATA:  Shortness of breath. Confusion. Follow-up pneumonia. EXAM: PORTABLE CHEST 1 VIEW COMPARISON:  04/09/2020 FINDINGS: Considerable radiographic improvement. Mild hazy infiltrate persists in the mid and lower lungs. No worsening or new finding. No effusion. Upper lungs remain clear. IMPRESSION: Radiographic improvement. Only mild residual hazy infiltrate persists at the mid and lower lungs. Electronically  Signed   By: Paulina Fusi M.D.   On: 04/19/2020 17:47      Impression / Plan:   Jill Reyes is a 44 y.o. y/o female with alcoholic liver cirrhosis with recent admission now presents with abdominal pain at the site of her previous paracentesis and being treated for presumed SBP  Body fluid cell count shows total nucleated cell count to be 267 however, differentiation was not performed and pathology interpretation is pending  I called the lab today and they state the slides are with the pathologist and they are unable to provide the differentiation themselves  It is difficult to say if her abdominal pain occurred due to SBP itself.  Pain significantly improved after paracentesis and patient states it was similar in nature to the pain she had prior to her previous paracentesis and at that time body fluid cell count of differentiation did not show SBP.  Her abdominal pain may have been due to the amount of fluid in her abdomen, not due to SBP, patient has not had any fever chills. culture has not shown any growth  Patient has been appropriately  started on gram-negative coverage.  Await pathologist smear review to evaluate if there is evidence of SBP.  Okay to continue gram-negative coverage until then.  No evidence of active GI bleeding  Liver enzymes are elevated, but continue to improve compared to last admission patient was diagnosed with alcoholic hepatitis  Continue to encourage abstinence Folate, thiamine  Patient was on prednisone taper from previous admission for alcoholic hepatitis and this has been continued  Thank you for involving me in the care of this patient.      LOS: 2 days   Pasty Spillers, MD  04/21/2020, 2:40 PM

## 2020-04-22 LAB — CBC WITH DIFFERENTIAL/PLATELET
Abs Immature Granulocytes: 0.14 10*3/uL — ABNORMAL HIGH (ref 0.00–0.07)
Basophils Absolute: 0 10*3/uL (ref 0.0–0.1)
Basophils Relative: 0 %
Eosinophils Absolute: 0 10*3/uL (ref 0.0–0.5)
Eosinophils Relative: 0 %
HCT: 28 % — ABNORMAL LOW (ref 36.0–46.0)
Hemoglobin: 9.1 g/dL — ABNORMAL LOW (ref 12.0–15.0)
Immature Granulocytes: 1 %
Lymphocytes Relative: 9 %
Lymphs Abs: 1.4 10*3/uL (ref 0.7–4.0)
MCH: 34.9 pg — ABNORMAL HIGH (ref 26.0–34.0)
MCHC: 32.5 g/dL (ref 30.0–36.0)
MCV: 107.3 fL — ABNORMAL HIGH (ref 80.0–100.0)
Monocytes Absolute: 0.8 10*3/uL (ref 0.1–1.0)
Monocytes Relative: 5 %
Neutro Abs: 12.8 10*3/uL — ABNORMAL HIGH (ref 1.7–7.7)
Neutrophils Relative %: 85 %
Platelets: 139 10*3/uL — ABNORMAL LOW (ref 150–400)
RBC: 2.61 MIL/uL — ABNORMAL LOW (ref 3.87–5.11)
RDW: 18.1 % — ABNORMAL HIGH (ref 11.5–15.5)
WBC: 15.2 10*3/uL — ABNORMAL HIGH (ref 4.0–10.5)
nRBC: 0 % (ref 0.0–0.2)

## 2020-04-22 LAB — COMPREHENSIVE METABOLIC PANEL
ALT: 39 U/L (ref 0–44)
AST: 54 U/L — ABNORMAL HIGH (ref 15–41)
Albumin: 2.5 g/dL — ABNORMAL LOW (ref 3.5–5.0)
Alkaline Phosphatase: 203 U/L — ABNORMAL HIGH (ref 38–126)
Anion gap: 13 (ref 5–15)
BUN: 23 mg/dL — ABNORMAL HIGH (ref 6–20)
CO2: 21 mmol/L — ABNORMAL LOW (ref 22–32)
Calcium: 7.8 mg/dL — ABNORMAL LOW (ref 8.9–10.3)
Chloride: 104 mmol/L (ref 98–111)
Creatinine, Ser: 0.91 mg/dL (ref 0.44–1.00)
GFR calc Af Amer: >60 mL/min (ref >60)
GFR calc non Af Amer: >60 mL/min (ref >60)
Glucose, Bld: 134 mg/dL — ABNORMAL HIGH (ref 70–99)
Potassium: 3.7 mmol/L (ref 3.5–5.1)
Sodium: 138 mmol/L (ref 135–145)
Total Bilirubin: 6 mg/dL — ABNORMAL HIGH (ref 0.3–1.2)
Total Protein: 5.4 g/dL — ABNORMAL LOW (ref 6.5–8.1)

## 2020-04-22 LAB — PATHOLOGIST SMEAR REVIEW

## 2020-04-22 LAB — MAGNESIUM: Magnesium: 1.9 mg/dL (ref 1.7–2.4)

## 2020-04-22 MED ORDER — SODIUM CHLORIDE 0.9 % IV SOLN
INTRAVENOUS | Status: DC | PRN
  Administered 2020-04-22: 250 mL via INTRAVENOUS

## 2020-04-22 MED ORDER — CIPROFLOXACIN HCL 500 MG PO TABS: 500.0000 mg | ORAL_TABLET | Freq: Two times a day (BID) | ORAL | 0 refills | Status: AC

## 2020-04-22 MED ORDER — FUROSEMIDE 20 MG PO TABS
20.0000 mg | ORAL_TABLET | Freq: Every day | ORAL | Status: DC
  Administered 2020-04-23: 20 mg via ORAL
  Filled 2020-04-22: qty 1

## 2020-04-22 MED ORDER — LACTATED RINGERS IV BOLUS
1000.0000 mL | Freq: Once | INTRAVENOUS | Status: AC
  Administered 2020-04-23: 1000 mL via INTRAVENOUS

## 2020-04-22 MED ORDER — LACTULOSE 10 GM/15ML PO SOLN: 20.0000 g | Freq: Two times a day (BID) | ORAL | 0 refills | Status: AC

## 2020-04-22 NOTE — ED Provider Notes (Signed)
[facility] Emergency Department Provider Note  ____________________________________________   First MD Initiated Contact with Patient 04/19/20 1724     (approximate)  I have reviewed the triage vital signs and the nursing notes.   HISTORY  Chief Complaint Abdominal Pain    HPI Jill Reyes is a 44 y.o. female  Here with abdominal pain, nausea and vomiting. Pt has recent histoyr of diagnosis of alcoholic cirrhosis, has been recently admitted and treated. over the last week, she's had worsening nausea, abd pain, and confusion. She reports that she is having difficulty concentrating. Her abd pain is diffuse, aching, worse w/ palpation. No alleviating factors. Denies any ongoing etoh use. No other complaints. No known fever, chills. She has been vomiting, NBNB emesis. No blood.    History reviewed. No pertinent past medical history.  Patient Active Problem List   Diagnosis Date Noted  . SBP (spontaneous bacterial peritonitis) (HCC) 04/19/2020  . Esophageal varices in alcoholic cirrhosis (HCC)   . Portal hypertension (HCC)   . Acute alcoholic liver disease 04/04/2020  . AKI (acute kidney injury) (HCC) 04/04/2020  . Anemia 04/04/2020  . Jaundice   . SIRS due to non-infectious process without acute organ dysfunction (HCC) 01/26/2020  . Claudication of both lower extremities (HCC) 01/26/2020  . Protein-calorie malnutrition, severe (HCC) 01/25/2020  . Hypomagnesemia 01/24/2020  . Nicotine dependence 01/24/2020  . Lung nodule < 6cm on CT 01/24/2020  . Folate deficiency 12/24/2019  . Perianal abscess 12/22/2019  . Hypokalemia 12/22/2019  . Hyponatremia 12/22/2019  . Macrocytosis 12/22/2019  . Elevated AST (SGOT) 12/22/2019  . Colon wall thickening 12/22/2019    Past Surgical History:  Procedure Laterality Date  . CARPAL TUNNEL RELEASE    . CESAREAN SECTION    . ESOPHAGOGASTRODUODENOSCOPY (EGD) WITH PROPOFOL N/A 04/13/2020   Procedure:  ESOPHAGOGASTRODUODENOSCOPY (EGD) WITH PROPOFOL;  Surgeon: Midge Minium, MD;  Location: [facility] ENDOSCOPY;  Service: Endoscopy;  Laterality: N/A;  . HEMORRHOID SURGERY N/A 12/25/2019   Procedure: HEMORRHOIDECTOMY;  Surgeon: Sung Amabile, DO;  Location: [facility] ORS;  Service: General;  Laterality: N/A;  . INCISION AND DRAINAGE PERIRECTAL ABSCESS N/A 12/25/2019   Procedure: IRRIGATION AND DEBRIDEMENT PERIRECTAL ABSCESS;  Surgeon: Sung Amabile, DO;  Location: [facility] ORS;  Service: General;  Laterality: N/A;  . TUBAL LIGATION      Prior to Admission medications   Medication Sig Start Date End Date Taking? Authorizing Provider  fluconazole (DIFLUCAN) 200 MG tablet Take 2 tablets (400 mg total) by mouth daily for 7 days. 04/15/20 04/22/20  Swayze, Ava, DO  folic acid (FOLVITE) 1 MG tablet Take 1 tablet (1 mg total) by mouth daily. 04/15/20   Swayze, Ava, DO  furosemide (LASIX) 20 MG tablet Take 1 tablet (20 mg total) by mouth daily after breakfast. 04/15/20   Swayze, Ava, DO  loratadine (CLARITIN) 10 MG tablet Take 1 tablet (10 mg total) by mouth daily. 12/26/19   Pennie Banter, DO  megestrol (MEGACE) 40 MG tablet Take 1 tablet (40 mg total) by mouth daily. 04/15/20   Swayze, Ava, DO  melatonin 5 MG TABS Take 1 tablet (5 mg total) by mouth at bedtime. 04/14/20   Swayze, Ava, DO  Multiple Vitamin (MULTIVITAMIN WITH MINERALS) TABS tablet Take 1 tablet by mouth daily. 04/15/20   Swayze, Ava, DO  nicotine (NICODERM CQ - DOSED IN MG/24 HOURS) 21 mg/24hr patch Place 1 patch (21 mg total) onto the skin daily. 01/27/20   Dhungel, Theda Belfast, MD  pantoprazole (PROTONIX) 40 MG  tablet Take 1 tablet (40 mg total) by mouth daily. 04/14/20 04/14/21  Swayze, Ava, DO  polyethylene glycol (MIRALAX / GLYCOLAX) 17 g packet Take 17 g by mouth daily. 04/15/20   Swayze, Ava, DO  predniSONE (DELTASONE) 20 MG tablet Take 2 tablets (40 mg total) by mouth daily with breakfast for 3 days, THEN 1.5 tablets (30 mg total) daily with breakfast for 4  days, THEN 1 tablet (20 mg total) daily with breakfast for 4 days, THEN 0.5 tablets (10 mg total) daily with breakfast for 4 days. 04/15/20 04/30/20  Swayze, Ava, DO  rifaximin (XIFAXAN) 550 MG TABS tablet Take 1 tablet (550 mg total) by mouth 2 (two) times daily. 04/14/20   Swayze, Ava, DO  spironolactone (ALDACTONE) 50 MG tablet Take 1 tablet (50 mg total) by mouth daily after breakfast. 04/15/20   Swayze, Ava, DO  thiamine 100 MG tablet Take 1 tablet (100 mg total) by mouth daily. 04/15/20   Swayze, Ava, DO  traMADol (ULTRAM) 50 MG tablet Take 1 tablet (50 mg total) by mouth 3 (three) times daily as needed for moderate pain. 04/14/20   Swayze, Ava, DO    Allergies Aspartame and phenylalanine  History reviewed. No pertinent family history.  Social History Social History   Tobacco Use  . Smoking status: Former Smoker    Packs/day: 1.50    Types: Cigarettes  . Smokeless tobacco: Never Used  . Tobacco comment: last smoke 04/18/20  Vaping Use  . Vaping Use: Never used  Substance Use Topics  . Alcohol use: Yes    Comment: dailhy drinker, 2-3 beers a day, was a former liquor drinker, Last drink 04/02/20  . Drug use: Not Currently    Review of Systems  Review of Systems  Constitutional: Positive for fatigue. Negative for fever.  HENT: Negative for congestion and sore throat.   Eyes: Negative for visual disturbance.  Respiratory: Negative for cough and shortness of breath.   Cardiovascular: Negative for chest pain.  Gastrointestinal: Positive for abdominal pain, nausea and vomiting. Negative for diarrhea.  Genitourinary: Negative for flank pain.  Musculoskeletal: Negative for back pain and neck pain.  Skin: Negative for rash and wound.  Neurological: Positive for weakness.  All other systems reviewed and are negative.    ____________________________________________  PHYSICAL EXAM:      VITAL SIGNS: ED Triage Vitals  Enc Vitals Group     BP 04/19/20 1201 135/88     Pulse Rate  04/19/20 1201 (!) 143     Resp 04/19/20 1201 20     Temp 04/19/20 1201 98.8 F (37.1 C)     Temp Source 04/19/20 1201 Oral     SpO2 04/19/20 1201 96 %     Weight 04/19/20 1202 134 lb 11.2 oz (61.1 kg)     Height 04/19/20 1202 5\' 8"  (1.727 m)     Head Circumference --      Peak Flow --      Pain Score 04/19/20 1202 8     Pain Loc --      Pain Edu? --      Excl. in GC? --      Physical Exam Vitals and nursing note reviewed.  Constitutional:      General: She is not in acute distress.    Appearance: She is well-developed.  HENT:     Head: Normocephalic and atraumatic.  Eyes:     Conjunctiva/sclera: Conjunctivae normal.  Cardiovascular:     Rate and Rhythm: Regular rhythm.  Tachycardia present.     Heart sounds: Normal heart sounds.  Pulmonary:     Effort: Pulmonary effort is normal. No respiratory distress.     Breath sounds: No wheezing.  Abdominal:     General: Abdomen is protuberant. Bowel sounds are decreased. There is distension.     Tenderness: There is abdominal tenderness.  Musculoskeletal:     Cervical back: Neck supple.  Skin:    General: Skin is warm.     Capillary Refill: Capillary refill takes less than 2 seconds.     Findings: No rash.  Neurological:     Mental Status: She is alert and oriented to person, place, and time.     Motor: No abnormal muscle tone.       ____________________________________________   LABS (all labs ordered are listed, but only abnormal results are displayed)  Labs Reviewed  CBC WITH DIFFERENTIAL/PLATELET - Abnormal; Notable for the following components:      Result Value   WBC 17.5 (*)    RBC 2.87 (*)    Hemoglobin 10.0 (*)    HCT 30.4 (*)    MCV 105.9 (*)    MCH 34.8 (*)    RDW 19.1 (*)    Neutro Abs 14.2 (*)    Abs Immature Granulocytes 0.10 (*)    All other components within normal limits  COMPREHENSIVE METABOLIC PANEL - Abnormal; Notable for the following components:   Glucose, Bld 114 (*)    BUN 32 (*)     Creatinine, Ser 1.09 (*)    Total Protein 6.3 (*)    Albumin 3.1 (*)    AST 98 (*)    ALT 64 (*)    Alkaline Phosphatase 243 (*)    Total Bilirubin 8.7 (*)    All other components within normal limits  PROTIME-INR - Abnormal; Notable for the following components:   Prothrombin Time 16.4 (*)    INR 1.4 (*)    All other components within normal limits  LIPASE, BLOOD - Abnormal; Notable for the following components:   Lipase 86 (*)    All other components within normal limits  PROTIME-INR - Abnormal; Notable for the following components:   Prothrombin Time 17.3 (*)    INR 1.5 (*)    All other components within normal limits  BASIC METABOLIC PANEL - Abnormal; Notable for the following components:   Potassium 3.4 (*)    BUN 28 (*)    Calcium 8.4 (*)    All other components within normal limits  CBC - Abnormal; Notable for the following components:   WBC 14.3 (*)    RBC 2.57 (*)    Hemoglobin 9.2 (*)    HCT 27.5 (*)    MCV 107.0 (*)    MCH 35.8 (*)    RDW 19.1 (*)    Platelets 126 (*)    All other components within normal limits  LACTATE DEHYDROGENASE, PLEURAL OR PERITONEAL FLUID - Abnormal; Notable for the following components:   LD, Fluid 71 (*)    All other components within normal limits  BODY FLUID CELL COUNT WITH DIFFERENTIAL - Abnormal; Notable for the following components:   Appearance, Fluid HAZY (*)    All other components within normal limits  COMPREHENSIVE METABOLIC PANEL - Abnormal; Notable for the following components:   Potassium 3.1 (*)    BUN 23 (*)    Calcium 7.5 (*)    Total Protein 4.7 (*)    Albumin 2.2 (*)    AST  59 (*)    Alkaline Phosphatase 164 (*)    Total Bilirubin 6.5 (*)    All other components within normal limits  CBC WITH DIFFERENTIAL/PLATELET - Abnormal; Notable for the following components:   WBC 12.2 (*)    RBC 2.35 (*)    Hemoglobin 8.2 (*)    HCT 24.6 (*)    MCV 104.7 (*)    MCH 34.9 (*)    RDW 18.8 (*)    Platelets 112 (*)     Neutro Abs 9.1 (*)    All other components within normal limits  CBC WITH DIFFERENTIAL/PLATELET - Abnormal; Notable for the following components:   WBC 15.2 (*)    RBC 2.61 (*)    Hemoglobin 9.1 (*)    HCT 28.0 (*)    MCV 107.3 (*)    MCH 34.9 (*)    RDW 18.1 (*)    Platelets 139 (*)    Neutro Abs 12.8 (*)    Abs Immature Granulocytes 0.14 (*)    All other components within normal limits  COMPREHENSIVE METABOLIC PANEL - Abnormal; Notable for the following components:   CO2 21 (*)    Glucose, Bld 134 (*)    BUN 23 (*)    Calcium 7.8 (*)    Total Protein 5.4 (*)    Albumin 2.5 (*)    AST 54 (*)    Alkaline Phosphatase 203 (*)    Total Bilirubin 6.0 (*)    All other components within normal limits  TROPONIN I (HIGH SENSITIVITY) - Abnormal; Notable for the following components:   Troponin I (High Sensitivity) 20 (*)    All other components within normal limits  SARS CORONAVIRUS 2 BY RT PCR (HOSPITAL ORDER, PERFORMED IN [facility])  BODY FLUID CULTURE  ACID FAST CULTURE WITH REFLEXED SENSITIVITIES (MYCOBACTERIA)  ACID FAST SMEAR (AFB, MYCOBACTERIA)  APTT  AMMONIA  PROCALCITONIN  CORTISOL-AM, BLOOD  AMYLASE, PLEURAL OR PERITONEAL FLUID   ALBUMIN, PLEURAL OR PERITONEAL FLUID  PROTEIN, PLEURAL OR PERITONEAL FLUID  MAGNESIUM  PHOSPHORUS  MAGNESIUM  PHOSPHORUS  PATHOLOGIST SMEAR REVIEW  MAGNESIUM  PH, BODY FLUID  PROTEIN, BODY FLUID (OTHER)  TROPONIN I (HIGH SENSITIVITY)    ____________________________________________  EKG: Sinus tachycardia, VR 133. PR 128, QRS 66, QTc 461. No acute St elevation or depression. No ischemia or infarct. ________________________________________  RADIOLOGY All imaging, including plain films, CT scans, and ultrasounds, independently reviewed by me, and interpretations confirmed via formal radiology reads.  ED MD interpretation:   CT Head: NAICA CT A/P: Colitis, no surgical abnormality CXR: Clear  Official  radiology report(s): No results found.  ____________________________________________  PROCEDURES   Procedure(s) performed (including Critical Care):  Procedures  ____________________________________________  INITIAL IMPRESSION / MDM / ASSESSMENT AND PLAN / ED COURSE  As part of my medical decision making, I reviewed the following data within the electronic MEDICAL RECORD NUMBER Nursing notes reviewed and incorporated, Old chart reviewed, Notes from prior ED visits, and [place] Controlled Substance Database       *Carl BestLyndi Minerva was evaluated in Emergency Department on 04/22/2020 for the symptoms described in the history of present illness. She was evaluated in the context of the global COVID-19 pandemic, which necessitated consideration that the patient might be at risk for infection with the SARS-CoV-2 virus that causes COVID-19. Institutional protocols and algorithms that pertain to the evaluation of patients at risk for COVID-19 are in a state of rapid change based on information released by regulatory bodies including  the [employer] and federal and state organizations. These policies and algorithms were followed during the patient's care in the ED.  Some ED evaluations and interventions may be delayed as a result of limited staffing during the pandemic.*     Medical Decision Making:  44 yo F here with nausea, vomiting, tachycardia, confusion. Clinically, concern for SBP vs colitis. Labs show persistent leukocytosis, transaminitis and hyperbilirubinemia, though not particularly worsened from baseline. IV ABX, fluids given and will admit to medicine.  ____________________________________________  FINAL CLINICAL IMPRESSION(S) / ED DIAGNOSES  Final diagnoses:  Colitis  Alcoholic cirrhosis of liver with ascites (HCC)     MEDICATIONS GIVEN DURING THIS VISIT:  Medications  fluconazole (DIFLUCAN) tablet 400 mg (400 mg Oral Given 04/21/20 1714)  rifaximin (XIFAXAN) tablet 550 mg (550 mg Oral Given  04/22/20 0832)  spironolactone (ALDACTONE) tablet 50 mg (50 mg Oral Given 04/22/20 0831)  nicotine (NICODERM CQ - dosed in mg/24 hours) patch 21 mg (21 mg Transdermal Patch Applied 04/22/20 1031)  pantoprazole (PROTONIX) EC tablet 40 mg (40 mg Oral Given 04/22/20 0832)  polyethylene glycol (MIRALAX / GLYCOLAX) packet 17 g (17 g Oral Not Given 04/22/20 0833)  folic acid (FOLVITE) tablet 1 mg (1 mg Oral Given 04/22/20 0833)  melatonin tablet 5 mg (5 mg Oral Given 04/21/20 2122)  multivitamin with minerals tablet 1 tablet (1 tablet Oral Given 04/22/20 0833)  thiamine tablet 100 mg (100 mg Oral Given 04/22/20 0831)  loratadine (CLARITIN) tablet 10 mg (10 mg Oral Given 04/22/20 0832)  enoxaparin (LOVENOX) injection 40 mg (40 mg Subcutaneous Given 04/21/20 2123)  metroNIDAZOLE (FLAGYL) IVPB 500 mg (500 mg Intravenous New Bag/Given 04/22/20 0625)  ketorolac (TORADOL) 15 MG/ML injection 15 mg (has no administration in time range)  traZODone (DESYREL) tablet 25 mg (has no administration in time range)  ondansetron (ZOFRAN) tablet 4 mg (4 mg Oral Given 04/22/20 0227)    Or  ondansetron (ZOFRAN) injection 4 mg ( Intravenous See Alternative 04/22/20 0227)  lactulose (CHRONULAC) 10 GM/15ML solution 20 g (20 g Oral Not Given 04/22/20 0833)  cefTRIAXone (ROCEPHIN) 2 g in sodium chloride 0.9 % 100 mL IVPB (2 g Intravenous New Bag/Given 04/22/20 1031)  traMADol (ULTRAM) tablet 50 mg (50 mg Oral Given 04/22/20 0832)  predniSONE (DELTASONE) tablet 20 mg (20 mg Oral Given 04/22/20 0832)  predniSONE (DELTASONE) tablet 10 mg (has no administration in time range)  furosemide (LASIX) tablet 20 mg (has no administration in time range)  cefTRIAXone (ROCEPHIN) 2 g in sodium chloride 0.9 % 100 mL IVPB (0 g Intravenous Stopped 04/19/20 2107)  metroNIDAZOLE (FLAGYL) IVPB 500 mg (0 mg Intravenous Stopped 04/19/20 2257)  potassium chloride SA (KLOR-CON) CR tablet 40 mEq (40 mEq Oral Given 04/20/20 1504)  potassium chloride SA (KLOR-CON) CR  tablet 40 mEq (40 mEq Oral Given 04/21/20 1329)  predniSONE (DELTASONE) tablet 30 mg (30 mg Oral Given 04/21/20 1711)     ED Discharge Orders    None       Note:  This document was prepared using Dragon voice recognition software and may include unintentional dictation errors.   Shaune Pollack, MD 04/22/20 1233

## 2020-04-22 NOTE — Discharge Summary (Signed)
Physician Discharge Summary  Jill Reyes ZOX:[id number] DOB: 22-Jun-1976 DOA: 04/19/2020  PCP: Patient, No Pcp Per  Admit date: 04/19/2020 Discharge date: 04/22/2020  Discharge disposition: Home   Recommendations for Outpatient Follow-Up:   Follow-up with PCP in 1 week   Discharge Diagnosis:   Principal Problem:   SBP (spontaneous bacterial peritonitis) (HCC) Active Problems:   Esophageal varices in alcoholic cirrhosis (HCC)    Discharge Condition: Stable.  Diet recommendation:  Diet Order            Diet - low sodium heart healthy           Diet 2 gram sodium Room service appropriate? Yes; Fluid consistency: Thin  Diet effective now                   Code Status: Full Code     Hospital Course:   Jill BestLyndi Athens  is a 44 y.o. Caucasian female with a known history of alcoholic liver cirrhosis, portal hypertension with esophageal varices, who presented to the emergency room with acute onset of diffuse abdominal pain that started around the sites where she had paracentesis. She was recently admitted here on 8/5 till 8/16 and managed for hepatic encephalopathy and acute kidney injury, pneumonia, hypokalemia and hyponatremia.  She has been having altered mental status.  She was admitted to the hospital for suspected spontaneous bacterial peritonitis.  She was treated with empiric IV antibiotics.  She was treated with IV Lasix for anasarca.  She was also treated with lactulose and rifaximin for hepatic encephalopathy.  She underwent paracentesis at 1.3 L of yellowish ascitic fluid was removed.  Ascitic fluid total nucleated cell count was 267 but neutrophil count could not be differentiated. Gastroenterologist was consulted to assist with management.  Pathology report did not show any evidence of spontaneous bacterial peritonitis.  Abdominal pain significantly improved after paracentesis, and overall her condition has improved.  She is deemed stable for discharge to home  today.  Discharge plan was discussed with the patient in detail and she verbalized understanding.  She [facility] at [address]   Medical Consultants:   Gastroenterologist, Dr. Maximino Greenlandahiliani   Discharge Exam:    Vitals:   04/22/20 0453 04/22/20 0818 04/22/20 1119 04/22/20 1532  BP: 105/78 115/81  108/79  Pulse: 90 100  99  Resp: 18 15 16 16   Temp: 98.7 F (37.1 C)  98.7 F (37.1 C) 98.5 F (36.9 C)  TempSrc: Oral  Oral Oral  SpO2: 98% 98% 98% 98%  Weight:      Height:         GEN: NAD SKIN: Multiple bruises on bilateral upper extremities.  Jaundice EYES: EOMI, icteric ENT: MMM CV: RRR PULM: CTA B ABD: soft, ND, mild tenderness at RLQ ( paracentesis puncture site), +BS CNS: AAO x 3, non focal EXT: No edema or tenderness   The results of significant diagnostics from this hospitalization (including imaging, microbiology, ancillary and laboratory) are listed below for reference.     Procedures and Diagnostic Studies:   CT ABDOMEN PELVIS WO CONTRAST  Result Date: 04/19/2020 CLINICAL DATA:  Abdominal pain and fever. EXAM: CT ABDOMEN AND PELVIS WITHOUT CONTRAST TECHNIQUE: Multidetector CT imaging of the abdomen and pelvis was performed following the standard protocol without IV contrast. COMPARISON:  December 22, 2019 FINDINGS: Lower chest: Mild left basilar and moderate severity right basilar atelectasis and/or infiltrate. There is a small right pleural effusion. Hepatobiliary: No focal liver  abnormality is seen. No gallstones, gallbladder wall thickening, or biliary dilatation. Pancreas: Unremarkable. No pancreatic ductal dilatation or surrounding inflammatory changes. Spleen: Normal in size without focal abnormality. Adrenals/Urinary Tract: Adrenal glands are unremarkable. Kidneys are normal, without renal calculi, focal lesion, or hydronephrosis. Bladder is unremarkable. Stomach/Bowel: Stomach is within normal limits. The  appendix is not clearly identified. No evidence of bowel dilatation. Mild to moderate severity thickening of the transverse colon is seen. Noninflamed diverticula are seen throughout the large bowel. Vascular/Lymphatic: There is mild calcification of the abdominal aorta and bilateral common iliac arteries, without evidence of aneurysmal dilatation. No enlarged abdominal or pelvic lymph nodes. Reproductive: The uterus is normal in size and appearance. A 2.0 cm diameter left adnexal cyst is noted. Other: No abdominal wall hernia or abnormality. A moderate amount of abdominal and pelvic free fluid is seen. Musculoskeletal: Moderate to marked severity edema is seen within the lateral and posterolateral aspects of the bilateral abdominal and pelvic wall. Moderate to marked severity degenerative changes seen at the levels of L4-L5 and L5-S1. IMPRESSION: 1. Mild left basilar and moderate severity right basilar atelectasis and/or infiltrate. 2. Small right pleural effusion. 3. Mild to moderate severity thickening of the transverse colon which may represent an infectious or inflammatory colitis. 4. Noninflamed diverticula throughout the large bowel. 5. 2.0 cm diameter left adnexal cyst, likely ovarian in origin. 6. Moderate to marked severity edema within the lateral and posterolateral aspects of the bilateral abdominal and pelvic wall. 7. Aortic atherosclerosis. Aortic Atherosclerosis (ICD10-I70.0). Electronically Signed   By: Aram Candela M.D.   On: 04/19/2020 18:56   CT Head Wo Contrast  Result Date: 04/19/2020 CLINICAL DATA:  Mental status change. EXAM: CT HEAD WITHOUT CONTRAST TECHNIQUE: Contiguous axial images were obtained from the base of the skull through the vertex without intravenous contrast. COMPARISON:  CT head 04/06/2020 FINDINGS: Brain: Mild atrophy without hydrocephalus. Negative for acute infarct, hemorrhage, mass Vascular: Negative for hyperdense vessel Skull: Negative Sinuses/Orbits: Paranasal  sinuses clear.  Negative orbit Other: None IMPRESSION: No acute abnormality and  no interval change.  Generalized atrophy. Electronically Signed   By: Marlan Palau M.D.   On: 04/19/2020 12:50   US Paracentesis  Result Date: 04/20/2020 INDICATION: Concern for spontaneous bacterial peritonitis EXAM: ULTRASOUND GUIDED DIAGNOSTIC AND THERAPEUTIC PARACENTESIS MEDICATIONS: 1% LIDOCAINE LOCAL COMPLICATIONS: None immediate. PROCEDURE: Informed written consent was obtained from the patient after a discussion of the risks, benefits and alternatives to treatment. A timeout was performed prior to the initiation of the procedure. Initial ultrasound scanning demonstrates a large amount of ascites within the right lower abdominal quadrant. The right lower abdomen was prepped and draped in the usual sterile fashion. 1% lidocaine was used for local anesthesia. Following this, a 6 Fr Safe-T-Centesis catheter was introduced. An ultrasound image was saved for documentation purposes. The paracentesis was performed. The catheter was removed and a dressing was applied. The patient tolerated the procedure well without immediate post procedural complication. FINDINGS: A total of approximately 1.3 L of clear yellow peritoneal fluid was removed. Samples were sent to the laboratory as requested by the clinical team. IMPRESSION: Successful ultrasound-guided paracentesis yielding 1.3 liters of peritoneal fluid. Electronically Signed   By: Judie Petit.  Shick M.D.   On: 04/20/2020 11:11   DG Chest Portable 1 View  Result Date: 04/19/2020 CLINICAL DATA:  Shortness of breath. Confusion. Follow-up pneumonia. EXAM: PORTABLE CHEST 1 VIEW COMPARISON:  04/09/2020 FINDINGS: Considerable radiographic improvement. Mild hazy infiltrate persists in the mid and lower lungs.  No worsening or new finding. No effusion. Upper lungs remain clear. IMPRESSION: Radiographic improvement. Only mild residual hazy infiltrate persists at the mid and lower lungs.  Electronically Signed   By: Paulina Fusi M.D.   On: 04/19/2020 17:47     Labs:   Basic Metabolic Panel: Recent Labs  Lab 04/19/20 1214 04/19/20 1214 04/20/20 0419 04/20/20 0419 04/21/20 0420 04/22/20 1000  NA 142  --  144  --  141 138  K 4.2   < > 3.4*   < > 3.1* 3.7  CL 105  --  107  --  107 104  CO2 26  --  25  --  23 21*  GLUCOSE 114*  --  77  --  87 134*  BUN 32*  --  28*  --  23* 23*  CREATININE 1.09*  --  0.90  --  0.84 0.91  CALCIUM 8.9  --  8.4*  --  7.5* 7.8*  MG  --   --  2.2  --  1.9 1.9  PHOS  --   --  3.3  --  2.7  --    < > = values in this interval not displayed.   GFR Estimated Creatinine Clearance: 81.2 mL/min (by C-G formula based on SCr of 0.91 mg/dL). Liver Function Tests: Recent Labs  Lab 04/19/20 1214 04/21/20 0420 04/22/20 1000  AST 98* 59* 54*  ALT 64* 38 39  ALKPHOS 243* 164* 203*  BILITOT 8.7* 6.5* 6.0*  PROT 6.3* 4.7* 5.4*  ALBUMIN 3.1* 2.2* 2.5*   Recent Labs  Lab 04/19/20 1214  LIPASE 86*   Recent Labs  Lab 04/19/20 1214  AMMONIA 33   Coagulation profile Recent Labs  Lab 04/19/20 1214 04/20/20 0419  INR 1.4* 1.5*    CBC: Recent Labs  Lab 04/19/20 1214 04/20/20 0419 04/21/20 0420 04/22/20 1000  WBC 17.5* 14.3* 12.2* 15.2*  NEUTROABS 14.2*  --  9.1* 12.8*  HGB 10.0* 9.2* 8.2* 9.1*  HCT 30.4* 27.5* 24.6* 28.0*  MCV 105.9* 107.0* 104.7* 107.3*  PLT 171 126* 112* 139*   Cardiac Enzymes: No results for input(s): CKTOTAL, CKMB, CKMBINDEX, TROPONINI in the last 168 hours. BNP: Invalid input(s): POCBNP CBG: No results for input(s): GLUCAP in the last 168 hours. D-Dimer No results for input(s): DDIMER in the last 72 hours. Hgb A1c No results for input(s): HGBA1C in the last 72 hours. Lipid Profile No results for input(s): CHOL, HDL, LDLCALC, TRIG, CHOLHDL, LDLDIRECT in the last 72 hours. Thyroid function studies No results for input(s): TSH, T4TOTAL, T3FREE, THYROIDAB in the last 72 hours.  Invalid input(s):  FREET3 Anemia work up No results for input(s): VITAMINB12, FOLATE, FERRITIN, TIBC, IRON, RETICCTPCT in the last 72 hours. Microbiology Recent Results (from the past 240 hour(s))  SARS Coronavirus 2 by RT PCR (hospital order, performed in [facility] hospital lab) Nasopharyngeal Nasopharyngeal Swab     Status: None   Collection Time: 04/19/20  7:05 PM   Specimen: Nasopharyngeal Swab  Result Value Ref Range Status   SARS Coronavirus 2 NEGATIVE NEGATIVE Final    Comment: (NOTE) SARS-CoV-2 target nucleic acids are NOT DETECTED.  The SARS-CoV-2 RNA is generally detectable in upper and lower respiratory specimens during the acute phase of infection. The lowest concentration of SARS-CoV-2 viral copies this assay can detect is 250 copies / mL. A negative result does not preclude SARS-CoV-2 infection and should not be used as the sole basis for treatment or other patient management decisions.  A negative result may occur with improper specimen collection / handling, submission of specimen other than nasopharyngeal swab, presence of viral mutation(s) within the areas targeted by this assay, and inadequate number of viral copies (<250 copies / mL). A negative result must be combined with clinical observations, patient history, and epidemiological information.  Fact Sheet for Patients:   BoilerBrush.com.cy  Fact Sheet for Healthcare Providers: https://pope.com/  This test is not yet approved or  cleared by the [place] FDA and has been authorized for detection and/or diagnosis of SARS-CoV-2 by FDA under an Emergency Use Authorization (EUA).  This EUA will remain in effect (meaning this test can be used) for the duration of the COVID-19 declaration under Section 564(b)(1) of the Act, 21 U.S.C. section 360bbb-3(b)(1), unless the authorization is terminated or revoked sooner.  Performed at [facility], [address]  [place], [place] [address]   Body fluid culture     Status: None (Preliminary result)   Collection Time: 04/20/20 10:30 AM   Specimen: Peritoneal Washings  Result Value Ref Range Status   Specimen Description PERITONEAL FLUID  Final   Special Requests NONE  Final   Gram Stain NO WBC SEEN NO ORGANISMS SEEN CYTOSPIN SMEAR   Final   Culture   Final    NO GROWTH 2 DAYS Performed at [facility], [address], [place], [place] [address]    Report Status PENDING  Incomplete     Discharge Instructions:   Discharge Instructions    Diet - low sodium heart healthy   Complete by: As directed    Discharge instructions   Complete by: As directed    Avoid alcohol, cigarettes/ tobacco or any illicit drugs   Face-to-face encounter (required for Medicare/Medicaid patients)   Complete by: As directed    I Charlie Char certify that this patient is under my care and that I, or a nurse practitioner or physician's assistant working with me, had a face-to-face encounter that meets the physician face-to-face encounter requirements with this patient on 04/22/2020. The encounter with the patient was in whole, or iLiver cirrhosis with ascites, alcoholic liver hepatitis, debilityn part for the following medical condition(s) which is the primary reason for home health care (List medical condition):   The encounter with the patient was in whole, or in part, for the following medical condition, which is the primary reason for home health care: Liver cirrhosis with ascites, alcoholic liver hepatitis, debility   I certify that, based on my findings, the following services are medically necessary home health services: Physical therapy   Reason for Medically Necessary Home Health Services: Therapy- Investment banker, operational, Patent examiner   My clinical findings support the need for the above services: Unsafe ambulation due to balance issues   Further, I certify that my clinical findings support that  this patient is homebound due to: Unsafe ambulation due to balance issues   For home use only DME 3 n 1   Complete by: As directed    Home Health   Complete by: As directed    To provide the following care/treatments:  PT OT     Increase activity slowly   Complete by: As directed      Allergies as of 04/22/2020      Reactions   Aspartame And Phenylalanine Shortness Of Breath      Medication List    STOP taking these medications   fluconazole 200 MG tablet Commonly known as: DIFLUCAN  TAKE these medications   ciprofloxacin 500 MG tablet Commonly known as: Cipro Take 1 tablet (500 mg total) by mouth 2 (two) times daily for 3 days.   folic acid 1 MG tablet Commonly known as: FOLVITE Take 1 tablet (1 mg total) by mouth daily.   furosemide 20 MG tablet Commonly known as: LASIX Take 1 tablet (20 mg total) by mouth daily after breakfast.   lactulose 10 GM/15ML solution Commonly known as: CHRONULAC Take 30 mLs (20 g total) by mouth 2 (two) times daily.   loratadine 10 MG tablet Commonly known as: CLARITIN Take 1 tablet (10 mg total) by mouth daily.   megestrol 40 MG tablet Commonly known as: MEGACE Take 1 tablet (40 mg total) by mouth daily.   melatonin 5 MG Tabs Take 1 tablet (5 mg total) by mouth at bedtime.   multivitamin with minerals Tabs tablet Take 1 tablet by mouth daily.   nicotine 21 mg/24hr patch Commonly known as: NICODERM CQ - dosed in mg/24 hours Place 1 patch (21 mg total) onto the skin daily.   pantoprazole 40 MG tablet Commonly known as: Protonix Take 1 tablet (40 mg total) by mouth daily.   polyethylene glycol 17 g packet Commonly known as: MIRALAX / GLYCOLAX Take 17 g by mouth daily.   predniSONE 20 MG tablet Commonly known as: DELTASONE Take 2 tablets (40 mg total) by mouth daily with breakfast for 3 days, THEN 1.5 tablets (30 mg total) daily with breakfast for 4 days, THEN 1 tablet (20 mg total) daily with breakfast for 4 days, THEN  0.5 tablets (10 mg total) daily with breakfast for 4 days. Start taking on: April 15, 2020   rifaximin 550 MG Tabs tablet Commonly known as: XIFAXAN Take 1 tablet (550 mg total) by mouth 2 (two) times daily.   spironolactone 50 MG tablet Commonly known as: ALDACTONE Take 1 tablet (50 mg total) by mouth daily after breakfast.   thiamine 100 MG tablet Take 1 tablet (100 mg total) by mouth daily.   traMADol 50 MG tablet Commonly known as: ULTRAM Take 1 tablet (50 mg total) by mouth 3 (three) times daily as needed for moderate pain.            Durable Medical Equipment  (From admission, onward)         Start     Ordered   04/22/20 0000  For home use only DME 3 n 1        04/22/20 1643            Time coordinating discharge: 35 minutes  Signed:  Dimitri Dsouza  [facility] 04/22/2020, 4:43 PM   Pager on [email or web address]. If 7PM-7AM, please contact night-coverage at [email or web address]

## 2020-04-22 NOTE — Progress Notes (Signed)
Pathology smear review results do not show any increase in neutrophils  Therefore, it is not convincing that patient had SBP given absence of fever or chills, significant resolution of symptoms after paracentesis, indicating abdominal pain and discomfort being from fluid retention more than from SBP.  Symptoms were similar at the time of previous paracentesis and cell count differential at that time was not consistent with SBP  Reasonable to complete 3 to 5 days of antibiotic course since patient is already been started on antibiotics.  However, she would not need SBP prophylaxis as based on pathology smear review and culture results there is no convincing evidence of SBP

## 2020-04-22 NOTE — TOC Initial Note (Signed)
Transition of Care [facility]) - Initial/Assessment Note    Patient Details  Name: Jill Reyes MRN: [id number] Date of Birth: 06/10/1976  Transition of [facility]) CM/SW Contact:    Trenton Founds, RN Phone Number: 04/22/2020, 4:07 PM  Clinical Narrative:    RNCM reached out to patient by telephone to discuss discharge needs. Patient reports that nothing has changed in her living situation since the last time she was here. Patient is agreeable to 3N1 recommended by therapy as well as trying to get set up with charity home health.  RNCM reached out to [facility] with [facility] and he will supply the 3N1.  RNCM reached out to [place] with [facility] but she reports she is unable to accept charity referral at this time due to unavailability of therapy staffing.         Expected Discharge Plan: Home w Home Health Services     Patient Goals and CMS Choice        Expected Discharge Plan and Services Expected Discharge Plan: Home w Home Health Services       Living arrangements for the past 2 months: Single Family Home Expected Discharge Date: 04/22/20               DME Arranged: 3-N-1 DME Agency: [facility] Date DME Agency Contacted: 04/22/20 Time DME Agency Contacted: [phone] Representative spoke with at DME Agency: Zack            Prior Living Arrangements/Services Living arrangements for the past 2 months: Single Family Home Lives with:: Spouse Patient language and need for interpreter reviewed:: Yes        Need for Family Participation in Patient Care: Yes (Comment) Care giver support system in place?: Yes (comment)   Criminal Activity/Legal Involvement Pertinent to Current Situation/Hospitalization: No - Comment as needed  Activities of Daily Living Home Assistive Devices/Equipment: Walker (specify type) ADL Screening (condition at time of admission) Patient's cognitive ability adequate to safely complete daily activities?: Yes Is the patient deaf or have difficulty hearing?:  No Does the patient have difficulty seeing, even when wearing glasses/contacts?: No Does the patient have difficulty concentrating, remembering, or making decisions?: Yes Patient able to express need for assistance with ADLs?: Yes Does the patient have difficulty dressing or bathing?: Yes Independently performs ADLs?: No Communication: Independent Dressing (OT): Needs assistance Is this a change from baseline?: Pre-admission baseline Grooming: Needs assistance Is this a change from baseline?: Pre-admission baseline Feeding: Independent Bathing: Needs assistance Is this a change from baseline?: Pre-admission baseline Toileting: Needs assistance Is this a change from baseline?: Pre-admission baseline In/Out Bed: Needs assistance Is this a change from baseline?: Pre-admission baseline Walks in Home: Needs assistance Is this a change from baseline?: Pre-admission baseline Does the patient have difficulty walking or climbing stairs?: Yes Weakness of Legs: Both Weakness of Arms/Hands: Both  Permission Sought/Granted                  Emotional Assessment   Attitude/Demeanor/Rapport: Engaged Affect (typically observed): Appropriate Orientation: : Oriented to Self, Oriented to Place, Oriented to  Time, Oriented to Situation Alcohol / Substance Use: Not Applicable Psych Involvement: No (comment)  Admission diagnosis:  Colitis [K52.9] SBP (spontaneous bacterial peritonitis) (HCC) [K65.2] Alcoholic cirrhosis of liver with ascites (HCC) [K70.31] Patient Active Problem List   Diagnosis Date Noted  . SBP (spontaneous bacterial peritonitis) (HCC) 04/19/2020  . Esophageal varices in alcoholic cirrhosis (HCC)   . Portal hypertension (HCC)   . Acute alcoholic liver disease 04/04/2020  .  AKI (acute kidney injury) (HCC) 04/04/2020  . Anemia 04/04/2020  . Jaundice   . SIRS due to non-infectious process without acute organ dysfunction (HCC) 01/26/2020  . Claudication of both lower  extremities (HCC) 01/26/2020  . Protein-calorie malnutrition, severe (HCC) 01/25/2020  . Hypomagnesemia 01/24/2020  . Nicotine dependence 01/24/2020  . Lung nodule < 6cm on CT 01/24/2020  . Folate deficiency 12/24/2019  . Perianal abscess 12/22/2019  . Hypokalemia 12/22/2019  . Hyponatremia 12/22/2019  . Macrocytosis 12/22/2019  . Elevated AST (SGOT) 12/22/2019  . Colon wall thickening 12/22/2019   PCP:  Patient, No Pcp Per Pharmacy:   [facility] [address] [place] - [address] [place] [address] Phone: [phone] Fax: [phone]  [facility] [address]), [place] - [address]) [place] [address] Phone: [phone] Fax: [phone]  [facility] [place], [place] - [address] [place] [address] Phone: [phone] Fax: [phone]     Social Determinants of Health (SDOH) Interventions    Readmission Risk Interventions Readmission Risk Prevention Plan 04/05/2020  Transportation Screening Complete  PCP or Specialist Appt within 3-5 Days Complete  HRI or Home Care Consult Not Complete  HRI or Home Care Consult comments Patient has very poor prognosis, likely to become hospice patient prior to discharge  Palliative Care Screening Complete  Medication Review (RN Care Manager) Complete  Some recent data might be hidden

## 2020-04-22 NOTE — Evaluation (Signed)
Physical Therapy Evaluation Patient Details Name: Jill Reyes MRN: [id number] DOB: 08-31-1976 Today's Date: 04/22/2020   History of Present Illness  Jill Reyes is a 43yoF comes to [facility] on 8/20 c diffuse ABD pain near a recent paracentesis access. Pt recently admitted here 8/5-8/16 c encephalopathy/PNA. Pt admitted c suspected spontaneous bacterial pertinoitis. Pt underwent US guided parecentewsis 8/21 c 1.3L fluid removal. PMH: ETOH cirrhosis, portal HTN, esophageal varices, LEE.  Clinical Impression  Pt admitted with above diagnosis. Pt currently with functional limitations due to the deficits listed below (see "PT Problem List"). Upon entry, pt in bed, awake, and very motivated to participate. The pt is alert and oriented x4, pleasant, conversational, and generally a good historian. ModA for bed mobility, min-maxA for transfers depending on surface height, minGuard assist for AMB in hall with RW, taking breaks as needed. Husband educated onsafe/ergonomic ways to assist with transfers at home. Discussed use of BSC for toileting, bathing, and meals for elevated surface height in home. Pt remains mildly tachy (100-120s) and dizzy (orthos negative) but paces herself somewhat well. Functional mobility assessment demonstrates increased effort/time requirements, poor tolerance, and need for physical assistance, whereas the patient performed these at a higher level of independence PTA. Pt will benefit from skilled PT intervention to increase independence and safety with basic mobility in preparation for discharge to the venue listed below.       Follow Up Recommendations Home health PT    Equipment Recommendations  3in1 (PT)    Recommendations for Other Services       Precautions / Restrictions Precautions Precautions: Fall Restrictions Weight Bearing Restrictions: No      Mobility  Bed Mobility Overal bed mobility: Needs Assistance Bed Mobility: Supine to Sit;Sit to Supine     Supine  to sit: Supervision Sit to supine: Min assist   General bed mobility comments: cues for OOB, needs help with legs into bed  Transfers Overall transfer level: Needs assistance Equipment used: Rolling walker (2 wheeled) Transfers: Sit to/from Stand Sit to Stand: Min assist;From elevated surface         General transfer comment: quite fearful of falls, which limits forward lean during transfers; pt offered Bilat knee block; gets anxious with 5xSTS, becomes more tachy into 120s  Ambulation/Gait Ambulation/Gait assistance: Min guard Gait Distance (Feet): 130 Feet Assistive device: Rolling walker (2 wheeled) Gait Pattern/deviations: Decreased step length - left;Decreased step length - right     General Gait Details: able to progress RW continuously, has fluctuating dizziness with AMB; negative orthos after session; pauses several times to rest  Stairs            Wheelchair Mobility    Modified Rankin (Stroke Patients Only)       Balance Overall balance assessment: Needs assistance Sitting-balance support: Feet supported;Single extremity supported Sitting balance-Leahy Scale: Fair Sitting balance - Comments: difficulty with confidence seated;   Standing balance support: Single extremity supported;During functional activity Standing balance-Leahy Scale: Fair                               Pertinent Vitals/Pain Pain Assessment: 0-10 Pain Score: 4  Pain Location: ABD Pain Intervention(s): Limited activity within patient's tolerance;Monitored during session    Home Living Family/patient expects to be discharged to:: Private residence Living Arrangements: Parent (spouse, mother availabel as needed 24/7) Available Help at Discharge: Family;Available 24 hours/day Type of Home: House Home Access: Ramped entrance Entrance Stairs-Rails: None Entrance  Stairs-Number of Steps: at least one step at front Home Layout: One level Home Equipment: Walker - 2 wheels       Prior Function Level of Independence: Independent         Comments: Pt reports chronic mobility difficulty due to LEE and ABD distention last seevral weeks, previously no difficult with mobility.     Hand Dominance   Dominant Hand: Right    Extremity/Trunk Assessment   Upper Extremity Assessment Upper Extremity Assessment: Overall WFL for tasks assessed    Lower Extremity Assessment Lower Extremity Assessment: Generalized weakness    Cervical / Trunk Assessment Cervical / Trunk Assessment: Normal  Communication   Communication: No difficulties  Cognition Arousal/Alertness: Awake/alert Behavior During Therapy: WFL for tasks assessed/performed Overall Cognitive Status: Within Functional Limits for tasks assessed                                        General Comments      Exercises Other Exercises Other Exercises: 5xSTS from elevated surface   Assessment/Plan    PT Assessment Patient needs continued PT services  PT Problem List Decreased strength;Decreased activity tolerance;Decreased balance;Decreased mobility;Decreased coordination;Decreased cognition;Cardiopulmonary status limiting activity;Pain       PT Treatment Interventions DME instruction;Gait training;Stair training;Functional mobility training;Therapeutic activities;Therapeutic exercise;Balance training;Patient/family education    PT Goals (Current goals can be found in the Care Plan section)  Acute Rehab PT Goals Patient Stated Goal: To have less pain and get stronger PT Goal Formulation: With patient Time For Goal Achievement: 05/06/20 Potential to Achieve Goals: Fair    Frequency Min 2X/week   Barriers to discharge Inaccessible home environment      Co-evaluation               AM-PAC PT "6 Clicks" Mobility  Outcome Measure Help needed turning from your back to your side while in a flat bed without using bedrails?: A Lot Help needed moving from lying on your back  to sitting on the side of a flat bed without using bedrails?: A Lot Help needed moving to and from a bed to a chair (including a wheelchair)?: A Lot Help needed standing up from a chair using your arms (e.g., wheelchair or bedside chair)?: A Lot Help needed to walk in hospital room?: A Little Help needed climbing 3-5 steps with a railing? : A Little 6 Click Score: 14    End of Session Equipment Utilized During Treatment: Gait belt Activity Tolerance: Patient limited by fatigue;Patient tolerated treatment well;Treatment limited secondary to medical complications (Comment) Patient left: in bed;with family/visitor present;with call bell/phone within reach;with bed alarm set Nurse Communication: Mobility status PT Visit Diagnosis: Unsteadiness on feet (R26.81);Muscle weakness (generalized) (M62.81);History of falling (Z91.81)    Time: 2458-0998 PT Time Calculation (min) (ACUTE ONLY): 50 min   Charges:   PT Evaluation $PT Eval Moderate Complexity: 1 Mod PT Treatments $Gait Training: 8-22 mins $Therapeutic Exercise: 8-22 mins        12:38 PM, 04/22/20 Rosamaria Lints, PT, DPT Physical Therapist - [facility]  [phone] (ASCOM)   Naia Ruff C 04/22/2020, 12:32 PM

## 2020-04-23 LAB — ACID FAST SMEAR (AFB, MYCOBACTERIA): Acid Fast Smear: NEGATIVE

## 2020-04-23 LAB — PH, BODY FLUID: pH, Body Fluid: 7.8

## 2020-04-23 LAB — PROTEIN, BODY FLUID (OTHER): Total Protein, Body Fluid Other: 1.3 g/dL

## 2020-04-23 NOTE — Progress Notes (Signed)
Patient is stable and ready for discharge home. Patient's IV removed. Writer went over discharge paperwork with patient and husband at bedside and both verbalized understanding information provided and had no questions. Patient's belongings packed by spouse and took belongings to the car. Patient transported via [facility] by NT to patient's car with spouse.

## 2020-04-23 NOTE — Discharge Summary (Addendum)
Patient was seen this morning. Order was placed for discharge yesterday.  There was no note from nursing staff as to why patient did not leave yesterday.  When patient was asked why she did not leave yesterday, she said "I don't know".  Her husband was at the bedside and she said it has something to do with "financial something". 3 in 1 commode has been delivered to the bedside.  Patient is medically stable for discharge.  Please see discharge summary on 04/22/2020 for details.

## 2020-04-24 LAB — BODY FLUID CULTURE
Culture: NO GROWTH
Gram Stain: NONE SEEN

## 2020-06-04 LAB — ACID FAST CULTURE WITH REFLEXED SENSITIVITIES (MYCOBACTERIA): Acid Fast Culture: NEGATIVE

## 2020-07-01 DEATH — deceased

## 2022-03-06 IMAGING — CT CT HEAD W/O CM
5 of 6 series · 15 of 47 positions shown, 16 images · non-contrast
Comparison: No pertinent prior exams are available for comparison.

CLINICAL DATA: Mental status change, unknown cause.

EXAM:
CT HEAD WITHOUT CONTRAST
TECHNIQUE: Contiguous axial images were obtained from the base of the skull
through the vertex without intravenous contrast.

[Series 2: head bone · axial · 0.47mm/px · z∈[-150,-108]mm · 4 of 72 slices shown]
[im 8/72  bone]
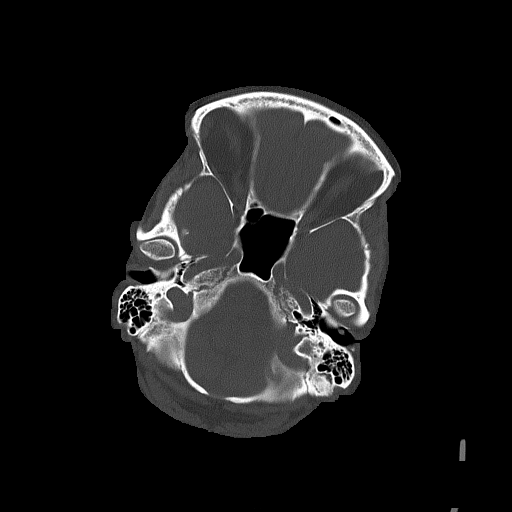
[im 15/72  bone]
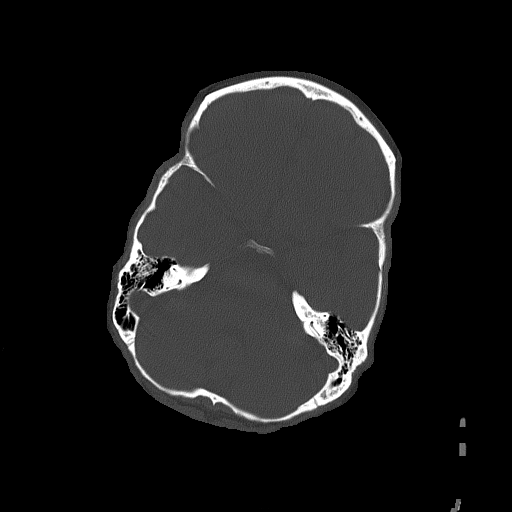
[im 22/72  bone]
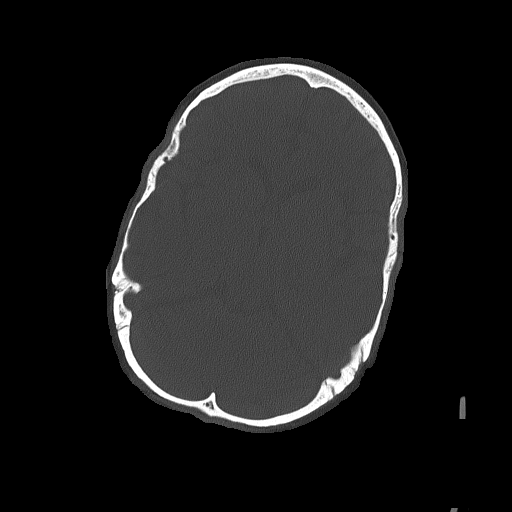
[im 29/72  bone]
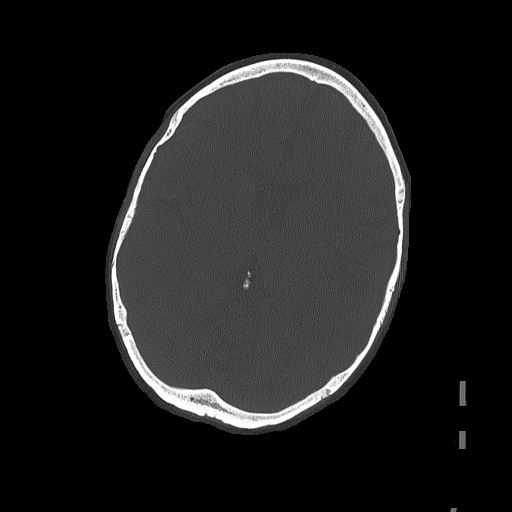

[Series 3: coronal soft tissue · coronal · 0.27mm/px · 3 of 69 slices shown]
[im 23/69  brain]
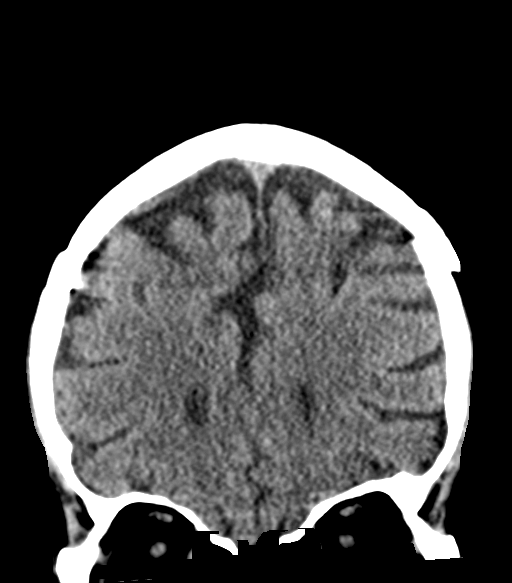
[im 31/69  brain]
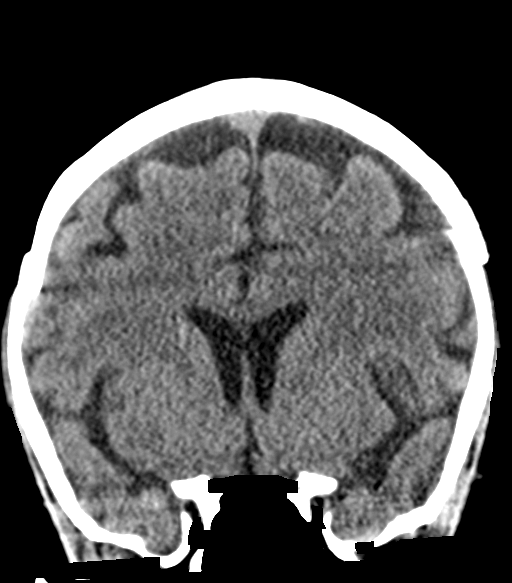
[im 38/69  brain]
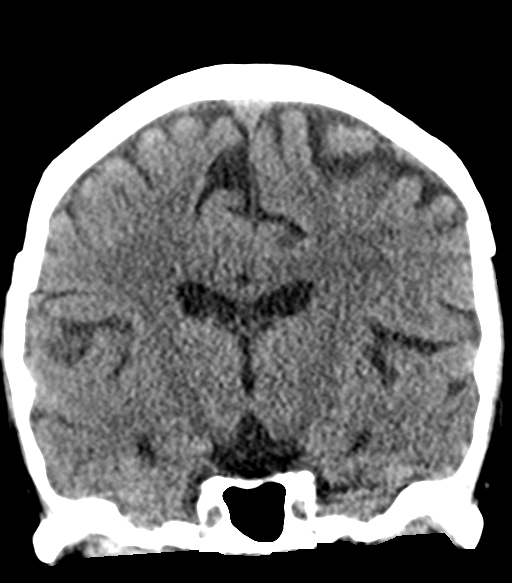

[Series 4: ax head wo · axial · 0.30mm/px · z∈[-128,-54]mm · 3 of 31 slices shown, 4 images (1 of 2)]
[im 8/31  brain]
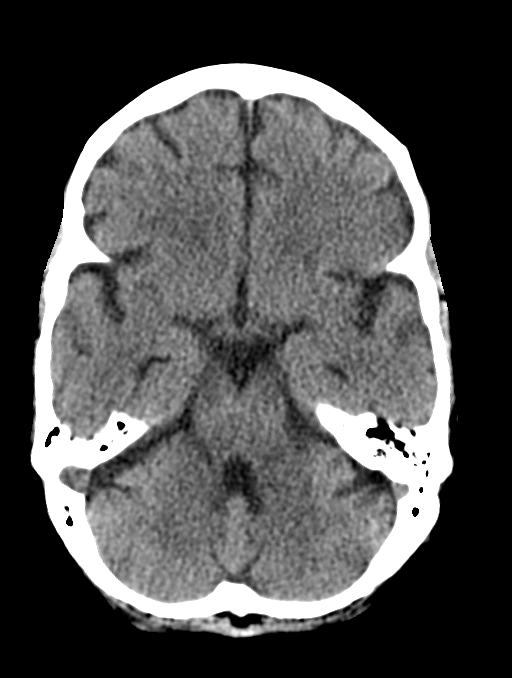
[im 8/31  bone]
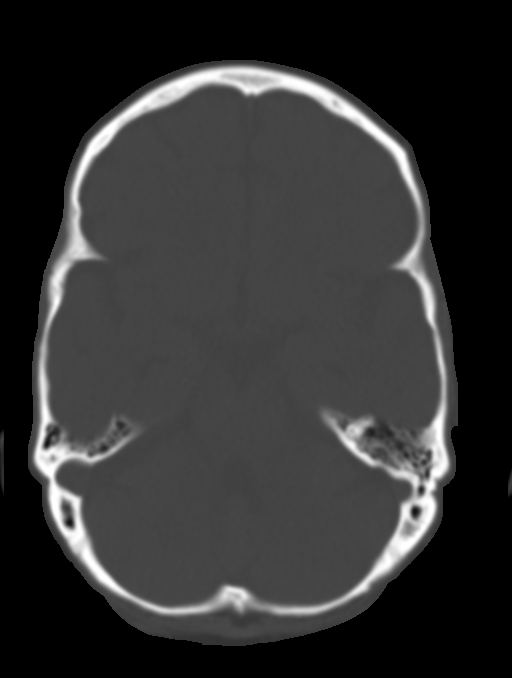
[im 16/31  brain]
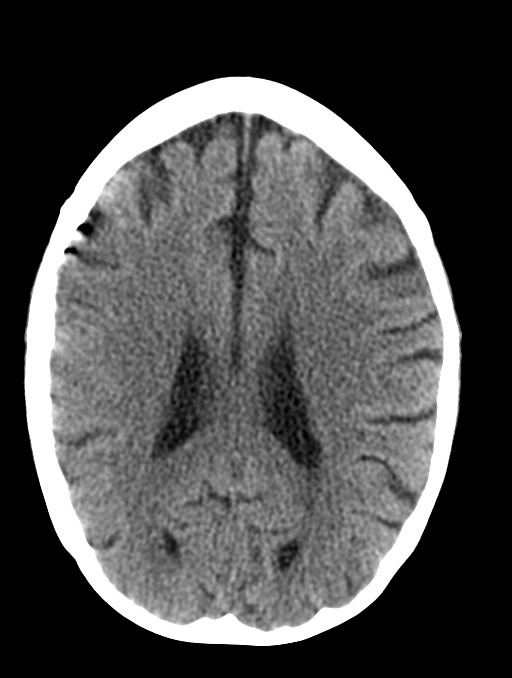
[im 23/31  brain]
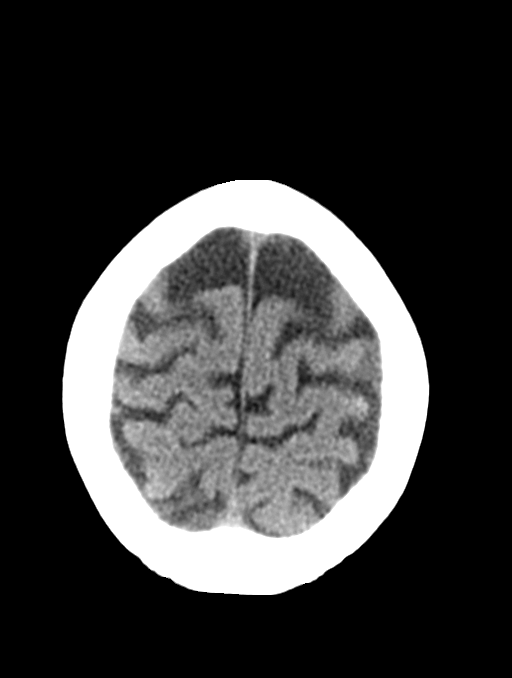

[Series 5: sagittal soft tissue · sagittal · 0.30mm/px · 3 of 46 slices shown]
[im 16/46  brain]
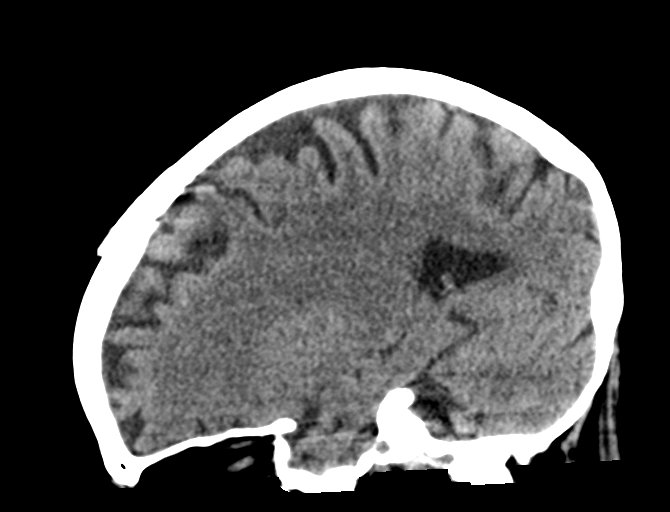
[im 23/46  brain]
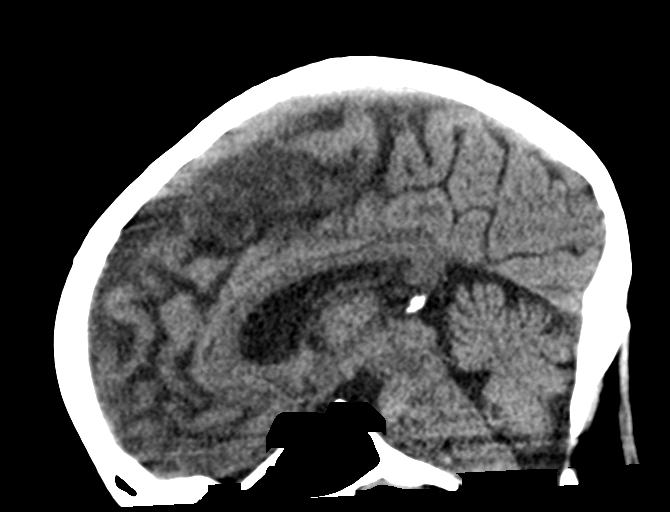
[im 31/46  brain]
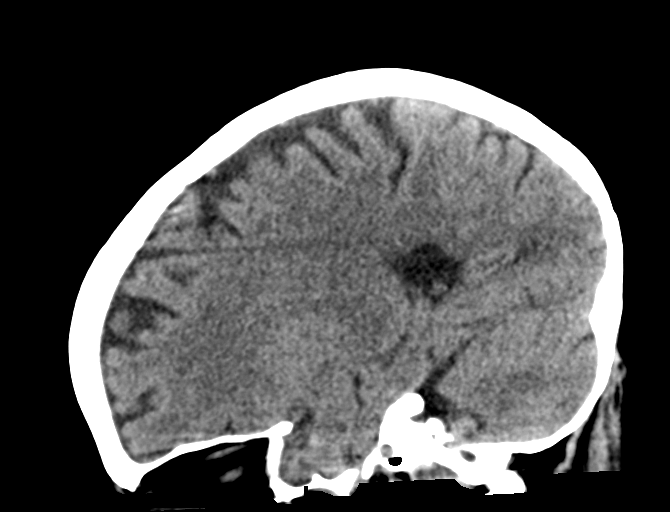

[Series 6: ax head wo · axial · 0.31mm/px · z∈[-123,-80]mm · 2 of 27 slices shown (2 of 2)]
[im 9/27  brain]
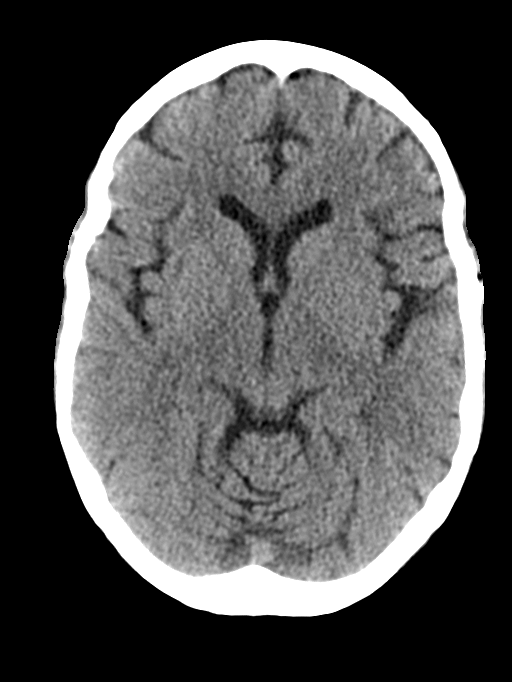
[im 18/27  brain]
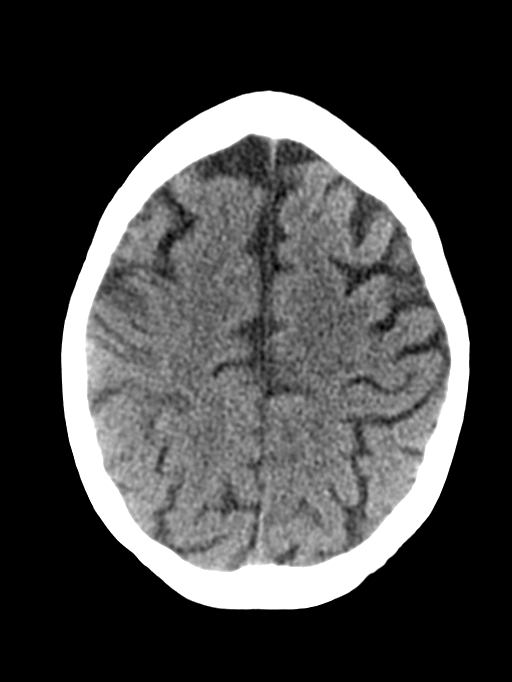

[15 of 47 positions shown; findings below may reference images not displayed]

FINDINGS: Brain:

Mild generalized parenchymal atrophy.

There is no acute intracranial hemorrhage.

No demarcated cortical infarct is identified.

No extra-axial fluid collection.

No evidence of intracranial mass.

No midline shift.

Vascular: No hyperdense vessel.

Skull: Normal. Negative for fracture or focal lesion.

Sinuses/Orbits: Visualized orbits show no acute finding. No
significant paranasal sinus disease or mastoid effusion at the
imaged levels.
IMPRESSION: No CT evidence of acute intracranial abnormality.

Mild generalized parenchymal atrophy.
